# Patient Record
Sex: Female | Born: 1935
Health system: Southern US, Community
[De-identification: ages and names within clinical notes are randomized; demographics above are authoritative.]

## PROBLEM LIST (undated history)

## (undated) DIAGNOSIS — N189 Chronic kidney disease, unspecified: Secondary | ICD-10-CM

## (undated) DIAGNOSIS — H5440 Blindness, one eye, unspecified eye: Secondary | ICD-10-CM

## (undated) DIAGNOSIS — H3500 Unspecified background retinopathy: Secondary | ICD-10-CM

## (undated) DIAGNOSIS — H269 Unspecified cataract: Secondary | ICD-10-CM

## (undated) DIAGNOSIS — E119 Type 2 diabetes mellitus without complications: Secondary | ICD-10-CM

## (undated) DIAGNOSIS — I10 Essential (primary) hypertension: Secondary | ICD-10-CM

## (undated) HISTORY — PX: ABDOMINAL SURGERY: SHX537

## (undated) HISTORY — DX: Unspecified cataract: H26.9

## (undated) HISTORY — DX: Essential (primary) hypertension: I10

## (undated) HISTORY — DX: Chronic kidney disease, unspecified: N18.9

## (undated) HISTORY — DX: Type 2 diabetes mellitus without complications: E11.9

## (undated) HISTORY — DX: Blindness, one eye, unspecified eye: H54.40

## (undated) HISTORY — DX: Unspecified background retinopathy: H35.00

---

## 2007-02-10 ENCOUNTER — Emergency Department: Payer: Self-pay | Admitting: Emergency Medicine

## 2009-07-31 ENCOUNTER — Emergency Department: Payer: Self-pay | Admitting: Emergency Medicine

## 2009-09-30 ENCOUNTER — Ambulatory Visit: Payer: Self-pay | Admitting: Dermatology

## 2011-01-24 DIAGNOSIS — Z0000 Encounter for general adult medical examination without abnormal findings: Secondary | ICD-10-CM | POA: Diagnosis not present

## 2011-01-24 DIAGNOSIS — I10 Essential (primary) hypertension: Secondary | ICD-10-CM | POA: Diagnosis not present

## 2011-01-24 DIAGNOSIS — N289 Disorder of kidney and ureter, unspecified: Secondary | ICD-10-CM | POA: Diagnosis not present

## 2011-01-24 DIAGNOSIS — E785 Hyperlipidemia, unspecified: Secondary | ICD-10-CM | POA: Diagnosis not present

## 2011-01-24 DIAGNOSIS — E119 Type 2 diabetes mellitus without complications: Secondary | ICD-10-CM | POA: Diagnosis not present

## 2011-01-24 DIAGNOSIS — Z79899 Other long term (current) drug therapy: Secondary | ICD-10-CM | POA: Diagnosis not present

## 2011-01-31 DIAGNOSIS — R7309 Other abnormal glucose: Secondary | ICD-10-CM | POA: Diagnosis not present

## 2011-05-23 DIAGNOSIS — H2510 Age-related nuclear cataract, unspecified eye: Secondary | ICD-10-CM | POA: Diagnosis not present

## 2011-10-03 DIAGNOSIS — E1129 Type 2 diabetes mellitus with other diabetic kidney complication: Secondary | ICD-10-CM | POA: Diagnosis not present

## 2011-10-03 DIAGNOSIS — Z0000 Encounter for general adult medical examination without abnormal findings: Secondary | ICD-10-CM | POA: Diagnosis not present

## 2011-10-03 DIAGNOSIS — E785 Hyperlipidemia, unspecified: Secondary | ICD-10-CM | POA: Diagnosis not present

## 2011-10-03 DIAGNOSIS — I10 Essential (primary) hypertension: Secondary | ICD-10-CM | POA: Diagnosis not present

## 2011-10-10 DIAGNOSIS — A311 Cutaneous mycobacterial infection: Secondary | ICD-10-CM | POA: Diagnosis not present

## 2011-10-12 DIAGNOSIS — E119 Type 2 diabetes mellitus without complications: Secondary | ICD-10-CM | POA: Insufficient documentation

## 2011-10-12 DIAGNOSIS — E785 Hyperlipidemia, unspecified: Secondary | ICD-10-CM | POA: Insufficient documentation

## 2011-10-17 DIAGNOSIS — R3589 Other polyuria: Secondary | ICD-10-CM | POA: Diagnosis not present

## 2011-10-17 DIAGNOSIS — R7309 Other abnormal glucose: Secondary | ICD-10-CM | POA: Diagnosis not present

## 2011-10-17 DIAGNOSIS — R358 Other polyuria: Secondary | ICD-10-CM | POA: Diagnosis not present

## 2011-10-17 DIAGNOSIS — E119 Type 2 diabetes mellitus without complications: Secondary | ICD-10-CM | POA: Diagnosis not present

## 2011-10-17 DIAGNOSIS — I10 Essential (primary) hypertension: Secondary | ICD-10-CM | POA: Diagnosis not present

## 2011-10-19 ENCOUNTER — Ambulatory Visit: Payer: Self-pay | Admitting: Internal Medicine

## 2011-10-19 DIAGNOSIS — E119 Type 2 diabetes mellitus without complications: Secondary | ICD-10-CM | POA: Diagnosis not present

## 2011-10-19 DIAGNOSIS — Z7189 Other specified counseling: Secondary | ICD-10-CM | POA: Diagnosis not present

## 2011-10-31 DIAGNOSIS — Z1231 Encounter for screening mammogram for malignant neoplasm of breast: Secondary | ICD-10-CM | POA: Diagnosis not present

## 2011-10-31 DIAGNOSIS — E119 Type 2 diabetes mellitus without complications: Secondary | ICD-10-CM | POA: Diagnosis not present

## 2011-10-31 DIAGNOSIS — E785 Hyperlipidemia, unspecified: Secondary | ICD-10-CM | POA: Diagnosis not present

## 2011-10-31 DIAGNOSIS — I10 Essential (primary) hypertension: Secondary | ICD-10-CM | POA: Diagnosis not present

## 2011-11-05 DIAGNOSIS — Z13820 Encounter for screening for osteoporosis: Secondary | ICD-10-CM | POA: Diagnosis not present

## 2011-11-05 DIAGNOSIS — Z780 Asymptomatic menopausal state: Secondary | ICD-10-CM | POA: Diagnosis not present

## 2011-11-16 ENCOUNTER — Ambulatory Visit: Payer: Self-pay | Admitting: Internal Medicine

## 2011-11-16 DIAGNOSIS — Z7189 Other specified counseling: Secondary | ICD-10-CM | POA: Diagnosis not present

## 2011-11-16 DIAGNOSIS — E119 Type 2 diabetes mellitus without complications: Secondary | ICD-10-CM | POA: Diagnosis not present

## 2011-12-16 ENCOUNTER — Ambulatory Visit: Payer: Self-pay | Admitting: Internal Medicine

## 2012-04-09 DIAGNOSIS — A311 Cutaneous mycobacterial infection: Secondary | ICD-10-CM | POA: Diagnosis not present

## 2012-06-18 DIAGNOSIS — N058 Unspecified nephritic syndrome with other morphologic changes: Secondary | ICD-10-CM | POA: Diagnosis not present

## 2012-06-18 DIAGNOSIS — E1165 Type 2 diabetes mellitus with hyperglycemia: Secondary | ICD-10-CM | POA: Diagnosis not present

## 2012-06-18 DIAGNOSIS — E785 Hyperlipidemia, unspecified: Secondary | ICD-10-CM | POA: Diagnosis not present

## 2012-06-18 DIAGNOSIS — N182 Chronic kidney disease, stage 2 (mild): Secondary | ICD-10-CM | POA: Diagnosis not present

## 2012-06-18 DIAGNOSIS — E1129 Type 2 diabetes mellitus with other diabetic kidney complication: Secondary | ICD-10-CM | POA: Diagnosis not present

## 2013-04-10 DIAGNOSIS — I10 Essential (primary) hypertension: Secondary | ICD-10-CM | POA: Diagnosis not present

## 2013-04-10 DIAGNOSIS — K219 Gastro-esophageal reflux disease without esophagitis: Secondary | ICD-10-CM | POA: Diagnosis not present

## 2013-04-10 DIAGNOSIS — E1129 Type 2 diabetes mellitus with other diabetic kidney complication: Secondary | ICD-10-CM | POA: Diagnosis not present

## 2013-04-10 DIAGNOSIS — N058 Unspecified nephritic syndrome with other morphologic changes: Secondary | ICD-10-CM | POA: Diagnosis not present

## 2013-08-24 DIAGNOSIS — N183 Chronic kidney disease, stage 3 unspecified: Secondary | ICD-10-CM | POA: Diagnosis not present

## 2013-08-24 DIAGNOSIS — I10 Essential (primary) hypertension: Secondary | ICD-10-CM | POA: Diagnosis not present

## 2013-08-24 DIAGNOSIS — N058 Unspecified nephritic syndrome with other morphologic changes: Secondary | ICD-10-CM | POA: Diagnosis not present

## 2013-08-24 DIAGNOSIS — E1165 Type 2 diabetes mellitus with hyperglycemia: Secondary | ICD-10-CM | POA: Diagnosis not present

## 2013-08-24 DIAGNOSIS — E1129 Type 2 diabetes mellitus with other diabetic kidney complication: Secondary | ICD-10-CM | POA: Diagnosis not present

## 2013-12-14 DIAGNOSIS — M170 Bilateral primary osteoarthritis of knee: Secondary | ICD-10-CM | POA: Diagnosis not present

## 2013-12-14 DIAGNOSIS — E1129 Type 2 diabetes mellitus with other diabetic kidney complication: Secondary | ICD-10-CM | POA: Diagnosis not present

## 2013-12-14 DIAGNOSIS — I10 Essential (primary) hypertension: Secondary | ICD-10-CM | POA: Diagnosis not present

## 2013-12-14 DIAGNOSIS — E784 Other hyperlipidemia: Secondary | ICD-10-CM | POA: Diagnosis not present

## 2013-12-14 DIAGNOSIS — N183 Chronic kidney disease, stage 3 (moderate): Secondary | ICD-10-CM | POA: Diagnosis not present

## 2014-04-12 ENCOUNTER — Ambulatory Visit: Payer: Self-pay | Admitting: Family Medicine

## 2014-04-12 DIAGNOSIS — H6990 Unspecified Eustachian tube disorder, unspecified ear: Secondary | ICD-10-CM | POA: Diagnosis not present

## 2014-04-12 DIAGNOSIS — E119 Type 2 diabetes mellitus without complications: Secondary | ICD-10-CM | POA: Diagnosis not present

## 2014-04-12 DIAGNOSIS — N183 Chronic kidney disease, stage 3 (moderate): Secondary | ICD-10-CM | POA: Diagnosis not present

## 2014-04-12 DIAGNOSIS — E785 Hyperlipidemia, unspecified: Secondary | ICD-10-CM | POA: Diagnosis not present

## 2014-04-12 DIAGNOSIS — H6123 Impacted cerumen, bilateral: Secondary | ICD-10-CM | POA: Diagnosis not present

## 2014-04-12 DIAGNOSIS — L309 Dermatitis, unspecified: Secondary | ICD-10-CM | POA: Diagnosis not present

## 2014-04-12 DIAGNOSIS — I10 Essential (primary) hypertension: Secondary | ICD-10-CM | POA: Diagnosis not present

## 2014-04-12 DIAGNOSIS — E1129 Type 2 diabetes mellitus with other diabetic kidney complication: Secondary | ICD-10-CM | POA: Diagnosis not present

## 2014-04-12 DIAGNOSIS — Z79899 Other long term (current) drug therapy: Secondary | ICD-10-CM | POA: Diagnosis not present

## 2014-06-09 DIAGNOSIS — M10071 Idiopathic gout, right ankle and foot: Secondary | ICD-10-CM | POA: Diagnosis not present

## 2014-06-09 DIAGNOSIS — M79671 Pain in right foot: Secondary | ICD-10-CM | POA: Diagnosis not present

## 2014-06-27 ENCOUNTER — Other Ambulatory Visit: Payer: Self-pay | Admitting: Internal Medicine

## 2014-06-27 DIAGNOSIS — L430 Hypertrophic lichen planus: Secondary | ICD-10-CM | POA: Insufficient documentation

## 2014-06-27 DIAGNOSIS — I10 Essential (primary) hypertension: Secondary | ICD-10-CM | POA: Insufficient documentation

## 2014-06-27 DIAGNOSIS — E785 Hyperlipidemia, unspecified: Secondary | ICD-10-CM

## 2014-06-27 DIAGNOSIS — E119 Type 2 diabetes mellitus without complications: Secondary | ICD-10-CM | POA: Insufficient documentation

## 2014-06-27 DIAGNOSIS — K219 Gastro-esophageal reflux disease without esophagitis: Secondary | ICD-10-CM | POA: Insufficient documentation

## 2014-06-27 DIAGNOSIS — M1710 Unilateral primary osteoarthritis, unspecified knee: Secondary | ICD-10-CM | POA: Insufficient documentation

## 2014-06-27 DIAGNOSIS — E1169 Type 2 diabetes mellitus with other specified complication: Secondary | ICD-10-CM | POA: Insufficient documentation

## 2014-06-27 DIAGNOSIS — M179 Osteoarthritis of knee, unspecified: Secondary | ICD-10-CM | POA: Insufficient documentation

## 2014-06-28 ENCOUNTER — Encounter: Payer: Self-pay | Admitting: Internal Medicine

## 2014-06-30 DIAGNOSIS — H43313 Vitreous membranes and strands, bilateral: Secondary | ICD-10-CM | POA: Diagnosis not present

## 2014-07-05 DIAGNOSIS — M10071 Idiopathic gout, right ankle and foot: Secondary | ICD-10-CM | POA: Diagnosis not present

## 2014-08-09 ENCOUNTER — Ambulatory Visit: Payer: Self-pay | Admitting: Internal Medicine

## 2014-08-16 ENCOUNTER — Other Ambulatory Visit: Payer: Self-pay | Admitting: Internal Medicine

## 2014-08-25 ENCOUNTER — Other Ambulatory Visit: Payer: Self-pay | Admitting: Internal Medicine

## 2014-08-30 ENCOUNTER — Other Ambulatory Visit: Payer: Self-pay | Admitting: Internal Medicine

## 2014-10-08 ENCOUNTER — Other Ambulatory Visit: Payer: Self-pay | Admitting: Internal Medicine

## 2014-12-17 ENCOUNTER — Other Ambulatory Visit: Payer: Self-pay | Admitting: Internal Medicine

## 2014-12-20 ENCOUNTER — Encounter: Payer: Self-pay | Admitting: Internal Medicine

## 2014-12-22 ENCOUNTER — Ambulatory Visit: Payer: Self-pay | Admitting: Internal Medicine

## 2015-01-03 ENCOUNTER — Ambulatory Visit: Payer: Self-pay | Admitting: Internal Medicine

## 2015-01-03 ENCOUNTER — Other Ambulatory Visit: Payer: Self-pay | Admitting: Internal Medicine

## 2015-01-03 ENCOUNTER — Encounter: Payer: Self-pay | Admitting: Internal Medicine

## 2015-01-05 ENCOUNTER — Other Ambulatory Visit: Payer: Self-pay | Admitting: Internal Medicine

## 2015-01-07 ENCOUNTER — Ambulatory Visit (INDEPENDENT_AMBULATORY_CARE_PROVIDER_SITE_OTHER): Payer: Medicare Other | Admitting: Internal Medicine

## 2015-01-07 ENCOUNTER — Encounter: Payer: Self-pay | Admitting: Internal Medicine

## 2015-01-07 VITALS — BP 138/80 | HR 80 | Ht 65.5 in | Wt 175.8 lb

## 2015-01-07 DIAGNOSIS — I10 Essential (primary) hypertension: Secondary | ICD-10-CM

## 2015-01-07 DIAGNOSIS — E1122 Type 2 diabetes mellitus with diabetic chronic kidney disease: Secondary | ICD-10-CM | POA: Insufficient documentation

## 2015-01-07 DIAGNOSIS — Z7409 Other reduced mobility: Secondary | ICD-10-CM | POA: Insufficient documentation

## 2015-01-07 DIAGNOSIS — E1165 Type 2 diabetes mellitus with hyperglycemia: Secondary | ICD-10-CM

## 2015-01-07 DIAGNOSIS — N183 Chronic kidney disease, stage 3 unspecified: Secondary | ICD-10-CM

## 2015-01-07 DIAGNOSIS — E119 Type 2 diabetes mellitus without complications: Secondary | ICD-10-CM | POA: Diagnosis not present

## 2015-01-07 DIAGNOSIS — E118 Type 2 diabetes mellitus with unspecified complications: Secondary | ICD-10-CM | POA: Insufficient documentation

## 2015-01-07 MED ORDER — SITAGLIPTIN PHOS-METFORMIN HCL 50-500 MG PO TABS: 1.0000 | ORAL_TABLET | Freq: Every day | ORAL | Status: DC
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
## 2015-01-07 NOTE — Patient Instructions (Signed)
Begin taking janumet 50/500 mg once a day IN PLACE OF Metformin.

## 2015-01-08 ENCOUNTER — Other Ambulatory Visit: Payer: Self-pay | Admitting: Internal Medicine

## 2015-01-08 LAB — COMPREHENSIVE METABOLIC PANEL
ALBUMIN: 4.3 g/dL (ref 3.5–4.7)
ALT: 18 IU/L (ref 0–32)
AST: 13 IU/L (ref 0–40)
Albumin/Globulin Ratio: 1.4 (ref 1.1–2.5)
Alkaline Phosphatase: 96 IU/L (ref 39–117)
BUN / CREAT RATIO: 18 (ref 11–26)
BUN: 20 mg/dL (ref 8–27)
Bilirubin Total: 0.4 mg/dL (ref 0.0–1.2)
CHLORIDE: 101 mmol/L (ref 96–106)
CO2: 24 mmol/L (ref 18–29)
CREATININE: 1.11 mg/dL — AB (ref 0.57–1.00)
Calcium: 9.4 mg/dL (ref 8.7–10.3)
GFR calc Af Amer: 53 mL/min/{1.73_m2} — ABNORMAL LOW (ref >59)
GFR calc non Af Amer: 46 mL/min/{1.73_m2} — ABNORMAL LOW (ref >59)
Globulin, Total: 3.1 g/dL (ref 1.5–4.5)
Glucose: 281 mg/dL — ABNORMAL HIGH (ref 65–99)
Potassium: 4.7 mmol/L (ref 3.5–5.2)
Sodium: 140 mmol/L (ref 134–144)
TOTAL PROTEIN: 7.4 g/dL (ref 6.0–8.5)

## 2015-01-08 LAB — CBC WITH DIFFERENTIAL/PLATELET
BASOS ABS: 0.1 10*3/uL (ref 0.0–0.2)
Basos: 1 %
EOS (ABSOLUTE): 0.3 10*3/uL (ref 0.0–0.4)
EOS: 3 %
HEMATOCRIT: 37.7 % (ref 34.0–46.6)
HEMOGLOBIN: 12.2 g/dL (ref 11.1–15.9)
IMMATURE GRANS (ABS): 0 10*3/uL (ref 0.0–0.1)
Immature Granulocytes: 0 %
LYMPHS ABS: 2.3 10*3/uL (ref 0.7–3.1)
LYMPHS: 27 %
MCH: 26.6 pg (ref 26.6–33.0)
MCHC: 32.4 g/dL (ref 31.5–35.7)
MCV: 82 fL (ref 79–97)
MONOCYTES: 7 %
Monocytes Absolute: 0.6 10*3/uL (ref 0.1–0.9)
NEUTROS ABS: 5.3 10*3/uL (ref 1.4–7.0)
Neutrophils: 62 %
Platelets: 260 10*3/uL (ref 150–379)
RBC: 4.59 x10E6/uL (ref 3.77–5.28)
RDW: 15.7 % — ABNORMAL HIGH (ref 12.3–15.4)
WBC: 8.6 10*3/uL (ref 3.4–10.8)

## 2015-01-08 LAB — TSH: TSH: 2 u[IU]/mL (ref 0.450–4.500)

## 2015-01-08 LAB — HEMOGLOBIN A1C
Est. average glucose Bld gHb Est-mCnc: 255 mg/dL
Hgb A1c MFr Bld: 10.5 % — ABNORMAL HIGH (ref 4.8–5.6)

## 2015-01-10 ENCOUNTER — Other Ambulatory Visit: Payer: Self-pay | Admitting: Internal Medicine

## 2015-01-12 ENCOUNTER — Other Ambulatory Visit: Payer: Self-pay | Admitting: *Deleted

## 2015-01-12 NOTE — Patient Outreach (Signed)
[facility]) Care Management  01/12/2015  Sheri Ryan [birth year] [id number]  Subjective:  Telephone call to patient's home, left HIPAA compliant message with Sheri Ryan, requesting call back.   Sheri Ryan states patient is currently not home, will be back later this evening.    Objective: Per Epic chart review.   Patient's has visit with primary care MD was on 01/07/15.   Per MD note: Impaired functional mobility, balance, gait, and endurance, use cane for balance and safety.   May need Physical therapy referral.  Assessment: Received MD referral from Dr. Halina Ryan on 01/07/15.  Referral states, patient has type 2 diabetes mellitus without complication, without long-term current use of insulin ([place]), chronic kidney disease  (CKD), stage III (moderate), impaired functional mobility, balance, gait, and endurance.  Plan: RNCM will call patient back within 3 business days for telephonic screening , if no return call from patient .   Sheri Ryan H. Annia Friendly, BSN, [place] Management [facility] Telephonic CM Phone: [phone] Fax: [phone]

## 2015-01-13 ENCOUNTER — Ambulatory Visit: Payer: Self-pay | Admitting: *Deleted

## 2015-01-13 ENCOUNTER — Other Ambulatory Visit: Payer: Self-pay | Admitting: Internal Medicine

## 2015-01-13 ENCOUNTER — Other Ambulatory Visit: Payer: Self-pay | Admitting: *Deleted

## 2015-01-13 NOTE — Patient Outreach (Addendum)
[facility]) Care Management  01/13/2015  Sheri Ryan [birth year] [id number]   Subjective: Telephone call from patient, states she is returning RNCM's call.   HIPAA verified.  Discussed [facility] Care Management services.  Patient had questions regarding the place [facility] services would be rendered and if she would still be able to see her current providers.  RNCM advised [facility] services if accepted could be rendered in patient's home or via telephone.   RNCM advised patient that [facility] program participation would not be interfere with the care she is currently receiving from her primary care MD, [facility] [place] [facility], since she has traditional Medicare she can continue to see providers as long as they accept Medicare.   Patient voices understanding and is in agreement to receive additional telephonic outreach.   Patient states she would like to hear more about the program and request a call back on 01/21/15 to discuss further prior to agreeing to participate in the [facility] program.   States is currently on her way out and will be extremely busy over the next few days.   Objective: Per Epic chart review. Patient's has visit with primary care MD was on 01/07/15. Per MD note: Impaired functional mobility, balance, gait, and endurance, use cane for balance and safety. May need Physical therapy referral.  Assessment: Received MD referral from Dr. Halina Maidens on 01/07/15. Referral states, patient has type 2 diabetes mellitus without complication, without long-term current use of insulin (Sheri Ryan), chronic kidney disease (CKD), stage III (moderate), impaired functional mobility, balance, gait, and endurance.  Patient interested in the program and would like to discuss with RNCM at a later time.    Plan: RNCM will call patient back within 1 week for completion of  telephonic screening.    Carolos Fecher H. Annia Friendly, BSN, [place] [facility] Telephonic CM Phone:  [phone] Fax: [phone]

## 2015-01-20 ENCOUNTER — Telehealth: Payer: Self-pay

## 2015-01-20 NOTE — Telephone Encounter (Signed)
Patient called with questions about medications, she was told to continue taking medications as prescribed and was told to call if blood sugars drop too low.

## 2015-01-21 ENCOUNTER — Other Ambulatory Visit: Payer: Self-pay | Admitting: *Deleted

## 2015-01-21 NOTE — Patient Outreach (Addendum)
Sheri Ryan February 17, 1929 [id number]  Subjective: Telephone call to patient's home number, no answer, left HIPAA compliant voice mail message and requested call back. Telephone call to patient's mobile number, no answer, left HIPAA compliant voice mail message and requested call back. Telephone call to patient's primary MD's office (Dr. Halina Maidens), spoke with Sheri Ryan and then transferred to [facility].   HIPAA verified.  Sheri Ryan states she sent the [facility] referral on Dr. Gaspar Cola behalf.  RNCM advised Sheri Ryan that RNCM has made 3 attempts to engage the patient with [facility] services and will send the patient unsuccessful to reach letter, [facility] pamphlet and magnet.  Sheri Ryan states she will put an update note in their system, advising of her conversation with [facility] and request for patient to call [facility] for engagement follow up for diabetes education and community resources.   Received voice mail message from patient states she is returning [facility] call and would like a call back to reschedule telephone appointment. Telephone call to patient's cell phone number.  Spoke with patient and HIPAA verified.   Patient states she received a call from MD's office and her A1C is 10.  States she does not like her A1C being so high and wants to receive diabetes education.   Patient gave verbal authorization for RNCM to speak with her husband Sheri Ryan) regarding her healthcare needs.   Patient request call back on 01/24/15 to complete [facility] Care Management services enrollment.   Patient in agreement to receive [place] [facility] services.    Objective: Per Epic chart review. Patient's has visit with primary care MD was on 01/07/15. Per MD note: Impaired functional mobility, balance, gait, and endurance, use cane for balance and safety. May need Physical therapy referral.  Assessment: Received MD referral from Dr. Halina Maidens on 01/07/15. Referral  states, patient has type 2 diabetes mellitus without complication, without long-term current use of insulin ([place]), chronic kidney disease (CKD), stage III (moderate), impaired functional mobility, balance, gait, and endurance. Patient interested in the [facility] program and would like to enroll in the program on 01/24/15.   Plan: RNCM will call patient's primary MD, advised of engagement attempts and request assistance with engagement.  RNCM will send patient successful outreach attempt letter and [place] packet once enrollment completed.   RNCM will call patient on 01/24/15 for telephone screening completion.    Sheri Ryan H. Annia Friendly, BSN, [place] Management [facility] Telephonic CM Phone: [phone] Fax: [phone]

## 2015-01-24 ENCOUNTER — Other Ambulatory Visit: Payer: Self-pay | Admitting: *Deleted

## 2015-01-24 DIAGNOSIS — E119 Type 2 diabetes mellitus without complications: Secondary | ICD-10-CM

## 2015-01-24 NOTE — Patient Outreach (Signed)
[facility]) Care Management  01/24/2015  Keerthi Westfall 03-19-1929 [id number]  Subjective: Telephone call to patient's cell phone number, per her request, no answer, and left HIPAA compliant voice mail message requesting call back.  Objective: Per Epic chart review. Patient's has visit with primary care MD was on 01/07/15. Per MD note: Impaired functional mobility, balance, gait, and endurance, use cane for balance and safety. May need Physical therapy referral.  Assessment: Received MD referral from Dr. Halina Maidens on 01/07/15. Referral states, patient has type 2 diabetes mellitus without complication, without long-term current use of insulin ([place]), chronic kidney disease (CKD), stage III (moderate), impaired functional mobility, balance, gait, and endurance. Per last conversation with patient interested in the [facility] program and would like to enroll in the program on 01/24/15.   Plan: RNCM will call patient within 3 business days for 2nd attempt to complete telephone screening after patient re-contact initiation, if no return call.    Alacia Rehmann H. Annia Friendly, BSN, [place] Management [facility] Telephonic CM Phone: [phone] Fax: [phone]

## 2015-01-24 NOTE — Patient Outreach (Signed)
[facility]) Care Management  01/24/2015  Sheri Ryan [birth year] [id number]   Subjective: Telephone call from patient, states she is following up on telephonic appointment.   States she missed RNCM's call earlier this morning.   RNCM advised patient that she will call patient back in approximately 15 minutes, currently finishing up with another patient.  Patient states she will be home all day and RNCM can call back whenever convenient. Telephone to patient, HIPAA verified.   Discussed [facility] Care Management services, patient verbalizes understanding and is in agreement to receive services. Patient states she is doing well and is in need of [name] services.  Patient states she is needing education on diabetes, managing her blood sugars, managing the effects of her new diabetes medication, and what to do when her blood sugar drops.  Patient states her primary MD, Dr. Halina Maidens told patient to call her anytime her blood sugar drops.  RNCM advised patient of some additional strategies for low blood sugar monitoring and management.  Patient given [name] 24 hour Nurse line phone number as an additional resource.  Patient states she gets lightheaded, a nervous stomach and very weak when her blood sugar drops.    States she was started on a new pill for her diabetes (Janumet 1 tablet once a day) and it is bringing her blood sugars down but she is not use to the low blood sugar feeling.   Patient states her A1C is currently 10, wants it to be lower and does not want to go on insulin.    States her blood sugar range prior to new medication was in the 200.  States her blood sugars are currently ranging 66 - 160.   Patient is agreement to [name] consult for medication review, medication questions regarding new diabetes medication and side effect discussion.   Patient states she is able to afford her medications.   Patient states is still driving and has no  transportation needs at this time. RNCM educated patient on the criteria for home health versus outpatient therapy. Patient states she would like assistance with obtaining balance exercise class community resource information and care coordination.   States she is not eligible for [name] program through her insurance company and has thought about joining an exercise class at the local senior center.  Patient in agreement with referral to [name] for home safety evaluation, diabetes management, diabetes education, care coordination of community balance exercise program, and fall prevention education.  Patient states she does not want in home physical therapy since is so active in the community.   Patient states she has had approximately 6 falls over the past year and the recent fall (November of 2016)  occurred in [place] while visiting a friend in the hospital.   Patient states she did not seek medical care at the ED for this fall even though she had soreness to her right side.   States she is doing much better and treated the right side soreness with heating pads and rest.   Patient states her falls are due to lose of balance and decrease confidence in walking because she has to be so careful of falling.  Patient states she also has hypertension and is taking medications as prescribed.  Her blood pressure range is 160/80 and she has not been given a target range by her primary MD.   Patient states she wanting to resume care with an endocrinologist for her diabetes.  States she will continue to use her primary care Dr. Army Melia for primary care needs after she find a new endocrinologist.  States she has seen a endocrinologist at [facility] in the past but the physician has left the practice.   Patient states she will call the [place] endocrinologist office for a referral to a new MD and advised RNCM if assistance is needed.   Patient states she is also in the process of obtaining a  Dermatologist  to follow up on the skin rashes that have been getting progressively worse over the last couple of years.   RNCM advised patient that she asked Community RNCM to bring patient a [name] Patient calendar during home visit.  Objective: Per Epic chart review. Patient's has visit with primary care MD was on 01/07/15. Per MD note: Impaired functional mobility, balance, gait, and endurance, use cane for balance and safety. May need Physical therapy referral.  Assessment: Received MD referral from Dr. Halina Maidens on 01/07/15. Referral states, patient has type 2 diabetes mellitus without complication, without long-term current use of insulin ([place]), chronic kidney disease (CKD), stage III (moderate), impaired functional mobility, balance, gait, and endurance. Per last conversation with patient interested in the [facility] program and would like to enroll in the program on 01/24/15.  Patient has medication questions, need diabetes education, diabetes management assistance and community resources for balance exercise program.   Patient has no telephonic care coordination needs at this time.   Plan: RNCM will refer patient to [facility]  for home safety evaluation, diabetes management, diabetes education, care coordination of community balance exercise program, and fall prevention education.  RNCM will refer patient to [place] for medication review, medication questions regarding new diabetes medication and side effect discussion.    Leata Dominy H. Annia Friendly, BSN, [place] Management [facility] Telephonic CM Phone: [phone] Fax: [phone]

## 2015-01-25 ENCOUNTER — Other Ambulatory Visit: Payer: Self-pay | Admitting: *Deleted

## 2015-01-25 ENCOUNTER — Ambulatory Visit: Payer: Self-pay | Admitting: *Deleted

## 2015-01-25 NOTE — Patient Outreach (Signed)
Unsuccessful outreach for [facility] screening. HIPAA compliant message left.   Plan: RNCM will await return call  RNCM will attempt outreach again in 2 days.  Rutherford Limerick RN, BSN  [facility] Care Management [phone])

## 2015-01-31 ENCOUNTER — Other Ambulatory Visit: Payer: Self-pay | Admitting: *Deleted

## 2015-01-31 NOTE — Patient Outreach (Signed)
RNCM received a call back from pt. Pt discussed that she was very independent and goes out every day but would like to be involved in a support group or education classes to support her with her diabetes. Pt also stated she would like to participate in an exercise program to assist with her balance and strength. Pt stated she has fallen related to her balance. Pt stated her provider was aware of of her falls. RNCM made a plan to visit pt Friday.   Plan: RNCM will visit pt Friday 02/04/15  Joretta Eads RN, BSN  [facility] Care Management [phone])

## 2015-01-31 NOTE — Patient Outreach (Signed)
Unsuccessful outreach for [facility] screening. HIPAA compliant message left.   Plan: RNCM will await return call  RNCM will attempt outreach again in 2 days.   Rutherford Limerick RN, BSN  [facility] Care Management [phone])

## 2015-02-04 ENCOUNTER — Ambulatory Visit: Payer: Self-pay | Admitting: *Deleted

## 2015-02-04 ENCOUNTER — Other Ambulatory Visit: Payer: Self-pay | Admitting: Pharmacist

## 2015-02-04 NOTE — Patient Outreach (Signed)
Sheri Ryan was referred to pharmacy for medication management related to questions that the patient has about her diabetes medications. Left a HIPAA compliant message on the patient's voicemail. If have not heard from patient by next week, will give her another call at that time.  Harlow Asa, PharmD Clinical Pharmacist [place] Management [phone]

## 2015-02-07 ENCOUNTER — Other Ambulatory Visit: Payer: Self-pay | Admitting: *Deleted

## 2015-02-07 NOTE — Patient Outreach (Signed)
RNCM called pt to re-set up initial visit related to previous visit had to be cancelled related to an unforeseen emergency. RNCM left a HIPAA compliant message on pt's preferred number voice mail.  Plan: Will await for pt to call back.  RNCM will attempt to reach pt again this week.   Rutherford Limerick RN, BSN  [facility] Care Management [phone])

## 2015-02-10 ENCOUNTER — Other Ambulatory Visit: Payer: Self-pay | Admitting: *Deleted

## 2015-02-10 NOTE — Patient Outreach (Signed)
Unsuccessful outreach to reschedule home visit. HIPAA compliant message left.   Plan: RNCM will await return call  RNCM will attempt outreach again in 2 days.  Rutherford Limerick RN, BSN  [facility] Care Management [phone])

## 2015-02-11 ENCOUNTER — Other Ambulatory Visit: Payer: Self-pay | Admitting: *Deleted

## 2015-02-11 ENCOUNTER — Other Ambulatory Visit: Payer: Self-pay | Admitting: Pharmacist

## 2015-02-11 NOTE — Patient Outreach (Signed)
RNCM called pt to reschedule home visit. HIPAA information verified. RNCM and pt discussed EMMI education, Diabetic classes, and home visit. RNCM and pt discussed low blood sugars and RNCM reinforced education taught to pt by Harlow Asa [facility]-Pharmacist. Pt very complimentary of pharmacist and her assistance.    Plan: RNCM will visit pt in her home Monday 02/14/15 @ 12:30 RNCM will send pt EMMI education to provided email. [email or web address] RNCM will call pt's MD and request diabetic classes through the lifestyle center.  Rutherford Limerick RN, BSN  [facility] Care Management [phone])

## 2015-02-11 NOTE — Patient Outreach (Signed)
Sheri Ryan was referred to pharmacy for medication management related to questions that the patient has about her diabetes medications.   Called and spoke with Sheri Ryan who reports that she takes both glipizide and Janumet, as documented in [place]. States that she sometimes gets nervous about whether her blood sugar goes low. Reports that she checks her blood sugar twice daily. Reports that she feels symptoms when her blood sugar gets down to around 100 mg/dL or below. Reports that occasionally she will skip taking her medication if her blood sugar is close to 100 mg/dL in the morning, but that she has not told her PCP about this. Patient states that she does not know what her blood sugar goals or A1C goal should be. States that her blood sugar has improved significantly since Dr. Army Melia started her on Janumet, going from ranging in the 200s to now being in the 100s. Patient reports that she does not currently write down her blood sugars. Patient reports that she has an upcoming appointment with Dr. Army Melia in March.  Counsel patient about the importance of communicating with her PCP. Counsel patient about how to keep a log of her blood sugars and in this log to create a column for comments in which she can record notes about symptoms that she might have felt. Encourage patient to call Dr. Army Melia to let her know about her concerns about lows and to discuss what her current blood sugar goals should be. Counsel patient that if she ever feels like she needs to skip her medication, she should call and discuss this with Dr. Army Melia so that they can discuss whether an adjustment is needed. Patient states that she agrees with this plan and that she will call Dr. Army Melia to discuss these goals and concerns.  Discuss treating low blood sugars with the patient. Patient describes over-treating lows. Stating that if her blood sugar gets under 100 mg/dL, she will drink a lot of orange juice or soda.  Counsel patient about how to treat a low, using half a glass of orange juice or regular soda, rechecking in 15 minutes, repeating as needed, and about how to have a carbohydrate-protein snack or meal to maintain her blood sugar if back in normal range. Patient verbalizes understanding. Patient reports that she has glucose tablets, but that she does not carry them with her. Encourage patient to do so in case she has a low outside of the house. Patient states that she will.  Sheri Ryan states that she has no further questions for me at this time. Provide the patient with my phone number. Will close pharmacy episode for now.   Harlow Asa, PharmD Clinical Pharmacist [facility] [phone]

## 2015-02-14 ENCOUNTER — Other Ambulatory Visit: Payer: Self-pay | Admitting: Internal Medicine

## 2015-02-14 ENCOUNTER — Encounter: Payer: Self-pay | Admitting: *Deleted

## 2015-02-14 ENCOUNTER — Other Ambulatory Visit: Payer: Self-pay | Admitting: *Deleted

## 2015-02-14 VITALS — BP 166/90 | HR 93 | Resp 18 | Ht 65.0 in | Wt 175.0 lb

## 2015-02-14 DIAGNOSIS — Z79899 Other long term (current) drug therapy: Secondary | ICD-10-CM

## 2015-02-14 DIAGNOSIS — E119 Type 2 diabetes mellitus without complications: Secondary | ICD-10-CM

## 2015-02-14 DIAGNOSIS — E1169 Type 2 diabetes mellitus with other specified complication: Secondary | ICD-10-CM

## 2015-02-14 DIAGNOSIS — E785 Hyperlipidemia, unspecified: Secondary | ICD-10-CM

## 2015-02-14 NOTE — Patient Outreach (Signed)
[facility]) Care Management   02/14/2015  Sheri Ryan [birth year] [id number]  Sheri Ryan is an 82 y.o. female  Subjective: "I really want to learn more about how to control my diabetes." "I don't take this simvastatin/Zocor any more." "I would really like to be a part of an exercise class with people dealing with the same things as me."    Objective:  Blood pressure 166/90, pulse 93, resp. rate 18, height 1.651 m (5\' 5" ), weight 175 lb (79.379 kg), SpO2 97 %. Pt had not taken morning medications. Review of Systems  Musculoskeletal: Positive for falls and neck pain.       Left side   Skin: Positive for rash.  All other systems reviewed and are negative.   Physical Exam  Constitutional: She is oriented to person, place, and time. She appears well-developed and well-nourished.  Neck: Muscular tenderness present.  Pt c/o muscular tenderness to left side of neck.  Cardiovascular: Normal rate and regular rhythm.   Pulses:      Radial pulses are 2+ on the right side, and 2+ on the left side.       Dorsalis pedis pulses are 2+ on the right side, and 2+ on the left side.  Respiratory: Effort normal and breath sounds normal.  GI: Soft. Normal appearance and bowel sounds are normal.  Musculoskeletal: Normal range of motion.  Neurological: She is alert and oriented to person, place, and time.  Skin: Skin is warm and dry. Rash noted.     Psychiatric: She has a normal mood and affect. Her speech is normal and behavior is normal. Judgment and thought content normal. Cognition and memory are normal.    Current Medications:   Current Outpatient Prescriptions  Medication Sig Dispense Refill  . amLODipine (NORVASC) 10 MG tablet TAKE 1 TABLET BY MOUTH ONCE DAILY 30 tablet 5  . BYSTOLIC 10 MG tablet TAKE 1 TABLET BY MOUTH EVERY DAY 30 tablet 2  . glipiZIDE (GLUCOTROL XL) 10 MG 24 hr tablet TAKE 1 TABLET BY MOUTH EVERY 24 HOURS 30 tablet 3  . glucose  blood (PRODIGY NO CODING BLOOD GLUC) test strip PRODIGY NO CODING BLOOD GLUC (In Vitro Strip)  1 (one) Strip Strip daily for 30 days  Quantity: 30;  Refills: 5   Ordered :18-June-2012  [name] ;  Started 08-Oct-2011 Active Comments: Dx: 250.00    . lisinopril (PRINIVIL,ZESTRIL) 5 MG tablet TAKE 1 TABLET BY MOUTH DAILY. 30 tablet 1  . sitaGLIPtin-metformin (JANUMET) 50-500 MG tablet Take 1 tablet by mouth daily. 30 tablet 0  . simvastatin (ZOCOR) 20 MG tablet Take 1 tablet by mouth daily. Reported on 02/14/2015     No current facility-administered medications for this visit.    Functional Status:   In your present state of health, do you have any difficulty performing the following activities: 01/24/2015 01/24/2015  Hearing? [place]? Y -  Difficulty concentrating or making decisions? - -  Walking or climbing stairs? (No Data) Y  Dressing or bathing? N -  Doing errands, shopping? - N    Fall/Depression Screening:    PHQ 2/9 Scores 01/24/2015  PHQ - 2 Score 0   Fall Risk  01/24/2015 01/24/2015  Falls in the past year? (No Data) Yes  Number falls in past yr: (No Data) 2 or more  Injury with Fall? - Yes  Risk Factor Category  - High Fall Risk  Risk for fall due to : - History of  fall(s)  Follow up Education provided;Falls prevention discussed -    Assessment:  Dr. Army Melia sent an in-basket making her aware of pt's rash, medication discrepancies and a request for a referral to [facility] for diabetic teaching classes. Pt home in good repair. Pt confided she is raising her 4 year old grandson which at times causes her some stress. Spouse present for part of the visit and spouse reports he is supportive. Pt active in her local church and states this is a great support for her but would like to be able to find a group of ladies to discuss things with her own age. RNCM gave pt resources on [facility] and classes for seniors at [employer]. Pt had  completed the EMMI education sent to through email and was interested in the diabetes classes provided by the [place] [facility] at [facility]. Pt's b/p noted to be high but pt reported she had not taken her b/p medications this am. Pt did report some affordability issues of some of her medications. Pharmacy consult placed to review this issue. RNCM placed a call to a local gym to see if pt was eligible for silver and fit classes. RNCM also reviewed pt's insurance web site to see if there was an exercise benefit. So far, unable to tell if this is a benefit. Pt encouraged to start logging her sugars for her MD. RNCM also discussed with pt lows and highs.  Pt reported her MD ordered PT for her related to falls about 6 months ago and the PT office called and said they would call back but they never did. RNCM let pt know she would investigate this.    RNCM made a plan with pt to call her in a few days to discuss pt progress.   Plan: RNCM will call pt in a few days to discuss pt's progress. RNCM will f/u with MD office about pt's medications, skin issue, referral to DM classes and possibly another PT referral.  RNCM will place a pharmacy consult related to pt med costs  Alfreddie Consalvo RN, BSN  [facility] Care Management [phone])  [facility] CM Care Plan Problem One        Most Recent Value   Care Plan Problem One  Pt unsure of herself when caring for her diabetes    Role Documenting the Problem One  Care Management [place] for Problem One  Active   [facility] Long Term Goal (31-90 days)  Pt wil reduce a1c to 7 in the next 90 days   [facility] Long Term Goal Start Date  02/14/15   Interventions for Problem One Long Term Goal  RNCM made initial home visit. RNCM sent pt EMMI education on carb counting, and DM. RNCM requested  diabetic classes from [facility].   [facility] CM Short Term Goal #1 (0-30 days)  Pt will attend diabetic classes at the [facility] center in the next 30 days   [facility] CM Short Term Goal #1 Start Date   02/14/15   Interventions for Short Term Goal #1  RNCM requested referral for classes from MD   [facility] CM Short Term Goal #2 (0-30 days)  Pt will have medication questions related to zocor and daily aspirin in the next 30 days    [facility] CM Short Term Goal #2 Start Date  02/14/15   Interventions for Short Term Goal #2  RNCM did med review with pt with bottles present and noted pt was not  taking zocor. RNCM educated on the purpose of this medication. RNCM in basketed MD to make her aware pt was not taking zocor even though it was on her medication in list.

## 2015-02-17 ENCOUNTER — Encounter: Payer: Self-pay | Admitting: *Deleted

## 2015-02-28 ENCOUNTER — Other Ambulatory Visit: Payer: Self-pay

## 2015-02-28 MED ORDER — SIMVASTATIN 20 MG PO TABS: 20.0000 mg | ORAL_TABLET | Freq: Every day | ORAL | Status: DC

## 2015-02-28 NOTE — Telephone Encounter (Signed)
Received fax from pharmacy.

## 2015-03-02 ENCOUNTER — Other Ambulatory Visit: Payer: Self-pay | Admitting: Pharmacist

## 2015-03-02 ENCOUNTER — Other Ambulatory Visit: Payer: Self-pay | Admitting: *Deleted

## 2015-03-02 DIAGNOSIS — E119 Type 2 diabetes mellitus without complications: Secondary | ICD-10-CM

## 2015-03-02 NOTE — Patient Outreach (Signed)
RNCM contacted pt to make her aware that her insurance covered a gym membership refund process and gave her the information about this. Pt noted she was planning to go to the diabetic teaching classes. RNCM talked with pt about [facility]-Health coach and pt was interested.    Plan: Order for [place].  Rutherford Limerick RN, BSN  [facility] Care Management [phone])

## 2015-03-02 NOTE — Patient Outreach (Signed)
Received a message from Nurse Care Manager Janci Minor asking me to reach out to Mrs. Teachey regarding medication assistance. Per the referral, patient is concerned about the cost of her Bystolic and Janumet. In addition, this referral states that patient has Zocor on her medication list, but reports that she has not been on this for a while.  Call to follow up with Mrs. Alroy Dust. Mrs. Boehler reports that she currently pays about $46/month each for her Bystolic and Janumet. Discuss with patient generic alternatives to Bystolic, such as metoprolol succinate and bisoprolol. Offer to discuss these alternatives with Dr. Army Melia for the patient. However, patient reports that she has been on Bystolic for years, originally prescribed by her Cardiologist in [place], and does not wish to change the medication at this time. Also discuss with patient alternatives to the Janumet, such as talking to Dr. Army Melia about having the patient take the first line agent, metformin, on its own and titrate up to the target dose of this without the Januvia component. However, Mrs. Wonser states that she would really prefer to not change this medication either as she feels comfortable with it now.  Briefly discuss medication assistance programs with the patient. However, she reports that she and her husband have an annual household income of about $70,000. Patient states that she will stay on both the [place] for now, as she can afford both.   Patient also reports that Dr. Army Melia has sent in a prescription for her for simvastatin to her pharmacyand so she is now resuming this medication.  Patient reports that she has no further questions for me at this time and confirms that she has my phone number. Will close pharmacy episode at this time.  Harlow Asa, PharmD Clinical Pharmacist [facility] [phone]

## 2015-03-16 ENCOUNTER — Other Ambulatory Visit: Payer: Self-pay | Admitting: *Deleted

## 2015-03-16 NOTE — Patient Outreach (Signed)
[facility]) Care Management  03/16/2015  Sheri Ryan 09-22-1929 [id number]   RN Health Coach telephone call to patient.  [employer] compliance verified. RN Health Coach  introduced self to patient. Patient was very agreeable to follow up out reach calls. Patient was referred by [name] Minor  Plan: [place] will do follow up call within a month for assessment. Patient agreed to follow up call  Sheri Ryan, BSN, [place] [facility] RN Health Coach Phone: [place] complies with applicable Federal civil rights laws and does not discriminate on the basis of race, color, national origin, age, disability, or sex. Espaol (Spanish)  [place] cumple con las leyes federales de derechos civiles aplicables y no discrimina por motivos de raza, color, nacionalidad, edad, discapacidad o sexo.    [name] ([place])  [place] tun th? lu?t dn quy?n hi?n hnh c?a Lin bang v khng phn bi?t ?i x? d?a trn ch?ng t?c, mu da, ngu?n g?c qu?c gia, ? tu?i, khuy?t t?t, ho?c gi?i tnh.    (Arabic)    [place]                      .

## 2015-03-16 NOTE — Patient Outreach (Signed)
[facility]) Care Management  03/16/2015  Sheri Ryan 03/25/1929 [id number]   RN Health Coach attempted Telephone outreach call to patient.  Patient was unavailable. Per family member she will be back in an hour. [name], BSN, RN [facility] Care Management RN Health Coach Phone: [place] complies with applicable Federal civil rights laws and does not discriminate on the basis of race, color, national origin, age, disability, or sex. Espaol (Spanish)  [place] cumple con las leyes federales de derechos civiles aplicables y no discrimina por motivos de raza, color, nacionalidad, edad, discapacidad o sexo.    [name])  [place] tun th? lu?t dn quy?n hi?n hnh c?a Lin bang v khng phn bi?t ?i x? d?a trn ch?ng t?c, mu da, ngu?n g?c qu?c gia, ? tu?i, khuy?t t?t, ho?c gi?i tnh.    (Arabic)    [name]                      .

## 2015-03-31 ENCOUNTER — Other Ambulatory Visit: Payer: Self-pay | Admitting: Internal Medicine

## 2015-04-04 ENCOUNTER — Ambulatory Visit: Payer: Self-pay | Admitting: *Deleted

## 2015-04-04 ENCOUNTER — Other Ambulatory Visit: Payer: Self-pay | Admitting: *Deleted

## 2015-04-08 ENCOUNTER — Ambulatory Visit (INDEPENDENT_AMBULATORY_CARE_PROVIDER_SITE_OTHER): Payer: Medicare Other | Admitting: Internal Medicine

## 2015-04-08 ENCOUNTER — Encounter: Payer: Self-pay | Admitting: Internal Medicine

## 2015-04-08 VITALS — BP 142/84 | HR 72 | Ht 65.0 in | Wt 172.6 lb

## 2015-04-08 DIAGNOSIS — N183 Chronic kidney disease, stage 3 unspecified: Secondary | ICD-10-CM

## 2015-04-08 DIAGNOSIS — L309 Dermatitis, unspecified: Secondary | ICD-10-CM

## 2015-04-08 DIAGNOSIS — I10 Essential (primary) hypertension: Secondary | ICD-10-CM | POA: Diagnosis not present

## 2015-04-08 DIAGNOSIS — E785 Hyperlipidemia, unspecified: Secondary | ICD-10-CM | POA: Diagnosis not present

## 2015-04-08 DIAGNOSIS — E119 Type 2 diabetes mellitus without complications: Secondary | ICD-10-CM

## 2015-04-08 DIAGNOSIS — E1169 Type 2 diabetes mellitus with other specified complication: Secondary | ICD-10-CM

## 2015-04-08 MED ORDER — AMLODIPINE BESYLATE 10 MG PO TABS: 10.0000 mg | ORAL_TABLET | Freq: Every day | ORAL | Status: DC

## 2015-04-08 MED ORDER — GLUCOSE BLOOD VI STRP: ORAL_STRIP | Status: DC

## 2015-04-08 MED ORDER — BYSTOLIC 10 MG PO TABS: 10.0000 mg | ORAL_TABLET | Freq: Every day | ORAL | Status: DC

## 2015-04-08 MED ORDER — GLIPIZIDE ER 10 MG PO TB24: 10.0000 mg | ORAL_TABLET | Freq: Every day | ORAL | Status: DC

## 2015-04-08 MED ORDER — SITAGLIPTIN PHOS-METFORMIN HCL 50-500 MG PO TABS: 1.0000 | ORAL_TABLET | Freq: Every day | ORAL | Status: DC

## 2015-04-08 MED ORDER — LISINOPRIL 5 MG PO TABS: 5.0000 mg | ORAL_TABLET | Freq: Every day | ORAL | Status: DC
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
## 2015-04-09 LAB — HEMOGLOBIN A1C
ESTIMATED AVERAGE GLUCOSE: 194 mg/dL
HEMOGLOBIN A1C: 8.4 % — AB (ref 4.8–5.6)

## 2015-04-09 LAB — BASIC METABOLIC PANEL
BUN / CREAT RATIO: 19 (ref 11–26)
BUN: 22 mg/dL (ref 8–27)
CHLORIDE: 101 mmol/L (ref 96–106)
CO2: 24 mmol/L (ref 18–29)
Calcium: 9.6 mg/dL (ref 8.7–10.3)
Creatinine, Ser: 1.17 mg/dL — ABNORMAL HIGH (ref 0.57–1.00)
GFR calc non Af Amer: 43 mL/min/{1.73_m2} — ABNORMAL LOW (ref >59)
GFR, EST AFRICAN AMERICAN: 50 mL/min/{1.73_m2} — AB (ref >59)
GLUCOSE: 234 mg/dL — AB (ref 65–99)
POTASSIUM: 4.7 mmol/L (ref 3.5–5.2)
SODIUM: 142 mmol/L (ref 134–144)

## 2015-04-09 LAB — LIPID PANEL
CHOLESTEROL TOTAL: 222 mg/dL — AB (ref 100–199)
Chol/HDL Ratio: 5.2 ratio units — ABNORMAL HIGH (ref 0.0–4.4)
HDL: 43 mg/dL (ref >39)
LDL Calculated: 143 mg/dL — ABNORMAL HIGH (ref 0–99)
Triglycerides: 182 mg/dL — ABNORMAL HIGH (ref 0–149)
VLDL Cholesterol Cal: 36 mg/dL (ref 5–40)

## 2015-04-11 ENCOUNTER — Telehealth: Payer: Self-pay

## 2015-04-11 NOTE — Telephone Encounter (Signed)
Left message for patient to call back  

## 2015-04-11 NOTE — Telephone Encounter (Signed)
-----   Message from Glean Hess, MD sent at 04/09/2015 12:55 PM EDT ----- DM is much better.  Continue all same medications.  Kidney function is decreased but stable.  Cholesterol is slightly high - make sure to take simvastatin every day.

## 2015-04-11 NOTE — Telephone Encounter (Signed)
Spoke with patient. Patient advised of all results and verbalized understanding. Will call back with any future questions or concerns. MAH  

## 2015-04-27 ENCOUNTER — Encounter: Payer: Self-pay | Admitting: *Deleted

## 2015-04-27 NOTE — Patient Outreach (Addendum)
[facility] [facility]) Care Management  [facility] Care Manager  Late entry 04/04/2015  Sheri Ryan 11/21/1929 [id number]  Subjective: Subjective: Subjective: RN Health Coach telephone call to patient.  Hipaa compliance verified.Per patient she has been falling a lot. Patient ambulates with a rollator walker. Patient has had episodes of hypoglycemia. Per patient when she she feels real shaky. She takes glucose tablets . Per patient she had learned this from the the community nurse.  Patient stated she has been exercising.  Per patient she has been walking on Wed,Tue and Sat with someone to make sure she doesn't fall. Patient A1c is 10.5. Patient has agreed to follow up outreach calls from health coach.      Functional Status:  In your present state of health, do you have any difficulty performing the following activities: 04/04/2015 02/14/2015  Hearing? [place]? N -  Difficulty concentrating or making decisions? N -  Walking or climbing stairs? Y -  Dressing or bathing? N -  Doing errands, shopping? N -  Conservation officer, nature and eating ? - N  Using the Toilet? - N  Do you have problems with loss of bowel control? - N  Managing your Medications? - N  Managing your Finances? - N  Housekeeping or managing your Housekeeping? - N    Fall/Depression Screening: PHQ 2/9 Scores 04/04/2015 01/24/2015  PHQ - 2 Score 0 0   THN CM Care Plan Problem One        Most Recent Value   Care Plan Problem One  Knowledge deficit in self management of diabetes   Role Documenting the Problem One  [place] for Problem One  Active   THN Long Term Goal (31-90 days)  Patient HGB A1C will decrease by one point within the next 90 days   THN Long Term Goal Start Date  04/04/15   Interventions for Problem One [place] reminded patient to keep appointments with primary care physician. Patient reminded to take medications as per physician order. RN    THN CM  Short Term Goal #1 (0-30 days)  Patient will be able to verbalize 3 signs and symptoms of hypo and hyperglycemia within the next 30 days   THN CM Short Term Goal #1 Start Date  04/04/15   Interventions for Short Term Goal #1  RN sent patient picture charts for hypo and hyperglycemia. RN sent patient EMMI information on hypo and hyperglycemia. RN will follow up with discussion and teach back within a month   THN CM Short Term Goal #2 (0-30 days)  Patient will be able to verbalize what the HGB A1C is.   THN CM Short Term Goal #2 Start Date  04/04/15   Interventions for Short Term Goal #2  RN sent patient educational information on Know your goal numbers. RN sent patient information on how the A1C helps. RN sent  patient information on controlling your blood sugar. RN sent patient information on why get your A1C checked. RN will follow up with discussion and teachback     Assessment : This patient will benefit from Health Coach telephonic outreach for education and support for diabetes self management  Plan:  RN sent EMMI educational information on controlling your blood sugar RN sent EMMI educational information on low blood sugar and high blood sugar RN sent facial educational chart on hypo and hyperglycemia RN sent EMMI educational information on  Why get your A1C checked  RN sent educational material on Know your goals RN sent EMMI educational information on  Falls prevention RN will follow up within a month for discussion and teach back RN sent a calendar book for documentation of blood sugars  [place] Management [phone]

## 2015-05-05 ENCOUNTER — Ambulatory Visit: Payer: Self-pay | Admitting: *Deleted

## 2015-05-06 ENCOUNTER — Ambulatory Visit: Payer: Self-pay | Admitting: *Deleted

## 2015-05-06 ENCOUNTER — Other Ambulatory Visit: Payer: Self-pay | Admitting: *Deleted

## 2015-05-06 NOTE — Patient Outreach (Signed)
[place] [facility]) Care Management  05/06/2015  Sheri Ryan 1929/05/31 [id number]   RN Health Coach telephone call to patient.  Hipaa compliance verified. Per patient she is on her way out. Paatient requested that RN Health Coach call he back Monday.  [place] Care Management [phone]

## 2015-05-09 ENCOUNTER — Ambulatory Visit: Payer: Self-pay | Admitting: *Deleted

## 2015-05-09 ENCOUNTER — Other Ambulatory Visit: Payer: Self-pay | Admitting: *Deleted

## 2015-05-09 ENCOUNTER — Encounter: Payer: Self-pay | Admitting: *Deleted

## 2015-05-09 NOTE — Patient Outreach (Signed)
[place] [facility]) Care Management  Sheri Ryan  05/09/2015   Sheri Ryan 04/13/1929 [id number]  [place] telephone call to patient.  Hipaa compliance verified.Per patient she has read the information and has also shared it with a friend that has diabetes. Per patient her BS today is 287. She had just finished eating breakfast so it was not fasting. Patient states she does go to a podiatrist but it is mainly for her gout. She checks her feet each day. Patient is able to state the signs and symptoms of high and low blood sugar with the appropriate action plan. 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 Patient has a treadmill but doesn't think about using it. Per patient she uses mostly fresh vegetable to cook with. She reported rarely using can food foods. She reported that she does use frozen.  Patient has agreed to follow up out reach calls and education.    Objective:   Encounter Medications:  Outpatient Encounter Prescriptions as of 05/09/2015  Medication Sig  . amLODipine (NORVASC) 10 MG tablet Take 1 tablet (10 mg total) by mouth daily.  Marland Kitchen BYSTOLIC 10 MG tablet Take 1 tablet (10 mg total) by mouth daily.  Marland Kitchen glipiZIDE (GLUCOTROL XL) 10 MG 24 hr tablet Take 1 tablet (10 mg total) by mouth daily with breakfast.  . glucose blood (TRUE METRIX BLOOD GLUCOSE TEST) test strip Test blood sugar twice a day  . lisinopril (PRINIVIL,ZESTRIL) 5 MG tablet Take 1 tablet (5 mg total) by mouth daily.  . simvastatin (ZOCOR) 20 MG tablet Take 1 tablet (20 mg total) by mouth daily. Reported on 02/14/2015  . sitaGLIPtin-metformin (JANUMET) 50-500 MG tablet Take 1 tablet by mouth daily.   No facility-administered encounter medications on file as of 05/09/2015.    Functional Status:  In your present state of health, do you have any difficulty performing the following activities: 05/09/2015 04/04/2015  Hearing?  [place]pie Donning  Vision? N N  Difficulty concentrating or making decisions? N N  Walking or climbing stairs? Y Y  Dressing or bathing? N N  Doing errands, shopping? N N  Preparing Food and eating ? N -  Using the Toilet? N -  In the past six months, have you accidently leaked urine? N -  Do you have problems with loss of bowel control? N -  Managing your Medications? - -  Managing your Finances? N -  Housekeeping or managing your Housekeeping? N -    Fall/Depression Screening: PHQ 2/9 Scores 05/09/2015 04/04/2015 01/24/2015  PHQ - 2 Score 0 0 0   THN CM Care Plan Problem One        Most Recent Value   Care Plan Problem One  Knowledge deficit in self management of diabetes   Role Documenting the Problem One  [place] for Problem One  Active   THN Long Term Goal (31-90 days)  Patient HGB A1C will decrease by one point within the next 90 days   THN Long Term Goal Start Date  05/09/15 [continue to monitor A1C]   Interventions for Problem One Long Term Goal  RN Health Coach reminded patient to keep appointments with primary care physician. Patient reminded to take medications as per physician order. RN    THN CM Short Term Goal #1 (0-30 days)  Patient will be able to verbalize 3 signs and symptoms of hypo and hyperglycemia  within the next 30 days   THN CM Short Term Goal #1 Start Date  04/04/15   [facility] CM Short Term Goal #1 Met Date  05/09/15   Interventions for Short Term Goal #1  RN sent patient picture charts for hypo and hyperglycemia. RN sent patient EMMI information on hypo and hyperglycemia. RN will follow up with discussion and teach back within a month   THN CM Short Term Goal #2 (0-30 days)  Patient will be able to verbalize what the HGB A1C is.   THN CM Short Term Goal #2 Start Date  05/09/15 [continue. Patient is abnle to verbalize what it is but also want her to understand thatcontrolling blood sugars  make up the results]   Interventions for Short Term Goal #2  RN sent patient  educational information on Know your goal numbers. RN sent patient information on how the A1C helps. RN sent  patient information on controlling your blood sugar. RN sent patient information on why get your A1C checked. RN will follow up with discussion and teachback   THN CM Short Term Goal #3 (0-30 days)  Patient will report having a routing exercise plan  within 30 days   THN CM Short Term Goal #3 Start Date  05/09/15   Interventions for Short Tern Goal #3  RN sent patient educational material on getting active, Building up your strength, Sticking with the activity, Over coming the roadblocks.  RN will follow up with discussion and teach back.   THN CM Short Term Goal #4 (0-30 days)  Patient will be able to verbalize how to maintian her physicial health   THN CM Short Term Goal #4 Start Date  05/09/15   Interventions for Short Term Goal #4  RN will send patient educational material on having regular checkups, Foot care, eye care, Gum care. RN will send patient educational material on when you are sick. RN will follow up with  discussion and teachback.    Assessment Patient will continue to benefit from Health Coach telephonic outreach for education and support for diabetes self management. Plan: RN will send patient educational material on Getting active, Sticking to it and over coming roadblocks RN  Will send patient educational material on having regular checks RN will send patient educational material on what to do when you are sick RN will follow up with a month for discussion and teach back.  [place] Care Management [phone]

## 2015-05-11 ENCOUNTER — Encounter: Payer: Self-pay | Admitting: *Deleted

## 2015-06-06 ENCOUNTER — Other Ambulatory Visit: Payer: Self-pay | Admitting: *Deleted

## 2015-06-06 ENCOUNTER — Ambulatory Visit: Payer: Self-pay | Admitting: *Deleted

## 2015-06-06 NOTE — Patient Outreach (Signed)
[facility] [facility]) Care Management  06/06/2015  Sheri Ryan 12-31-1929 [id number]   RN Health Coach attempted  #1 Follow up outreach call to patient.  Patient was unavailable. HIPPA compliance voicemail message was left with return callback number.    [place] Care Management [phone]

## 2015-06-20 ENCOUNTER — Other Ambulatory Visit: Payer: Self-pay | Admitting: *Deleted

## 2015-06-20 NOTE — Patient Outreach (Addendum)
[facility]) Care Management  06/20/2015  Sheri Ryan [birth year] [id number]   RN Health Coach telephone call to patient.  Hipaa compliance verified. Per patient requested that RN call her back Plan: RN will try to contact patient again within 14 days [place] Management [phone];

## 2015-06-27 ENCOUNTER — Other Ambulatory Visit: Payer: Self-pay | Admitting: *Deleted

## 2015-06-27 NOTE — Patient Outreach (Signed)
[place] [facility]) Care Management  06/27/2015  Kmora Korell 1929-10-29 [id number]   RN Health Coach attempted  Follow up outreach call to patient.  Patient was unavailable. HIPPA compliance Message left with husband.  [place] Care Management [phone]

## 2015-06-28 ENCOUNTER — Other Ambulatory Visit: Payer: Self-pay | Admitting: *Deleted

## 2015-06-28 NOTE — Patient Outreach (Signed)
[place] [facility]) Care Management  06/28/2015  Sanae Filo 11-Jul-1929 [id number]  RN Health  attempted  Follow up outreach call to patient.  Patient was unavailable. She had already left this morning HIPPA compliance message was left with husband. [place] Care Management [phone]

## 2015-07-04 ENCOUNTER — Other Ambulatory Visit: Payer: Self-pay | Admitting: *Deleted

## 2015-07-04 ENCOUNTER — Encounter: Payer: Self-pay | Admitting: *Deleted

## 2015-07-04 NOTE — Patient Outreach (Signed)
[facility]) Care Management  [place]  07/04/2015   Sheri Ryan [birth year] [id number]  Subjective: RN Health Coach telephone call to patient.  Hipaa compliance verified. 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 . Her blood sugar today was 275. Patient has not been eating the HS snack.  Patient has been very active with her walking but not in a regular routine. Patient requested additional food list on her diabetes and low sodium. Patient has a  Dr appointment for her Hgb A1C to be drawn . Patient couldn't remember the date  Of the appointment.RN confirmed the date for her.  Patient stated she will be making the appointment and is looking to receiving the information. Patient agreed to follow up outreach call.    Objective:   Encounter Medications:  Outpatient Encounter Prescriptions as of 07/04/2015  Medication Sig  . amLODipine (NORVASC) 10 MG tablet Take 1 tablet (10 mg total) by mouth daily.  Marland Kitchen BYSTOLIC 10 MG tablet Take 1 tablet (10 mg total) by mouth daily.  Marland Kitchen glipiZIDE (GLUCOTROL XL) 10 MG 24 hr tablet Take 1 tablet (10 mg total) by mouth daily with breakfast.  . glucose blood (TRUE METRIX BLOOD GLUCOSE TEST) test strip Test blood sugar twice a day  . lisinopril (PRINIVIL,ZESTRIL) 5 MG tablet Take 1 tablet (5 mg total) by mouth daily.  . simvastatin (ZOCOR) 20 MG tablet Take 1 tablet (20 mg total) by mouth daily. Reported on 02/14/2015  . sitaGLIPtin-metformin (JANUMET) 50-500 MG tablet Take 1 tablet by mouth daily.   No facility-administered encounter medications on file as of 07/04/2015.    Functional Status:  In your present state of health, do you have any difficulty performing the following activities: 05/09/2015 04/04/2015  Hearing? [place]pie Donning  Vision? N N  Difficulty concentrating or making decisions? N N  Walking or climbing stairs? Y Y  Dressing or bathing? N N  Doing  errands, shopping? N N  Preparing Food and eating ? N -  Using the Toilet? N -  In the past six months, have you accidently leaked urine? N -  Do you have problems with loss of bowel control? N -  Managing your Medications? - -  Managing your Finances? N -  Housekeeping or managing your Housekeeping? N -    Fall/Depression Screening: PHQ 2/9 Scores 07/04/2015 05/09/2015 04/04/2015 01/24/2015  PHQ - 2 Score 0 0 0 0   THN CM Care Plan Problem One        Most Recent Value   Care Plan Problem One  Knowledge deficit in self management of diabetes   Role Documenting the Problem One  [place] for Problem One  Active   THN Long Term Goal (31-90 days)  Patient HGB A1C will decrease by one point within the next 90 days   THN Long Term Goal Start Date  07/04/15 [continue/ next appointment with PCP is in July for A1C to be drawn]   Interventions for Problem One [place] reminded patient to keep appointments with primary care physician. Patient reminded to take medications as per physician order. RN    THN CM Short Term Goal #1 (0-30 days)  Patient will be able to verbalize 3 signs and symptoms of hypo and hyperglycemia within the next 30 days   THN CM Short Term Goal #1 Start Date  05/09/15  Interventions for Short Term Goal #1  RN sent patient picture charts for hypo and hyperglycemia. RN sent patient EMMI information on hypo and hyperglycemia. RN will follow up with discussion and teach back within a month   THN CM Short Term Goal #2 (0-30 days)  Patient will be able to verbalize what the HGB A1C is.   THN CM Short Term Goal #2 Start Date  07/04/15 [continue. Patient is trying to correlate with her pending resultas in July]   Interventions for Short Term Goal #2  RN sent patient educational information on Know your goal numbers. RN sent patient information on how the A1C helps. RN sent  patient information on controlling your blood sugar. RN sent patient  information on why get your A1C checked. RN will follow up with discussion and teachback   THN CM Short Term Goal #3 Start Date  07/04/15 [continue / patient has a walking rountine but it needs to be expanded]   Interventions for Short Tern Goal #3  RN sent patient educational material on getting active, Building up your strength, Sticking with the activity, Over coming the roadblocks.  RN will follow up with discussion and teach back.   THN CM Short Term Goal #4 (0-30 days)  Patient will be able to verbalize how to maintian her physicial health within the next 30 days   THN CM Short Term Goal #4 Start Date  07/04/15 [Continue/Patient understands that she needs to make and maintain her appointments butt has a busy lifestyle that she sometimes forget and needs to be reminded to make the appointments]   Interventions for Short Term Goal #4  RN will send patient educational material on having regular checkups, Foot care, eye care, Gum care. RN will send patient educational material on when you are sick. RN will follow up with  discussion and teachback.   THN CM Short Term Goal #5 (0-30 days)  Patient will report eating healthier and making healthier food choices within the  next 30 days   THN CM Short Term Goal #5 Start Date  07/04/15   Interventions for Short Term Goal #5  RN sent patient patient educational material on low sodium diet. RN sent low sodium food chart/ RN sent educational material on creating a meal plan.  RN sent educational material on [employer]. RN sent educational material on eatin away from home. RN sent educational material on healthy snacks. RN will follow up with teach back and discussion.      Assessment: Patient has not been eating regularly.  Patient has knowledge deficit in appropriate foods to eat Patient will continue to benefit from [place] telephonic outreach for education and support for diabetes self management.    Plan:  RN sent educational information on food  preparation RN sent low sodium picture food chart RN sent counting carbohydrate picture chart RN sent information on dash diet, low sodium diet, diabetic diet RN sent  Dash samples RN sent healthy snack examples RN encouraged routine scheduled exercise RN made patient aware of date and time of physician appointment  [place] Management [phone]

## 2015-08-01 ENCOUNTER — Other Ambulatory Visit: Payer: Self-pay | Admitting: *Deleted

## 2015-08-01 NOTE — Patient Outreach (Signed)
[place] [facility]) Care Management  08/01/2015  Sheri Ryan [birth year] [id number]   RN Health Coach attempted #1  Follow up outreach call to patient.  Patient was unavailable. HIPPA compliance voicemail message was left with return callback number.  Plan: RN will call patient again within 14 days.   [place] Care Management [phone]

## 2015-08-08 ENCOUNTER — Ambulatory Visit: Payer: Self-pay | Admitting: Internal Medicine

## 2015-08-15 ENCOUNTER — Other Ambulatory Visit: Payer: Self-pay | Admitting: *Deleted

## 2015-08-15 NOTE — Patient Outreach (Signed)
[facility]) Care Management  08/15/2015  [name] [birth year] [id number]   RN Health Coach attempted  #2  Follow up outreach call to patient.  Patient was unavailable. HIPPA compliance voicemail message was left with return callback number.  Plan: RN will call patient again within 14 days.    [place] Care Management [phone]

## 2015-08-29 ENCOUNTER — Other Ambulatory Visit: Payer: Self-pay | Admitting: *Deleted

## 2015-08-29 ENCOUNTER — Encounter: Payer: Self-pay | Admitting: *Deleted

## 2015-08-29 NOTE — Patient Outreach (Signed)
[facility]) Care Management  08/29/2015  Sheri Ryan 1929-08-19 [id number]    RN Health Coach telephone call to patient.  Hipaa compliance verified. Per patient she is very busy right now and can't talk. This is 3rd attempt outreach. PLAN: RN will send unsuccessful outreach letter to patient.  [place] Care Management [phone]

## 2015-09-12 ENCOUNTER — Encounter: Payer: Self-pay | Admitting: *Deleted

## 2015-10-17 ENCOUNTER — Other Ambulatory Visit: Payer: Self-pay | Admitting: Internal Medicine

## 2015-10-17 DIAGNOSIS — E119 Type 2 diabetes mellitus without complications: Secondary | ICD-10-CM

## 2015-11-02 NOTE — Telephone Encounter (Signed)
pts coming in on 10/30 for follow up dm

## 2015-11-10 DIAGNOSIS — S81809A Unspecified open wound, unspecified lower leg, initial encounter: Secondary | ICD-10-CM | POA: Diagnosis not present

## 2015-11-10 DIAGNOSIS — E119 Type 2 diabetes mellitus without complications: Secondary | ICD-10-CM | POA: Diagnosis not present

## 2015-11-10 DIAGNOSIS — W540XXA Bitten by dog, initial encounter: Secondary | ICD-10-CM | POA: Diagnosis not present

## 2015-11-12 DIAGNOSIS — W540XXA Bitten by dog, initial encounter: Secondary | ICD-10-CM | POA: Diagnosis not present

## 2015-11-12 DIAGNOSIS — S81809A Unspecified open wound, unspecified lower leg, initial encounter: Secondary | ICD-10-CM | POA: Diagnosis not present

## 2015-11-14 ENCOUNTER — Ambulatory Visit: Payer: Self-pay | Admitting: Internal Medicine

## 2015-11-20 ENCOUNTER — Other Ambulatory Visit: Payer: Self-pay | Admitting: Internal Medicine

## 2015-11-20 DIAGNOSIS — E119 Type 2 diabetes mellitus without complications: Secondary | ICD-10-CM

## 2015-11-25 ENCOUNTER — Ambulatory Visit (INDEPENDENT_AMBULATORY_CARE_PROVIDER_SITE_OTHER): Payer: Medicare Other | Admitting: Internal Medicine

## 2015-11-25 ENCOUNTER — Encounter: Payer: Self-pay | Admitting: Internal Medicine

## 2015-11-25 ENCOUNTER — Ambulatory Visit: Payer: Self-pay | Admitting: Internal Medicine

## 2015-11-25 VITALS — BP 138/84 | HR 69 | Ht 65.0 in | Wt 171.2 lb

## 2015-11-25 DIAGNOSIS — Z23 Encounter for immunization: Secondary | ICD-10-CM | POA: Diagnosis not present

## 2015-11-25 DIAGNOSIS — E119 Type 2 diabetes mellitus without complications: Secondary | ICD-10-CM | POA: Diagnosis not present

## 2015-11-25 DIAGNOSIS — E1169 Type 2 diabetes mellitus with other specified complication: Secondary | ICD-10-CM

## 2015-11-25 DIAGNOSIS — E785 Hyperlipidemia, unspecified: Secondary | ICD-10-CM

## 2015-11-25 DIAGNOSIS — D485 Neoplasm of uncertain behavior of skin: Secondary | ICD-10-CM | POA: Diagnosis not present

## 2015-11-25 DIAGNOSIS — I10 Essential (primary) hypertension: Secondary | ICD-10-CM | POA: Diagnosis not present

## 2015-11-25 MED ORDER — SIMVASTATIN 20 MG PO TABS: 20.0000 mg | ORAL_TABLET | Freq: Every day | ORAL | 5 refills | Status: DC

## 2015-11-25 MED ORDER — AMLODIPINE BESYLATE 10 MG PO TABS: 10.0000 mg | ORAL_TABLET | Freq: Every day | ORAL | 5 refills | Status: DC

## 2015-11-25 MED ORDER — BYSTOLIC 10 MG PO TABS: 10.0000 mg | ORAL_TABLET | Freq: Every day | ORAL | 5 refills | Status: DC

## 2015-11-25 MED ORDER — LISINOPRIL 5 MG PO TABS: 5.0000 mg | ORAL_TABLET | Freq: Every day | ORAL | 5 refills | Status: DC

## 2015-11-25 MED ORDER — GLIPIZIDE ER 10 MG PO TB24: 10.0000 mg | ORAL_TABLET | Freq: Every day | ORAL | 5 refills | Status: DC

## 2015-11-25 MED ORDER — SITAGLIPTIN PHOS-METFORMIN HCL 50-500 MG PO TABS: 1.0000 | ORAL_TABLET | Freq: Every day | ORAL | 5 refills | Status: DC

## 2015-11-25 NOTE — Patient Instructions (Addendum)
Tdap Vaccine (Tetanus, Diphtheria and Pertussis): What You Need to Know 1. Why get vaccinated? Tetanus, diphtheria and pertussis are very serious diseases. Tdap vaccine can protect us from these diseases. And, Tdap vaccine given to pregnant women can protect newborn babies against pertussis. TETANUS (Lockjaw) is rare in the [place] today. It causes painful muscle tightening and stiffness, usually all over the body.  It can lead to tightening of muscles in the head and neck so you can't open your mouth, swallow, or sometimes even breathe. Tetanus kills about 1 out of 10 people who are infected even after receiving the best medical care. DIPHTHERIA is also rare in the [place] today. It can cause a thick coating to form in the back of the throat.  It can lead to breathing problems, heart failure, paralysis, and death. PERTUSSIS (Whooping Cough) causes severe coughing spells, which can cause difficulty breathing, vomiting and disturbed sleep.  It can also lead to weight loss, incontinence, and rib fractures. Up to 2 in 100 adolescents and 5 in 100 adults with pertussis are hospitalized or have complications, which could include pneumonia or death. These diseases are caused by bacteria. Diphtheria and pertussis are spread from person to person through secretions from coughing or sneezing. Tetanus enters the body through cuts, scratches, or wounds. Before vaccines, as many as 200,000 cases of diphtheria, 200,000 cases of pertussis, and hundreds of cases of tetanus, were reported in the [place] each year. Since vaccination began, reports of cases for tetanus and diphtheria have dropped by about 99% and for pertussis by about 80%. 2. Tdap vaccine Tdap vaccine can protect adolescents and adults from tetanus, diphtheria, and pertussis. One dose of Tdap is routinely given at age 11 or 12. People who did not get Tdap at that age should get it as soon as possible. Tdap is especially important  for healthcare professionals and anyone having close contact with a baby younger than 12 months. Pregnant women should get a dose of Tdap during every pregnancy, to protect the newborn from pertussis. Infants are most at risk for severe, life-threatening complications from pertussis. Another vaccine, called Td, protects against tetanus and diphtheria, but not pertussis. A Td booster should be given every 10 years. Tdap may be given as one of these boosters if you have never gotten Tdap before. Tdap may also be given after a severe cut or burn to prevent tetanus infection. Your doctor or the person giving you the vaccine can give you more information. Tdap may safely be given at the same time as other vaccines. 3. Some people should not get this vaccine  A person who has ever had a life-threatening allergic reaction after a previous dose of any diphtheria, tetanus or pertussis containing vaccine, OR has a severe allergy to any part of this vaccine, should not get Tdap vaccine. Tell the person giving the vaccine about any severe allergies.  Anyone who had coma or long repeated seizures within 7 days after a childhood dose of DTP or DTaP, or a previous dose of Tdap, should not get Tdap, unless a cause other than the vaccine was found. They can still get Td.  Talk to your doctor if you:  have seizures or another nervous system problem,  had severe pain or swelling after any vaccine containing diphtheria, tetanus or pertussis,  ever had a condition called Guillain-Barr Syndrome (GBS),  aren't feeling well on the day the shot is scheduled. 4. Risks With any medicine, including vaccines, there is   a chance of side effects. These are usually mild and go away on their own. Serious reactions are also possible but are rare. Most people who get Tdap vaccine do not have any problems with it. Mild problems following Tdap (Did not interfere with activities)  Pain where the shot was given (about 3 in 4  adolescents or 2 in 3 adults)  Redness or swelling where the shot was given (about 1 person in 5)  Mild fever of at least 100.4F (up to about 1 in 25 adolescents or 1 in 100 adults)  Headache (about 3 or 4 people in 10)  Tiredness (about 1 person in 3 or 4)  Nausea, vomiting, diarrhea, stomach ache (up to 1 in 4 adolescents or 1 in 10 adults)  Chills, sore joints (about 1 person in 10)  Body aches (about 1 person in 3 or 4)  Rash, swollen glands (uncommon) Moderate problems following Tdap (Interfered with activities, but did not require medical attention)  Pain where the shot was given (up to 1 in 5 or 6)  Redness or swelling where the shot was given (up to about 1 in 16 adolescents or 1 in 12 adults)  Fever over 102F (about 1 in 100 adolescents or 1 in 250 adults)  Headache (about 1 in 7 adolescents or 1 in 10 adults)  Nausea, vomiting, diarrhea, stomach ache (up to 1 or 3 people in 100)  Swelling of the entire arm where the shot was given (up to about 1 in 500). Severe problems following Tdap (Unable to perform usual activities; required medical attention)  Swelling, severe pain, bleeding and redness in the arm where the shot was given (rare). Problems that could happen after any vaccine:  People sometimes faint after a medical procedure, including vaccination. Sitting or lying down for about 15 minutes can help prevent fainting, and injuries caused by a fall. Tell your doctor if you feel dizzy, or have vision changes or ringing in the ears.  Some people get severe pain in the shoulder and have difficulty moving the arm where a shot was given. This happens very rarely.  Any medication can cause a severe allergic reaction. Such reactions from a vaccine are very rare, estimated at fewer than 1 in a million doses, and would happen within a few minutes to a few hours after the vaccination. As with any medicine, there is a very remote chance of a vaccine causing a serious  injury or death. The safety of vaccines is always being monitored. For more information, visit: www.cdc.gov/vaccinesafety/ 5. What if there is a serious problem? What should I look for?  Look for anything that concerns you, such as signs of a severe allergic reaction, very high fever, or unusual behavior.  Signs of a severe allergic reaction can include hives, swelling of the face and throat, difficulty breathing, a fast heartbeat, dizziness, and weakness. These would usually start a few minutes to a few hours after the vaccination. What should I do?  If you think it is a severe allergic reaction or other emergency that can't wait, call 9-1-1 or get the person to the nearest hospital. Otherwise, call your doctor.  Afterward, the reaction should be reported to the Vaccine Adverse Event Reporting System (VAERS). Your doctor might file this report, or you can do it yourself through the VAERS web site at www.vaers.hhs.gov, or by calling 1-800-822-7967. VAERS does not give medical advice.  6. The National Vaccine Injury Compensation Program The National Vaccine Injury Compensation Program (  VICP) is a federal program that was created to compensate people who may have been injured by certain vaccines. Persons who believe they may have been injured by a vaccine can learn about the program and about filing a claim by calling 1-800-338-2382 or visiting the VICP website at www.hrsa.gov/vaccinecompensation. There is a time limit to file a claim for compensation. 7. How can I learn more?  Ask your doctor. He or she can give you the vaccine package insert or suggest other sources of information.  Call your local or state health department.  Contact the Centers for Disease Control and Prevention (CDC):  Call 1-800-232-4636 (1-800-CDC-INFO) or  Visit CDC's website at www.cdc.gov/vaccines CDC Tdap Vaccine VIS (03/10/13)   This information is not intended to replace advice given to you by your health care  provider. Make sure you discuss any questions you have with your health care provider.   Document Released: 07/03/2011 Document Revised: 01/22/2014 Document Reviewed: 04/15/2013 Elsevier Interactive Patient Education 2016 Elsevier Inc.  
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
## 2015-11-26 LAB — COMPREHENSIVE METABOLIC PANEL
A/G RATIO: 1.4 (ref 1.2–2.2)
ALT: 16 IU/L (ref 0–32)
AST: 13 IU/L (ref 0–40)
Albumin: 4.3 g/dL (ref 3.5–4.7)
Alkaline Phosphatase: 100 IU/L (ref 39–117)
BUN/Creatinine Ratio: 17 (ref 12–28)
BUN: 23 mg/dL (ref 8–27)
Bilirubin Total: 0.2 mg/dL (ref 0.0–1.2)
CALCIUM: 9.3 mg/dL (ref 8.7–10.3)
CO2: 23 mmol/L (ref 18–29)
CREATININE: 1.34 mg/dL — AB (ref 0.57–1.00)
Chloride: 99 mmol/L (ref 96–106)
GFR, EST AFRICAN AMERICAN: 42 mL/min/{1.73_m2} — AB (ref >59)
GFR, EST NON AFRICAN AMERICAN: 36 mL/min/{1.73_m2} — AB (ref >59)
Globulin, Total: 3 g/dL (ref 1.5–4.5)
Glucose: 309 mg/dL — ABNORMAL HIGH (ref 65–99)
Potassium: 4.7 mmol/L (ref 3.5–5.2)
Sodium: 141 mmol/L (ref 134–144)
TOTAL PROTEIN: 7.3 g/dL (ref 6.0–8.5)

## 2015-11-26 LAB — HEMOGLOBIN A1C
ESTIMATED AVERAGE GLUCOSE: 212 mg/dL
HEMOGLOBIN A1C: 9 % — AB (ref 4.8–5.6)

## 2016-01-18 ENCOUNTER — Other Ambulatory Visit: Payer: Self-pay | Admitting: Internal Medicine

## 2016-01-18 DIAGNOSIS — I10 Essential (primary) hypertension: Secondary | ICD-10-CM

## 2016-02-17 ENCOUNTER — Ambulatory Visit: Payer: Medicare Other

## 2016-02-17 ENCOUNTER — Ambulatory Visit
Admission: EM | Admit: 2016-02-17 | Discharge: 2016-02-17 | Disposition: A | Discharge status: Home / Self Care | Payer: Medicare Other | Attending: Family Medicine | Admitting: Family Medicine

## 2016-02-17 ENCOUNTER — Encounter: Payer: Self-pay | Admitting: Emergency Medicine

## 2016-02-17 DIAGNOSIS — I700 Atherosclerosis of aorta: Secondary | ICD-10-CM | POA: Insufficient documentation

## 2016-02-17 DIAGNOSIS — J069 Acute upper respiratory infection, unspecified: Secondary | ICD-10-CM | POA: Diagnosis not present

## 2016-02-17 DIAGNOSIS — R05 Cough: Secondary | ICD-10-CM | POA: Diagnosis not present

## 2016-02-17 DIAGNOSIS — Z87891 Personal history of nicotine dependence: Secondary | ICD-10-CM | POA: Insufficient documentation

## 2016-02-17 DIAGNOSIS — E1122 Type 2 diabetes mellitus with diabetic chronic kidney disease: Secondary | ICD-10-CM | POA: Diagnosis not present

## 2016-02-17 DIAGNOSIS — I517 Cardiomegaly: Secondary | ICD-10-CM | POA: Insufficient documentation

## 2016-02-17 DIAGNOSIS — Z79899 Other long term (current) drug therapy: Secondary | ICD-10-CM | POA: Insufficient documentation

## 2016-02-17 DIAGNOSIS — Z7984 Long term (current) use of oral hypoglycemic drugs: Secondary | ICD-10-CM | POA: Diagnosis not present

## 2016-02-17 DIAGNOSIS — N189 Chronic kidney disease, unspecified: Secondary | ICD-10-CM | POA: Insufficient documentation

## 2016-02-17 MED ORDER — ALBUTEROL SULFATE HFA 108 (90 BASE) MCG/ACT IN AERS: 1.0000 | INHALATION_SPRAY | Freq: Four times a day (QID) | RESPIRATORY_TRACT | 0 refills | Status: DC | PRN

## 2016-02-17 MED ORDER — BENZONATATE 200 MG PO CAPS: ORAL_CAPSULE | ORAL | 0 refills | Status: DC

## 2016-02-17 MED ORDER — DOXYCYCLINE HYCLATE 100 MG PO CAPS: 100.0000 mg | ORAL_CAPSULE | Freq: Two times a day (BID) | ORAL | 0 refills | Status: DC

## 2016-02-17 NOTE — ED Triage Notes (Signed)
Patient c/o HAs, cough, and chest congestion for the past 3-4 days.  Patient denies fevers.
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
## 2016-03-16 DIAGNOSIS — L309 Dermatitis, unspecified: Secondary | ICD-10-CM | POA: Diagnosis not present

## 2016-03-16 DIAGNOSIS — L439 Lichen planus, unspecified: Secondary | ICD-10-CM | POA: Diagnosis not present

## 2016-03-26 ENCOUNTER — Ambulatory Visit: Payer: Self-pay | Admitting: Internal Medicine

## 2016-03-30 DIAGNOSIS — L430 Hypertrophic lichen planus: Secondary | ICD-10-CM | POA: Diagnosis not present

## 2016-06-26 DIAGNOSIS — L430 Hypertrophic lichen planus: Secondary | ICD-10-CM | POA: Diagnosis not present

## 2016-08-11 ENCOUNTER — Other Ambulatory Visit: Payer: Self-pay | Admitting: Internal Medicine

## 2016-08-11 DIAGNOSIS — E119 Type 2 diabetes mellitus without complications: Secondary | ICD-10-CM

## 2016-08-14 ENCOUNTER — Other Ambulatory Visit: Payer: Self-pay | Admitting: Internal Medicine

## 2016-08-14 DIAGNOSIS — E119 Type 2 diabetes mellitus without complications: Secondary | ICD-10-CM

## 2016-08-15 NOTE — Telephone Encounter (Signed)
Patient needs appt to continue to get refills.

## 2016-09-10 ENCOUNTER — Other Ambulatory Visit: Payer: Self-pay | Admitting: Internal Medicine

## 2016-09-10 DIAGNOSIS — E119 Type 2 diabetes mellitus without complications: Secondary | ICD-10-CM

## 2016-09-10 DIAGNOSIS — I10 Essential (primary) hypertension: Secondary | ICD-10-CM

## 2016-09-28 ENCOUNTER — Other Ambulatory Visit: Payer: Self-pay | Admitting: Internal Medicine

## 2016-09-28 DIAGNOSIS — E119 Type 2 diabetes mellitus without complications: Secondary | ICD-10-CM

## 2016-10-01 NOTE — Telephone Encounter (Signed)
Please call patient and inform her she will not get any more refills on medications until seen for DM follow up with labs OV. Please call patient to schedule. Thanks.

## 2016-10-03 ENCOUNTER — Telehealth: Payer: Self-pay

## 2016-10-03 NOTE — Telephone Encounter (Signed)
Called pt and left a message.

## 2016-10-03 NOTE — Telephone Encounter (Signed)
Patient called back leaving VM- returning your call about appt. Please Advise.

## 2016-10-08 ENCOUNTER — Encounter: Payer: Self-pay | Admitting: Internal Medicine

## 2016-10-08 ENCOUNTER — Ambulatory Visit (INDEPENDENT_AMBULATORY_CARE_PROVIDER_SITE_OTHER): Payer: Medicare Other | Admitting: Internal Medicine

## 2016-10-08 VITALS — BP 128/62 | HR 68 | Ht 65.0 in | Wt 166.0 lb

## 2016-10-08 DIAGNOSIS — I10 Essential (primary) hypertension: Secondary | ICD-10-CM

## 2016-10-08 DIAGNOSIS — N183 Chronic kidney disease, stage 3 unspecified: Secondary | ICD-10-CM

## 2016-10-08 DIAGNOSIS — L309 Dermatitis, unspecified: Secondary | ICD-10-CM

## 2016-10-08 DIAGNOSIS — E1169 Type 2 diabetes mellitus with other specified complication: Secondary | ICD-10-CM

## 2016-10-08 DIAGNOSIS — E119 Type 2 diabetes mellitus without complications: Secondary | ICD-10-CM

## 2016-10-08 DIAGNOSIS — E785 Hyperlipidemia, unspecified: Secondary | ICD-10-CM | POA: Diagnosis not present

## 2016-10-08 MED ORDER — LISINOPRIL 5 MG PO TABS: 5.0000 mg | ORAL_TABLET | Freq: Every day | ORAL | 5 refills | Status: DC

## 2016-10-08 MED ORDER — SITAGLIPTIN PHOS-METFORMIN HCL 50-500 MG PO TABS: 1.0000 | ORAL_TABLET | Freq: Every day | ORAL | 5 refills | Status: DC

## 2016-10-08 MED ORDER — BYSTOLIC 10 MG PO TABS: 10.0000 mg | ORAL_TABLET | Freq: Every day | ORAL | 5 refills | Status: DC

## 2016-10-08 MED ORDER — AMLODIPINE BESYLATE 10 MG PO TABS: 10.0000 mg | ORAL_TABLET | Freq: Every day | ORAL | 5 refills | Status: DC

## 2016-10-08 MED ORDER — GLIPIZIDE ER 10 MG PO TB24: 10.0000 mg | ORAL_TABLET | Freq: Every day | ORAL | 5 refills | Status: DC
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
## 2016-10-09 LAB — LIPID PANEL
CHOL/HDL RATIO: 5.5 ratio — AB (ref 0.0–4.4)
Cholesterol, Total: 255 mg/dL — ABNORMAL HIGH (ref 100–199)
HDL: 46 mg/dL (ref >39)
LDL Calculated: 165 mg/dL — ABNORMAL HIGH (ref 0–99)
TRIGLYCERIDES: 220 mg/dL — AB (ref 0–149)
VLDL CHOLESTEROL CAL: 44 mg/dL — AB (ref 5–40)

## 2016-10-09 LAB — COMPREHENSIVE METABOLIC PANEL
ALBUMIN: 4.4 g/dL (ref 3.5–4.7)
ALK PHOS: 100 IU/L (ref 39–117)
ALT: 16 IU/L (ref 0–32)
AST: 19 IU/L (ref 0–40)
Albumin/Globulin Ratio: 1.3 (ref 1.2–2.2)
BILIRUBIN TOTAL: 0.4 mg/dL (ref 0.0–1.2)
BUN / CREAT RATIO: 16 (ref 12–28)
BUN: 20 mg/dL (ref 8–27)
CO2: 23 mmol/L (ref 20–29)
Calcium: 9.9 mg/dL (ref 8.7–10.3)
Chloride: 99 mmol/L (ref 96–106)
Creatinine, Ser: 1.23 mg/dL — ABNORMAL HIGH (ref 0.57–1.00)
GFR calc Af Amer: 46 mL/min/{1.73_m2} — ABNORMAL LOW (ref >59)
GFR calc non Af Amer: 40 mL/min/{1.73_m2} — ABNORMAL LOW (ref >59)
GLUCOSE: 259 mg/dL — AB (ref 65–99)
Globulin, Total: 3.3 g/dL (ref 1.5–4.5)
POTASSIUM: 5 mmol/L (ref 3.5–5.2)
SODIUM: 141 mmol/L (ref 134–144)
Total Protein: 7.7 g/dL (ref 6.0–8.5)

## 2016-10-09 LAB — HEMOGLOBIN A1C
Est. average glucose Bld gHb Est-mCnc: 237 mg/dL
HEMOGLOBIN A1C: 9.9 % — AB (ref 4.8–5.6)

## 2016-10-09 LAB — MICROALBUMIN / CREATININE URINE RATIO
Creatinine, Urine: 31.1 mg/dL
MICROALB/CREAT RATIO: 281.4 mg/g{creat} — AB (ref 0.0–30.0)
MICROALBUM., U, RANDOM: 87.5 ug/mL

## 2016-10-09 LAB — TSH: TSH: 2.6 u[IU]/mL (ref 0.450–4.500)

## 2016-10-12 NOTE — Progress Notes (Signed)
Spoke with patient and informed her Dm is terrible. Tried scheduling her for appt to discuss injectable medication. She stated she wants to wait unil after her appt with the eye doctor Oct 4th. I informed her its best to get her in here as soon as possible- and she still denied and stated she will call us at the end of next week about it. Please Advise.

## 2016-10-17 ENCOUNTER — Telehealth: Payer: Self-pay | Admitting: Internal Medicine

## 2016-10-17 NOTE — Telephone Encounter (Signed)
Called pt to sched for Annual Wellness Visit with Nurse Health Advisor. C/b #  [phone]  Kathryn Brown ° °

## 2016-10-18 DIAGNOSIS — H25813 Combined forms of age-related cataract, bilateral: Secondary | ICD-10-CM | POA: Diagnosis not present

## 2016-10-18 DIAGNOSIS — E113393 Type 2 diabetes mellitus with moderate nonproliferative diabetic retinopathy without macular edema, bilateral: Secondary | ICD-10-CM | POA: Diagnosis not present

## 2016-10-19 ENCOUNTER — Other Ambulatory Visit: Payer: Self-pay | Admitting: Internal Medicine

## 2016-10-19 DIAGNOSIS — E1165 Type 2 diabetes mellitus with hyperglycemia: Principal | ICD-10-CM

## 2016-10-19 DIAGNOSIS — E1122 Type 2 diabetes mellitus with diabetic chronic kidney disease: Secondary | ICD-10-CM

## 2016-10-19 DIAGNOSIS — IMO0002 Reserved for concepts with insufficient information to code with codable children: Secondary | ICD-10-CM

## 2016-10-19 NOTE — Progress Notes (Signed)
Patient stated having eye surgery at [facility] next week. Wants to know if can do diabetic nutrition classes before doing injections. Please Advise.

## 2016-10-23 DIAGNOSIS — H2511 Age-related nuclear cataract, right eye: Secondary | ICD-10-CM | POA: Diagnosis not present

## 2016-11-05 ENCOUNTER — Encounter: Payer: Medicare Other | Attending: Internal Medicine | Admitting: Dietician

## 2016-11-05 ENCOUNTER — Encounter: Payer: Self-pay | Admitting: Dietician

## 2016-11-05 VITALS — BP 162/84 | Ht 65.0 in | Wt 166.3 lb

## 2016-11-05 DIAGNOSIS — E1165 Type 2 diabetes mellitus with hyperglycemia: Secondary | ICD-10-CM | POA: Insufficient documentation

## 2016-11-05 DIAGNOSIS — Z713 Dietary counseling and surveillance: Secondary | ICD-10-CM | POA: Insufficient documentation

## 2016-11-05 DIAGNOSIS — E1122 Type 2 diabetes mellitus with diabetic chronic kidney disease: Secondary | ICD-10-CM | POA: Insufficient documentation

## 2016-11-05 DIAGNOSIS — N189 Chronic kidney disease, unspecified: Secondary | ICD-10-CM | POA: Diagnosis not present

## 2016-11-05 DIAGNOSIS — E1136 Type 2 diabetes mellitus with diabetic cataract: Secondary | ICD-10-CM

## 2016-11-05 NOTE — Patient Instructions (Addendum)
  Check blood sugars 2 x day before breakfast and 2 hrs after breakfast every day  Bring blood sugar records to the next appointment/class  Eat 3 meals day,   1 snacks a day at bedtime  Space meals 4-5 hours apart  Eat 2-3 carbohydrate servings/meal + protein  Eat 1 carbohydrate serving/snack + protein  Avoid sugar sweetened drinks (soda, tea, coffee, sports drinks, juices)  Limit intake of sweets and fried foods  Drink alcohol only with food  Get a Haematologist fast acting glucose and a snack at all times  Carry medical alert ID at all times  Return for appointment/classes on: 11-19-16 at 10:30a

## 2016-11-05 NOTE — Progress Notes (Signed)
Diabetes Self-Management Education  Visit Type: First/Initial  Appt. Start Time: 1030 Appt. End Time: 1200  11/05/2016  [name], identified by name and date of birth, is a 83 y.o. female with a diagnosis of Diabetes: Type 2.   ASSESSMENT  Blood pressure (!) 162/84, height 5\' 5"  (1.651 m), weight 166 lb 4.8 oz (75.4 kg). Body mass index is 27.67 kg/m.      Diabetes Self-Management Education - 11/05/16 1200      Visit Information   Visit Type First/Initial     Initial Visit   Diabetes Type Type 2     Health Coping   How would you rate your overall health? Good     Psychosocial Assessment   Self-care barriers Impaired vision  had recent cataract surgery right eye and vision is still blurred   Self-management support Family;Doctor's office   Patient Concerns Nutrition/Meal planning;Weight Control;Glycemic Control  become more fit, prevent complications   Special Needs Large print   Preferred Learning Style Auditory   Learning Readiness Ready   What is the last grade level you completed in school? 12 + 1.5 year college     Pre-Education Assessment   Patient understands the diabetes disease and treatment process. Needs Review   Patient understands incorporating nutritional management into lifestyle. Needs Review   Patient undertands incorporating physical activity into lifestyle. Needs Review   Patient understands using medications safely. Needs Review  Pt was taking Glipizide at night but has not been taking recently due to low BG during night with Glipizide   Patient understands monitoring blood glucose, interpreting and using results Needs Review  checks BG's 1-2x/day   Patient understands prevention, detection, and treatment of acute complications. Needs Review   Patient understands prevention, detection, and treatment of chronic complications. Needs Review   Patient understands how to develop strategies to address psychosocial issues. Needs Review   Patient understands how to develop strategies to promote health/change behavior. Needs Review     Complications   Last HgB A1C per patient/outside source 9.9 %  10-08-16   Fasting Blood glucose range (mg/dL) 180-200;130-179;>200   Postprandial Blood glucose range (mg/dL) >200   Have you had a dilated eye exam in the past 12 months? Yes  11-10-16-post op visit after cataract removal right eye on 10-30-16   Have you had a dental exam in the past 12 months? No  years-has full  upper and lower dentures   Are you checking your feet? Yes  has skin lesions on both lower legs and is treating with topical cream from MD   How many days per week are you checking your feet? 7     Dietary Intake   Breakfast --  awakens at 7a-eats breakfast at 10a-11a (usually cereal and milk)   Snack (morning) none   Lunch none   Snack (afternoon) eats occasional salad at [employer] none   Snack (evening) eats fruit, nuts, ice cream or cereal and milk at 7p-8p; eats sweets daily and fried foods 2-3x/wk.   Beverage(s) drinks water 4-5x/day and milk 1-2x/day; beer 3x/wk     Exercise   Exercise Type ADL's     Patient Education   Previous Diabetes Education Yes (please comment)  at [place] about 10 years ago   Disease state  Definition of diabetes, type 1 and 2, and the diagnosis of diabetes;Explored patient's options for treatment of their diabetes   Nutrition management  Role of diet in the treatment of diabetes and  the relationship between the three main macronutrients and blood glucose level;Food label reading, portion sizes and measuring food.   Physical activity and exercise  Role of exercise on diabetes management, blood pressure control and cardiac health.   Medications Reviewed patients medication for diabetes, action, purpose, timing of dose and side effects.   Monitoring Purpose and frequency of SMBG.;Taught/discussed recording of test results and interpretation of SMBG.;Yearly dilated eye exam;Identified  appropriate SMBG and/or A1C goals.   Acute complications Taught treatment of hypoglycemia - the 15 rule.  gave pt medical alert ID card and 1 tube of glucose tablets for PRN use   Chronic complications Relationship between chronic complications and blood glucose control;Assessed and discussed foot care and prevention of foot problems;Lipid levels, blood glucose control and heart disease;Identified and discussed with patient  current chronic complications;Retinopathy and reason for yearly dilated eye exams;Nephropathy, what it is, prevention of, the use of ACE, ARB's and early detection of through urine microalbumia.;Reviewed with patient heart disease, higher risk of, and prevention   Personal strategies to promote health Lifestyle issues that need to be addressed for better diabetes care;Helped patient develop diabetes management plan for (enter comment)     Outcomes   Expected Outcomes Demonstrated interest in learning. Expect positive outcomes      Individualized Plan for Diabetes Self-Management Training:   Learning Objective:  Patient will have a greater understanding of diabetes self-management. Patient education plan is to attend individual and/or group sessions per assessed needs and concerns.   Plan:   Patient Instructions   Check blood sugars 2 x day before breakfast and 2 hrs after breakfast every day  Bring blood sugar records to the next appointment/class  Eat 3 meals day,   1 snacks a day at bedtime  Space meals 4-5 hours apart  Eat 2-3 carbohydrate servings/meal + protein  Eat 1 carbohydrate serving/snack + protein  Avoid sugar sweetened drinks (soda, tea, coffee, sports drinks, juices)  Limit intake of sweets and fried foods  Drink alcohol only with food  Get a Haematologist fast acting glucose and a snack at all times  Carry medical alert ID at all times  Take Glipizide SR every AM at breakfast along with Sitagliptin-Metformin  Return for  appointment/classes on: 11-19-16 at 10:30a   Expected Outcomes:  Demonstrated interest in learning. Expect positive outcomes  Education material provided: General meal planning guidelines, medical alert ID card, 1 tube glucose tablets for PRN use, low BG handout  If problems or questions, patient to contact team via:  [phone]  Future DSME appointment:  11-19-16

## 2016-11-19 ENCOUNTER — Ambulatory Visit: Payer: PRIVATE HEALTH INSURANCE | Admitting: Dietician

## 2016-11-19 ENCOUNTER — Encounter: Payer: Self-pay | Admitting: Dietician

## 2016-11-19 NOTE — Progress Notes (Signed)
Pt cancelled today's FU visit and rescheduled it on 11-26-16

## 2016-11-26 ENCOUNTER — Ambulatory Visit: Payer: PRIVATE HEALTH INSURANCE | Admitting: Dietician

## 2016-12-03 ENCOUNTER — Encounter: Payer: Self-pay | Admitting: Dietician

## 2016-12-03 ENCOUNTER — Encounter: Payer: Medicare Other | Attending: Internal Medicine | Admitting: Dietician

## 2016-12-03 VITALS — BP 140/84 | Wt 166.1 lb

## 2016-12-03 DIAGNOSIS — Z713 Dietary counseling and surveillance: Secondary | ICD-10-CM | POA: Insufficient documentation

## 2016-12-03 DIAGNOSIS — N189 Chronic kidney disease, unspecified: Secondary | ICD-10-CM | POA: Insufficient documentation

## 2016-12-03 DIAGNOSIS — E1136 Type 2 diabetes mellitus with diabetic cataract: Secondary | ICD-10-CM

## 2016-12-03 DIAGNOSIS — E1165 Type 2 diabetes mellitus with hyperglycemia: Secondary | ICD-10-CM | POA: Insufficient documentation

## 2016-12-03 DIAGNOSIS — E1122 Type 2 diabetes mellitus with diabetic chronic kidney disease: Secondary | ICD-10-CM | POA: Diagnosis not present

## 2016-12-03 NOTE — Patient Instructions (Addendum)
  Check blood sugars 2 x day before breakfast and 2 hrs after supper every day Bring blood sugar records to the next appointment/class Exercise: Try walking on treadmill for   10 minutes   3 days a week Try some exercises in Work Out To Go handout Eat 3 meals/day and a small bedtime snack Eat 2-3 carbohydrate servings/meal + protein Eat 1 carbohydrate serving/snack + protein Complete 3 Day Food Record and bring to next appt Carry fast acting glucose and a snack at all times Pick up refill on Janumet today and restart today Take medications as directed-talk to MD about cost of medications Continue to check feet daily-do NOT go barefoot except in shower and in bed Return for appointment/classes on: 12-17-16 with RD

## 2016-12-03 NOTE — Progress Notes (Signed)
Diabetes Self-Management Education  Visit Type: Follow-up  Appt. Start Time: 1030 Appt. End Time: 1130  12/03/2016  [name], identified by name and date of birth, is a 83 y.o. female with a diagnosis of Diabetes:  .   ASSESSMENT  Blood pressure 140/84, weight 166 lb 1.6 oz (75.3 kg). Body mass index is 27.64 kg/m.  Diabetes Self-Management Education - 12/03/16 1319      Visit Information   Visit Type  Follow-up      Complications   How often do you check your blood sugar?  1-2 times/day    Fasting Blood glucose range (mg/dL)  >200;180-200;130-179;70-129    Postprandial Blood glucose range (mg/dL)  180-200;>200;130-179    Have you had a dilated eye exam in the past 12 months?  Yes has appt 12-03-16      Have you had a dental exam in the past 12 months?  -- no teeth-has full upper and lower dentures      Are you checking your feet?  Yes    How many days per week are you checking your feet?  7      Dietary Intake   Breakfast  Eats breakfast     Lunch  eats snack for lunch such as fruit or crackers and peanut butter    Dinner  eats supper     Snack (evening)  eats snack at bedtime of fruit or crackers and peanut butter       Exercise   Exercise Type  ADL's increased walking past 2 wks due to babysitting great 2 yr old grandchild        Patient Education   Nutrition management   Role of diet in the treatment of diabetes and the relationship between the three main macronutrients and blood glucose level;Meal timing in regards to the patients' current diabetes medication.;Carbohydrate counting    Physical activity and exercise   Role of exercise on diabetes management, blood pressure control and cardiac health.;Helped patient identify appropriate exercises in relation to his/her diabetes, diabetes complications and other health issue.    Medications  Reviewed patients medication for diabetes, action, purpose, timing of dose and side effects. pt has been out of  Janumet x4 days-plans to get refill today and restart this pm; pt also reports she does not take BP meds daily due to cost-discussed importance of taking meds as directed by MD and encouraged pt to discuss this with her MD at next app    Monitoring  Purpose and frequency of SMBG.;Identified appropriate SMBG and/or A1C goals.;Taught/discussed recording of test results and interpretation of SMBG.;Daily foot exams;Yearly dilated eye exam    Chronic complications  Relationship between chronic complications and blood glucose control;Assessed and discussed foot care and prevention of foot problems;Identified and discussed with patient  current chronic complications;Retinopathy and reason for yearly dilated eye exams;Reviewed with patient heart disease, higher risk of, and prevention;Nephropathy, what it is, prevention of, the use of ACE, ARB's and early detection of through urine microalbumia.;Lipid levels, blood glucose control and heart disease    Psychosocial adjustment  Role of stress on diabetes;Worked with patient to identify barriers to care and solutions    Personal strategies to promote health  Helped patient develop diabetes management plan for (enter comment);Lifestyle issues that need to be addressed for better diabetes care      Post-Education Assessment   Patient understands the diabetes disease and treatment process.  Demonstrates understanding / competency    Patient understands incorporating nutritional management  into lifestyle.  Demonstrates understanding / competency    Patient undertands incorporating physical activity into lifestyle.  Demonstrates understanding / competency    Patient understands using medications safely.  Demonstrates understanding / competency    Patient understands monitoring blood glucose, interpreting and using results  Demonstrates understanding / competency    Patient understands prevention, detection, and treatment of acute complications.  Demonstrates  understanding / competency    Patient understands prevention, detection, and treatment of chronic complications.  Demonstrates understanding / competency    Patient understands how to develop strategies to address psychosocial issues.  Demonstrates understanding / competency    Patient understands how to develop strategies to promote health/change behavior.  Demonstrates understanding / competency      Outcomes   Program Status  Completed       Individualized Plan for Diabetes Self-Management Training:   Learning Objective:  Patient will have a greater understanding of diabetes self-management. Patient education plan is to attend individual and/or group sessions per assessed needs and concerns.   Plan:   Patient Instructions   Check blood sugars 2 x day before breakfast and 2 hrs after supper every day Bring blood sugar records to the next appointment/class Exercise: Try walking on treadmill for   10 minutes   3 days a week if permitted by MD Try some exercises in Work Out To Go handout Eat 3 meals/day and a small bedtime snack Eat 2-3 carbohydrate servings/meal + protein Eat 1 carbohydrate serving/snack + protein Complete 3 Day Food Record and bring to next appt Carry fast acting glucose and a snack at all times Pick up refill on Janumet today and restart today Take medications as directed-talk to MD about cost of medications Continue to check feet daily-do NOT go barefoot except in shower and in bed Return for appointment/classes on: 12-17-16 with RD   Expected Outcomes:     Education material provided: A1C handout, foot care handout, Kidney care handout, Work Out To Go handout, Living Well With Diabetes booklet  If problems or questions, patient to contact team via:  [phone]  Future DSME appointment:  12-17-16 with RD

## 2016-12-17 ENCOUNTER — Ambulatory Visit: Payer: Self-pay | Admitting: Internal Medicine

## 2016-12-17 ENCOUNTER — Ambulatory Visit: Payer: PRIVATE HEALTH INSURANCE | Admitting: Dietician

## 2016-12-28 ENCOUNTER — Telehealth: Payer: Self-pay | Admitting: Dietician

## 2016-12-28 NOTE — Telephone Encounter (Signed)
Called patient to reschedule appointment which was missed on 12/17/16. Left a voicemail message requesting a call back.

## 2017-01-15 HISTORY — PX: CATARACT EXTRACTION, BILATERAL: SHX1313

## 2017-01-24 ENCOUNTER — Telehealth: Payer: Self-pay | Admitting: Internal Medicine

## 2017-01-24 NOTE — Telephone Encounter (Signed)
Called pt to sched for AWV with Nurse Health Advisor.  C/b #  [phone] on Skype [email or web address] if you have questions ° °

## 2017-01-25 ENCOUNTER — Ambulatory Visit: Payer: PRIVATE HEALTH INSURANCE | Admitting: Dietician

## 2017-02-05 DIAGNOSIS — H2512 Age-related nuclear cataract, left eye: Secondary | ICD-10-CM | POA: Diagnosis not present

## 2017-02-06 LAB — HM DIABETES EYE EXAM

## 2017-02-07 ENCOUNTER — Encounter: Payer: Self-pay | Admitting: Dietician
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
## 2017-02-11 ENCOUNTER — Ambulatory Visit: Payer: Self-pay | Admitting: Internal Medicine

## 2017-02-25 ENCOUNTER — Ambulatory Visit: Payer: Self-pay | Admitting: Internal Medicine

## 2017-03-18 ENCOUNTER — Encounter: Payer: Self-pay | Admitting: Internal Medicine

## 2017-03-18 ENCOUNTER — Ambulatory Visit (INDEPENDENT_AMBULATORY_CARE_PROVIDER_SITE_OTHER): Payer: Medicare Other | Admitting: Internal Medicine

## 2017-03-18 VITALS — BP 146/78 | HR 82 | Resp 16 | Ht 65.0 in | Wt 159.0 lb

## 2017-03-18 DIAGNOSIS — N183 Chronic kidney disease, stage 3 unspecified: Secondary | ICD-10-CM

## 2017-03-18 DIAGNOSIS — E1169 Type 2 diabetes mellitus with other specified complication: Secondary | ICD-10-CM

## 2017-03-18 DIAGNOSIS — E1122 Type 2 diabetes mellitus with diabetic chronic kidney disease: Secondary | ICD-10-CM

## 2017-03-18 DIAGNOSIS — IMO0002 Reserved for concepts with insufficient information to code with codable children: Secondary | ICD-10-CM

## 2017-03-18 DIAGNOSIS — E785 Hyperlipidemia, unspecified: Secondary | ICD-10-CM

## 2017-03-18 DIAGNOSIS — M62838 Other muscle spasm: Secondary | ICD-10-CM | POA: Diagnosis not present

## 2017-03-18 DIAGNOSIS — E1165 Type 2 diabetes mellitus with hyperglycemia: Secondary | ICD-10-CM | POA: Diagnosis not present

## 2017-03-18 DIAGNOSIS — I10 Essential (primary) hypertension: Secondary | ICD-10-CM | POA: Diagnosis not present
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
## 2017-03-19 DIAGNOSIS — M5414 Radiculopathy, thoracic region: Secondary | ICD-10-CM | POA: Diagnosis not present

## 2017-03-19 DIAGNOSIS — M9901 Segmental and somatic dysfunction of cervical region: Secondary | ICD-10-CM | POA: Diagnosis not present

## 2017-03-19 DIAGNOSIS — M40292 Other kyphosis, cervical region: Secondary | ICD-10-CM | POA: Diagnosis not present

## 2017-03-19 DIAGNOSIS — M9902 Segmental and somatic dysfunction of thoracic region: Secondary | ICD-10-CM | POA: Diagnosis not present

## 2017-03-19 LAB — COMPREHENSIVE METABOLIC PANEL
ALT: 15 IU/L (ref 0–32)
AST: 13 IU/L (ref 0–40)
Albumin/Globulin Ratio: 1.1 — ABNORMAL LOW (ref 1.2–2.2)
Albumin: 4.1 g/dL (ref 3.5–4.7)
Alkaline Phosphatase: 106 IU/L (ref 39–117)
BUN/Creatinine Ratio: 18 (ref 12–28)
BUN: 19 mg/dL (ref 8–27)
Bilirubin Total: 0.3 mg/dL (ref 0.0–1.2)
CALCIUM: 9.5 mg/dL (ref 8.7–10.3)
CHLORIDE: 99 mmol/L (ref 96–106)
CO2: 25 mmol/L (ref 20–29)
Creatinine, Ser: 1.08 mg/dL — ABNORMAL HIGH (ref 0.57–1.00)
GFR, EST AFRICAN AMERICAN: 54 mL/min/{1.73_m2} — AB (ref >59)
GFR, EST NON AFRICAN AMERICAN: 47 mL/min/{1.73_m2} — AB (ref >59)
GLUCOSE: 303 mg/dL — AB (ref 65–99)
Globulin, Total: 3.6 g/dL (ref 1.5–4.5)
Potassium: 4.5 mmol/L (ref 3.5–5.2)
Sodium: 139 mmol/L (ref 134–144)
TOTAL PROTEIN: 7.7 g/dL (ref 6.0–8.5)

## 2017-03-19 LAB — HEMOGLOBIN A1C
Est. average glucose Bld gHb Est-mCnc: 255 mg/dL
Hgb A1c MFr Bld: 10.5 % — ABNORMAL HIGH (ref 4.8–5.6)

## 2017-03-20 ENCOUNTER — Telehealth: Payer: Self-pay | Admitting: Internal Medicine

## 2017-03-20 DIAGNOSIS — M40292 Other kyphosis, cervical region: Secondary | ICD-10-CM | POA: Diagnosis not present

## 2017-03-20 DIAGNOSIS — M9901 Segmental and somatic dysfunction of cervical region: Secondary | ICD-10-CM | POA: Diagnosis not present

## 2017-03-20 DIAGNOSIS — M9902 Segmental and somatic dysfunction of thoracic region: Secondary | ICD-10-CM | POA: Diagnosis not present

## 2017-03-20 DIAGNOSIS — M5414 Radiculopathy, thoracic region: Secondary | ICD-10-CM | POA: Diagnosis not present

## 2017-03-20 NOTE — Telephone Encounter (Signed)
Called to schedule AWV with Nurse Health Advisor. °Sheri Ryan °[phone]  °Skype [email or web address]  ° °

## 2017-03-22 DIAGNOSIS — M9901 Segmental and somatic dysfunction of cervical region: Secondary | ICD-10-CM | POA: Diagnosis not present

## 2017-03-22 DIAGNOSIS — M9902 Segmental and somatic dysfunction of thoracic region: Secondary | ICD-10-CM | POA: Diagnosis not present

## 2017-03-22 DIAGNOSIS — M40292 Other kyphosis, cervical region: Secondary | ICD-10-CM | POA: Diagnosis not present

## 2017-03-22 DIAGNOSIS — M5414 Radiculopathy, thoracic region: Secondary | ICD-10-CM | POA: Diagnosis not present

## 2017-03-29 DIAGNOSIS — H34812 Central retinal vein occlusion, left eye, with macular edema: Secondary | ICD-10-CM | POA: Diagnosis not present

## 2017-04-12 DIAGNOSIS — H34812 Central retinal vein occlusion, left eye, with macular edema: Secondary | ICD-10-CM | POA: Diagnosis not present

## 2017-04-15 ENCOUNTER — Other Ambulatory Visit: Payer: Self-pay | Admitting: Surgery

## 2017-04-15 DIAGNOSIS — M50122 Cervical disc disorder at C5-C6 level with radiculopathy: Secondary | ICD-10-CM | POA: Insufficient documentation

## 2017-04-15 DIAGNOSIS — M542 Cervicalgia: Secondary | ICD-10-CM | POA: Diagnosis not present

## 2017-04-15 DIAGNOSIS — M25512 Pain in left shoulder: Secondary | ICD-10-CM | POA: Diagnosis not present

## 2017-04-22 DIAGNOSIS — M5412 Radiculopathy, cervical region: Secondary | ICD-10-CM | POA: Diagnosis not present

## 2017-04-23 ENCOUNTER — Other Ambulatory Visit: Payer: Self-pay | Admitting: Neurology

## 2017-04-23 DIAGNOSIS — R200 Anesthesia of skin: Secondary | ICD-10-CM

## 2017-04-23 DIAGNOSIS — M5412 Radiculopathy, cervical region: Secondary | ICD-10-CM | POA: Insufficient documentation

## 2017-04-26 ENCOUNTER — Ambulatory Visit
Admission: RE | Admit: 2017-04-26 | Discharge: 2017-04-26 | Disposition: A | Discharge status: Home / Self Care | Payer: Medicare Other | Source: Ambulatory Visit | Attending: Surgery | Admitting: Surgery

## 2017-04-26 DIAGNOSIS — M542 Cervicalgia: Secondary | ICD-10-CM

## 2017-04-26 DIAGNOSIS — M25512 Pain in left shoulder: Secondary | ICD-10-CM

## 2017-04-26 DIAGNOSIS — M50122 Cervical disc disorder at C5-C6 level with radiculopathy: Secondary | ICD-10-CM

## 2017-04-30 ENCOUNTER — Ambulatory Visit: Payer: PRIVATE HEALTH INSURANCE

## 2017-04-30 DIAGNOSIS — M25512 Pain in left shoulder: Secondary | ICD-10-CM | POA: Insufficient documentation

## 2017-04-30 DIAGNOSIS — M542 Cervicalgia: Secondary | ICD-10-CM | POA: Insufficient documentation

## 2017-05-02 ENCOUNTER — Ambulatory Visit
Admission: RE | Admit: 2017-05-02 | Discharge: 2017-05-02 | Disposition: A | Discharge status: Home / Self Care | Payer: Medicare Other | Source: Ambulatory Visit | Attending: Neurology | Admitting: Neurology

## 2017-05-02 DIAGNOSIS — M4802 Spinal stenosis, cervical region: Secondary | ICD-10-CM | POA: Insufficient documentation

## 2017-05-02 DIAGNOSIS — M25512 Pain in left shoulder: Secondary | ICD-10-CM | POA: Insufficient documentation

## 2017-05-02 DIAGNOSIS — R200 Anesthesia of skin: Secondary | ICD-10-CM

## 2017-05-02 DIAGNOSIS — M542 Cervicalgia: Secondary | ICD-10-CM | POA: Insufficient documentation

## 2017-05-02 DIAGNOSIS — M50122 Cervical disc disorder at C5-C6 level with radiculopathy: Secondary | ICD-10-CM | POA: Insufficient documentation

## 2017-05-13 DIAGNOSIS — H34812 Central retinal vein occlusion, left eye, with macular edema: Secondary | ICD-10-CM | POA: Diagnosis not present

## 2017-05-16 DIAGNOSIS — R29898 Other symptoms and signs involving the musculoskeletal system: Secondary | ICD-10-CM | POA: Diagnosis not present

## 2017-05-16 DIAGNOSIS — M5412 Radiculopathy, cervical region: Secondary | ICD-10-CM | POA: Diagnosis not present

## 2017-05-17 DIAGNOSIS — G8929 Other chronic pain: Secondary | ICD-10-CM | POA: Diagnosis not present

## 2017-05-17 DIAGNOSIS — M5412 Radiculopathy, cervical region: Secondary | ICD-10-CM | POA: Diagnosis not present

## 2017-05-17 DIAGNOSIS — M25512 Pain in left shoulder: Secondary | ICD-10-CM | POA: Diagnosis not present

## 2017-05-17 DIAGNOSIS — M50122 Cervical disc disorder at C5-C6 level with radiculopathy: Secondary | ICD-10-CM | POA: Diagnosis not present

## 2017-06-13 DIAGNOSIS — M542 Cervicalgia: Secondary | ICD-10-CM | POA: Diagnosis not present

## 2017-06-13 DIAGNOSIS — R293 Abnormal posture: Secondary | ICD-10-CM | POA: Diagnosis not present

## 2017-06-13 DIAGNOSIS — R29898 Other symptoms and signs involving the musculoskeletal system: Secondary | ICD-10-CM | POA: Diagnosis not present

## 2017-06-17 DIAGNOSIS — R29898 Other symptoms and signs involving the musculoskeletal system: Secondary | ICD-10-CM | POA: Diagnosis not present

## 2017-06-24 DIAGNOSIS — R29898 Other symptoms and signs involving the musculoskeletal system: Secondary | ICD-10-CM | POA: Diagnosis not present

## 2017-07-01 DIAGNOSIS — R29898 Other symptoms and signs involving the musculoskeletal system: Secondary | ICD-10-CM | POA: Diagnosis not present

## 2017-07-08 DIAGNOSIS — R29898 Other symptoms and signs involving the musculoskeletal system: Secondary | ICD-10-CM | POA: Diagnosis not present

## 2017-07-16 DIAGNOSIS — R29898 Other symptoms and signs involving the musculoskeletal system: Secondary | ICD-10-CM | POA: Diagnosis not present

## 2017-08-22 ENCOUNTER — Other Ambulatory Visit: Payer: Self-pay | Admitting: Internal Medicine

## 2017-08-22 DIAGNOSIS — I10 Essential (primary) hypertension: Secondary | ICD-10-CM

## 2017-08-23 NOTE — Telephone Encounter (Signed)
Called and left patient VM informing to make appt for next 30 days. Will try again this afternoon.

## 2017-08-26 NOTE — Telephone Encounter (Signed)
Called and left patient another VM informing she needs to be seen in the next 30 days. Unable to reach her and she has yet to call back. Informed her we cannot continue refills If she does not come in for appt for DM.

## 2017-09-07 ENCOUNTER — Other Ambulatory Visit: Payer: Self-pay | Admitting: Internal Medicine

## 2017-09-07 DIAGNOSIS — I10 Essential (primary) hypertension: Secondary | ICD-10-CM

## 2017-09-11 ENCOUNTER — Other Ambulatory Visit: Payer: Self-pay

## 2017-09-11 ENCOUNTER — Telehealth: Payer: Self-pay | Admitting: Internal Medicine

## 2017-09-11 DIAGNOSIS — E119 Type 2 diabetes mellitus without complications: Secondary | ICD-10-CM

## 2017-09-11 DIAGNOSIS — I10 Essential (primary) hypertension: Secondary | ICD-10-CM

## 2017-09-11 MED ORDER — BYSTOLIC 10 MG PO TABS: 10.0000 mg | ORAL_TABLET | Freq: Every day | ORAL | 0 refills | Status: DC

## 2017-09-11 MED ORDER — GLIPIZIDE ER 10 MG PO TB24: 10.0000 mg | ORAL_TABLET | Freq: Every day | ORAL | 0 refills | Status: DC

## 2017-09-11 NOTE — Telephone Encounter (Signed)
Pt needs refill sent in to pharmacy    BYSTOLIC 10 MG tablet [[id number]]   glipiZIDE (GLUCOTROL XL) 10 MG 24 hr tablet [[id number]]

## 2017-09-11 NOTE — Telephone Encounter (Signed)
Sent in patient medications. She does need to make sure to come to upcoming appt that was made.  Thank you.

## 2017-09-14 ENCOUNTER — Other Ambulatory Visit: Payer: Self-pay | Admitting: Internal Medicine

## 2017-09-14 DIAGNOSIS — I10 Essential (primary) hypertension: Secondary | ICD-10-CM

## 2017-09-19 ENCOUNTER — Ambulatory Visit: Payer: Self-pay | Admitting: Internal Medicine

## 2017-09-27 ENCOUNTER — Ambulatory Visit (INDEPENDENT_AMBULATORY_CARE_PROVIDER_SITE_OTHER): Payer: Medicare Other | Admitting: Internal Medicine

## 2017-09-27 ENCOUNTER — Other Ambulatory Visit: Payer: Self-pay

## 2017-09-27 ENCOUNTER — Encounter: Payer: Self-pay | Admitting: Internal Medicine

## 2017-09-27 VITALS — BP 136/82 | HR 72 | Ht 65.0 in | Wt 162.0 lb

## 2017-09-27 DIAGNOSIS — H5440 Blindness, one eye, unspecified eye: Secondary | ICD-10-CM

## 2017-09-27 DIAGNOSIS — E785 Hyperlipidemia, unspecified: Secondary | ICD-10-CM | POA: Diagnosis not present

## 2017-09-27 DIAGNOSIS — M50122 Cervical disc disorder at C5-C6 level with radiculopathy: Secondary | ICD-10-CM

## 2017-09-27 DIAGNOSIS — Z9114 Patient's other noncompliance with medication regimen: Secondary | ICD-10-CM

## 2017-09-27 DIAGNOSIS — I10 Essential (primary) hypertension: Secondary | ICD-10-CM

## 2017-09-27 DIAGNOSIS — E1169 Type 2 diabetes mellitus with other specified complication: Secondary | ICD-10-CM

## 2017-09-27 DIAGNOSIS — IMO0002 Reserved for concepts with insufficient information to code with codable children: Secondary | ICD-10-CM

## 2017-09-27 DIAGNOSIS — E042 Nontoxic multinodular goiter: Secondary | ICD-10-CM | POA: Insufficient documentation

## 2017-09-27 DIAGNOSIS — E1165 Type 2 diabetes mellitus with hyperglycemia: Secondary | ICD-10-CM

## 2017-09-27 DIAGNOSIS — Z598 Other problems related to housing and economic circumstances: Secondary | ICD-10-CM

## 2017-09-27 DIAGNOSIS — E1122 Type 2 diabetes mellitus with diabetic chronic kidney disease: Secondary | ICD-10-CM

## 2017-09-27 DIAGNOSIS — E041 Nontoxic single thyroid nodule: Secondary | ICD-10-CM | POA: Diagnosis not present

## 2017-09-27 DIAGNOSIS — Z599 Problem related to housing and economic circumstances, unspecified: Secondary | ICD-10-CM
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
## 2017-09-27 NOTE — Progress Notes (Signed)
Following message received from Dr. Army Melia:  This patient needs assistance with medication cost and possible with medication management -she is not compliant due to cost, skipping meds to make them last. Her DM is out of control.  You are seeing her next week. FYI   Referrals placed for C3 to assist with financial strains with diabetic medications. Also placed referral for pt to be seen by CCM for diabetes management. Will discuss with patient about possible need for referral to nutrition when she comes for her AWV.

## 2017-09-28 LAB — COMPREHENSIVE METABOLIC PANEL
ALT: 11 IU/L (ref 0–32)
AST: 12 IU/L (ref 0–40)
Albumin/Globulin Ratio: 1.3 (ref 1.2–2.2)
Albumin: 4 g/dL (ref 3.5–4.7)
Alkaline Phosphatase: 94 IU/L (ref 39–117)
BUN/Creatinine Ratio: 19 (ref 12–28)
BUN: 20 mg/dL (ref 8–27)
Bilirubin Total: 0.3 mg/dL (ref 0.0–1.2)
CALCIUM: 9.5 mg/dL (ref 8.7–10.3)
CO2: 22 mmol/L (ref 20–29)
CREATININE: 1.04 mg/dL — AB (ref 0.57–1.00)
Chloride: 103 mmol/L (ref 96–106)
GFR, EST AFRICAN AMERICAN: 56 mL/min/{1.73_m2} — AB (ref >59)
GFR, EST NON AFRICAN AMERICAN: 49 mL/min/{1.73_m2} — AB (ref >59)
GLOBULIN, TOTAL: 3 g/dL (ref 1.5–4.5)
Glucose: 216 mg/dL — ABNORMAL HIGH (ref 65–99)
Potassium: 4.2 mmol/L (ref 3.5–5.2)
Sodium: 143 mmol/L (ref 134–144)
TOTAL PROTEIN: 7 g/dL (ref 6.0–8.5)

## 2017-09-28 LAB — THYROID PANEL WITH TSH
FREE THYROXINE INDEX: 1.8 (ref 1.2–4.9)
T3 UPTAKE RATIO: 27 % (ref 24–39)
T4 TOTAL: 6.8 ug/dL (ref 4.5–12.0)
TSH: 2.84 u[IU]/mL (ref 0.450–4.500)

## 2017-09-28 LAB — HEMOGLOBIN A1C
ESTIMATED AVERAGE GLUCOSE: 226 mg/dL
Hgb A1c MFr Bld: 9.5 % — ABNORMAL HIGH (ref 4.8–5.6)

## 2017-09-30 ENCOUNTER — Telehealth: Payer: Self-pay | Admitting: Internal Medicine

## 2017-09-30 NOTE — Telephone Encounter (Signed)
Spoke with pt to discuss her inability to afford her Diabetes meds. See notes in Referral knb

## 2017-10-01 ENCOUNTER — Ambulatory Visit: Payer: PRIVATE HEALTH INSURANCE

## 2017-10-02 ENCOUNTER — Ambulatory Visit (INDEPENDENT_AMBULATORY_CARE_PROVIDER_SITE_OTHER): Payer: Medicare Other | Admitting: Internal Medicine

## 2017-10-02 ENCOUNTER — Other Ambulatory Visit: Payer: Self-pay | Admitting: Internal Medicine

## 2017-10-02 ENCOUNTER — Encounter: Payer: Self-pay | Admitting: Internal Medicine

## 2017-10-02 ENCOUNTER — Ambulatory Visit (INDEPENDENT_AMBULATORY_CARE_PROVIDER_SITE_OTHER): Payer: Medicare Other

## 2017-10-02 VITALS — BP 138/68 | HR 76 | Ht 65.0 in | Wt 163.0 lb

## 2017-10-02 VITALS — BP 192/82 | HR 76 | Temp 97.6°F | Ht 65.0 in | Wt 163.2 lb

## 2017-10-02 DIAGNOSIS — Z9181 History of falling: Secondary | ICD-10-CM | POA: Diagnosis not present

## 2017-10-02 DIAGNOSIS — M1710 Unilateral primary osteoarthritis, unspecified knee: Secondary | ICD-10-CM | POA: Diagnosis not present

## 2017-10-02 DIAGNOSIS — E1165 Type 2 diabetes mellitus with hyperglycemia: Secondary | ICD-10-CM

## 2017-10-02 DIAGNOSIS — H5440 Blindness, one eye, unspecified eye: Secondary | ICD-10-CM

## 2017-10-02 DIAGNOSIS — E2839 Other primary ovarian failure: Secondary | ICD-10-CM

## 2017-10-02 DIAGNOSIS — Z599 Problem related to housing and economic circumstances, unspecified: Secondary | ICD-10-CM

## 2017-10-02 DIAGNOSIS — M25512 Pain in left shoulder: Secondary | ICD-10-CM

## 2017-10-02 DIAGNOSIS — Z598 Other problems related to housing and economic circumstances: Secondary | ICD-10-CM | POA: Diagnosis not present

## 2017-10-02 DIAGNOSIS — Z7409 Other reduced mobility: Secondary | ICD-10-CM | POA: Diagnosis not present

## 2017-10-02 DIAGNOSIS — Z5989 Other problems related to housing and economic circumstances: Secondary | ICD-10-CM

## 2017-10-02 DIAGNOSIS — M179 Osteoarthritis of knee, unspecified: Secondary | ICD-10-CM

## 2017-10-02 DIAGNOSIS — M50122 Cervical disc disorder at C5-C6 level with radiculopathy: Secondary | ICD-10-CM

## 2017-10-02 DIAGNOSIS — Z748 Other problems related to care provider dependency: Secondary | ICD-10-CM

## 2017-10-02 DIAGNOSIS — E1122 Type 2 diabetes mellitus with diabetic chronic kidney disease: Secondary | ICD-10-CM | POA: Diagnosis not present

## 2017-10-02 DIAGNOSIS — Z0000 Encounter for general adult medical examination without abnormal findings: Secondary | ICD-10-CM

## 2017-10-02 DIAGNOSIS — IMO0002 Reserved for concepts with insufficient information to code with codable children: Secondary | ICD-10-CM

## 2017-10-02 MED ORDER — DULAGLUTIDE 0.75 MG/0.5ML ~~LOC~~ SOAJ: 0.7500 mg | SUBCUTANEOUS | 2 refills | Status: DC

## 2017-10-02 NOTE — Patient Instructions (Signed)
Take both of your diabetic medications in the morning.    Start Trulicity once a week.  Check Blood sugar every morning.  If blood sugar is less than 110 for two days in a row, then stop the glipizide.

## 2017-10-02 NOTE — Addendum Note (Signed)
Addended by: Hardie Pulley, Trenten Watchman J on: 10/02/2017 12:14 PM   Modules accepted: Orders, SmartSet

## 2017-10-02 NOTE — Patient Instructions (Signed)
Sheri Ryan , Thank you for taking time to come for your Medicare Wellness Visit. I appreciate your ongoing commitment to your health goals. Please review the following plan we discussed and let me know if I can assist you in the future.   Screening recommendations/referrals: Colorectal Screening: No longer required Mammogram: No longer required Bone Density: You will receive a call from our office regarding your appointment  Vision and Dental Exams: Recommended annual ophthalmology exams for early detection of glaucoma and other disorders of the eye Recommended annual dental exams for proper oral hygiene  Diabetic Exams: Diabetic Eye Exam: Up to date Diabetic Foot Exam: Up to date  Vaccinations: Influenza vaccine: Declined Pneumococcal vaccine: Declined Tdap vaccine: Declined.  Shingles vaccine: Declined  Advanced directives: Please bring a copy of your POA (Power of Attorney) and/or Living Will to your next appointment.  Goals: Recommend to decrease portion sizes by eating 3 small healthy meals and at least 2 healthy snacks per day.  Next appointment: Please schedule your Annual Wellness Visit with your Nurse Health Advisor in one year.  Preventive Care 53 Years and Older, Female Preventive care refers to lifestyle choices and visits with your health care provider that can promote health and wellness. What does preventive care include?  A yearly physical exam. This is also called an annual well check.  Dental exams once or twice a year.  Routine eye exams. Ask your health care provider how often you should have your eyes checked.  Personal lifestyle choices, including:  Daily care of your teeth and gums.  Regular physical activity.  Eating a healthy diet.  Avoiding tobacco and drug use.  Limiting alcohol use.  Practicing safe sex.  Taking low-dose aspirin every day.  Taking vitamin and mineral supplements as recommended by your health care provider. What  happens during an annual well check? The services and screenings done by your health care provider during your annual well check will depend on your age, overall health, lifestyle risk factors, and family history of disease. Counseling  Your health care provider may ask you questions about your:  Alcohol use.  Tobacco use.  Drug use.  Emotional well-being.  Home and relationship well-being.  Sexual activity.  Eating habits.  History of falls.  Memory and ability to understand (cognition).  Work and work Statistician.  Reproductive health. Screening  You may have the following tests or measurements:  Height, weight, and BMI.  Blood pressure.  Lipid and cholesterol levels. These may be checked every 5 years, or more frequently if you are over 82 years old.  Skin check.  Lung cancer screening. You may have this screening every year starting at age 82 if you have a 30-pack-year history of smoking and currently smoke or have quit within the past 15 years.  Fecal occult blood test (FOBT) of the stool. You may have this test every year starting at age 82.  Flexible sigmoidoscopy or colonoscopy. You may have a sigmoidoscopy every 5 years or a colonoscopy every 10 years starting at age 82.  Hepatitis C blood test.  Hepatitis B blood test.  Sexually transmitted disease (STD) testing.  Diabetes screening. This is done by checking your blood sugar (glucose) after you have not eaten for a while (fasting). You may have this done every 1-3 years.  Bone density scan. This is done to screen for osteoporosis. You may have this done starting at age 82.  Mammogram. This may be done every 1-2 years. Talk to your health  care provider about how often you should have regular mammograms. Talk with your health care provider about your test results, treatment options, and if necessary, the need for more tests. Vaccines  Your health care provider may recommend certain vaccines, such  as:  Influenza vaccine. This is recommended every year.  Tetanus, diphtheria, and acellular pertussis (Tdap, Td) vaccine. You may need a Td booster every 10 years.  Zoster vaccine. You may need this after age 82.  Pneumococcal 13-valent conjugate (PCV13) vaccine. One dose is recommended after age 82.  Pneumococcal polysaccharide (PPSV23) vaccine. One dose is recommended after age 82. Talk to your health care provider about which screenings and vaccines you need and how often you need them. This information is not intended to replace advice given to you by your health care provider. Make sure you discuss any questions you have with your health care provider. Document Released: 01/28/2015 Document Revised: 09/21/2015 Document Reviewed: 11/02/2014 Elsevier Interactive Patient Education  2017 [place] Prevention in the Home Falls can cause injuries. They can happen to people of all ages. There are many things you can do to make your home safe and to help prevent falls. What can I do on the outside of my home?  Regularly fix the edges of walkways and driveways and fix any cracks.  Remove anything that might make you trip as you walk through a door, such as a raised step or threshold.  Trim any bushes or trees on the path to your home.  Use bright outdoor lighting.  Clear any walking paths of anything that might make someone trip, such as rocks or tools.  Regularly check to see if handrails are loose or broken. Make sure that both sides of any steps have handrails.  Any raised decks and porches should have guardrails on the edges.  Have any leaves, snow, or ice cleared regularly.  Use sand or salt on walking paths during winter.  Clean up any spills in your garage right away. This includes oil or grease spills. What can I do in the bathroom?  Use night lights.  Install grab bars by the toilet and in the tub and shower. Do not use towel bars as grab bars.  Use  non-skid mats or decals in the tub or shower.  If you need to sit down in the shower, use a plastic, non-slip stool.  Keep the floor dry. Clean up any water that spills on the floor as soon as it happens.  Remove soap buildup in the tub or shower regularly.  Attach bath mats securely with double-sided non-slip rug tape.  Do not have throw rugs and other things on the floor that can make you trip. What can I do in the bedroom?  Use night lights.  Make sure that you have a light by your bed that is easy to reach.  Do not use any sheets or blankets that are too big for your bed. They should not hang down onto the floor.  Have a firm chair that has side arms. You can use this for support while you get dressed.  Do not have throw rugs and other things on the floor that can make you trip. What can I do in the kitchen?  Clean up any spills right away.  Avoid walking on wet floors.  Keep items that you use a lot in easy-to-reach places.  If you need to reach something above you, use a strong step stool that has a grab  bar.  Keep electrical cords out of the way.  Do not use floor polish or wax that makes floors slippery. If you must use wax, use non-skid floor wax.  Do not have throw rugs and other things on the floor that can make you trip. What can I do with my stairs?  Do not leave any items on the stairs.  Make sure that there are handrails on both sides of the stairs and use them. Fix handrails that are broken or loose. Make sure that handrails are as long as the stairways.  Check any carpeting to make sure that it is firmly attached to the stairs. Fix any carpet that is loose or worn.  Avoid having throw rugs at the top or bottom of the stairs. If you do have throw rugs, attach them to the floor with carpet tape.  Make sure that you have a light switch at the top of the stairs and the bottom of the stairs. If you do not have them, ask someone to add them for you. What  else can I do to help prevent falls?  Wear shoes that:  Do not have high heels.  Have rubber bottoms.  Are comfortable and fit you well.  Are closed at the toe. Do not wear sandals.  If you use a stepladder:  Make sure that it is fully opened. Do not climb a closed stepladder.  Make sure that both sides of the stepladder are locked into place.  Ask someone to hold it for you, if possible.  Clearly mark and make sure that you can see:  Any grab bars or handrails.  First and last steps.  Where the edge of each step is.  Use tools that help you move around (mobility aids) if they are needed. These include:  Canes.  Walkers.  Scooters.  Crutches.  Turn on the lights when you go into a dark area. Replace any light bulbs as soon as they burn out.  Set up your furniture so you have a clear path. Avoid moving your furniture around.  If any of your floors are uneven, fix them.  If there are any pets around you, be aware of where they are.  Review your medicines with your doctor. Some medicines can make you feel dizzy. This can increase your chance of falling. Ask your doctor what other things that you can do to help prevent falls. This information is not intended to replace advice given to you by your health care provider. Make sure you discuss any questions you have with your health care provider. Document Released: 10/28/2008 Document Revised: 06/09/2015 Document Reviewed: 02/05/2014 Elsevier Interactive Patient Education  2017 [employer].
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
## 2017-10-03 ENCOUNTER — Other Ambulatory Visit: Payer: Self-pay | Admitting: Internal Medicine

## 2017-10-03 DIAGNOSIS — E113212 Type 2 diabetes mellitus with mild nonproliferative diabetic retinopathy with macular edema, left eye: Secondary | ICD-10-CM | POA: Diagnosis not present

## 2017-10-03 DIAGNOSIS — E11311 Type 2 diabetes mellitus with unspecified diabetic retinopathy with macular edema: Secondary | ICD-10-CM | POA: Diagnosis not present

## 2017-10-03 DIAGNOSIS — E113293 Type 2 diabetes mellitus with mild nonproliferative diabetic retinopathy without macular edema, bilateral: Secondary | ICD-10-CM | POA: Diagnosis not present

## 2017-10-03 DIAGNOSIS — Z961 Presence of intraocular lens: Secondary | ICD-10-CM | POA: Diagnosis not present

## 2017-10-03 DIAGNOSIS — I10 Essential (primary) hypertension: Secondary | ICD-10-CM

## 2017-10-07 ENCOUNTER — Encounter: Payer: Self-pay | Admitting: Internal Medicine

## 2017-10-07 ENCOUNTER — Telehealth: Payer: Self-pay | Admitting: Pharmacy Technician

## 2017-10-07 ENCOUNTER — Ambulatory Visit
Admission: RE | Admit: 2017-10-07 | Discharge: 2017-10-07 | Disposition: A | Discharge status: Home / Self Care | Payer: Medicare Other | Source: Ambulatory Visit | Attending: Internal Medicine | Admitting: Internal Medicine

## 2017-10-07 DIAGNOSIS — E041 Nontoxic single thyroid nodule: Secondary | ICD-10-CM | POA: Insufficient documentation

## 2017-10-07 NOTE — Telephone Encounter (Signed)
Spoke with patient regarding obtaining medication assistance from [facility] .  Patient has prescription coverage with [employer].  Patient stated that she is not in coverage gap.  Patient struggling with paying for copays on medications.  Informed patient that she does not meet the eligibility criteria for [facility].  Patient stated that she understood.  Suggested patient reach out to [facility] and see if there are other medications that would have lower copays.  Then discuss with her provider to see if her current medications could be switched to those with lower copays.    Also, provided patient with information about [employer].  Patient stated that she was not interested.  Does not want to switch provider.  [facility]

## 2017-10-11 ENCOUNTER — Telehealth: Payer: Self-pay | Admitting: Internal Medicine

## 2017-10-11 NOTE — Telephone Encounter (Signed)
spoke with pt to follow up to conversation from Langley Adie with [facility])  Provided contact information to receive help regarding supplemental insurance plans that could help her afford her Rx & make sure she has the best plan.  [employer] ([place]) [phone]  And also gave the [facility] # as they offer workshops to help seniors with picking insurance plans the next one is in November.  Updating notes in referral knb

## 2017-10-24 ENCOUNTER — Telehealth: Payer: Self-pay | Admitting: Pharmacist

## 2017-10-24 ENCOUNTER — Inpatient Hospital Stay: Admission: RE | Admit: 2017-10-24 | Payer: PRIVATE HEALTH INSURANCE | Source: Ambulatory Visit

## 2017-10-24 ENCOUNTER — Other Ambulatory Visit: Payer: Self-pay

## 2017-10-24 NOTE — Patient Outreach (Addendum)
[facility]) Care Management  10/24/2017  Sheri Ryan 10-28-1929 [id number]  TELEPHONE SCREENING Referral date: 10/11/17 Referral source: primary MD  Referral reason: social work referral, home safety, transportation and medication Insurance: medicare  Telephone call to patient regarding primary MD referral.  HIPAA verified. Explained reason for call.  Patient states she has loss the use of her left arm and she is limited in her care. She reports she is weak when walking.  She states this makes her nervous regarding falls. Patient denies any recent falls.  She reports having a steep driveway that is challenging and concerning regarding falls.  Patient states she needs assistance with transportation due to the limited of her left arm.  Patient request assistance with obtaining grab bars and elevated toilet seat. Patient states she is unable to afford her diabetic medications, trulicity and Janumet.  Patient reports she is taking her medications only every other day because of not being able to afford them.   Patient reports her A1c is 9.0 plus.  Patient reports needing in home assistance with light house keeping due to limited use of her arm.   PLAN: RNCM will refer patient to health coach,  Social worker and pharmacist.   Quinn Plowman RN,BSN,CCM [facility] Telephonic  [phone]

## 2017-10-24 NOTE — Patient Outreach (Signed)
  [facility]) Care Management  [place]   10/24/2017  Sheri Ryan Nov 04, 1929 [id number]  Reason for referral: Medication Assistance  Referral source: Telephonic Case Manager Referral medication(s):Janumet, Trulicity Current insurance: [place] ([employer]) Currently receiving Extra Help:  _0  Yes _1  No _2  Unknown  HPI:  Acid reflux, blindness of left eye, cervical radiculopathy C5-C6, hypertension, hyperlipidemia, gait imbalance, uncontrolled type 2 diabetes  Objective: No Known Allergies  Medications Reviewed Today    Reviewed by Elayne Guerin, [place] (Pharmacist) on 10/24/17 at [place] List Status: <None>  Medication Order Taking? Sig Documenting Provider Last Dose Status Informant  amLODipine (NORVASC) 10 MG tablet [id number] Yes TAKE 1 TABLET(10 MG) BY MOUTH DAILY Glean Hess, MD Taking Active   augmented betamethasone dipropionate (DIPROLENE-AF) 0.05 % ointment [id number] Yes Apply 1 application topically 2 (two) times daily. [provider] Taking Active   BYSTOLIC 10 MG tablet [id number] Yes Take 1 tablet (10 mg total) by mouth daily. Glean Hess, MD Taking Active   Dulaglutide (TRULICITY) 3.42 AJ/6.8TL [facility] [facility] [id number] No Inject 0.75 mg into the skin once a week.  Patient not taking:  Reported on 10/24/2017   Glean Hess, MD Not Taking Active            Med Note Regino Bellow Oct 24, 2017  6:07 PM) Not taking due to cost  glipiZIDE (GLUCOTROL XL) 10 MG 24 hr tablet [id number] Yes Take 1 tablet (10 mg total) by mouth daily with breakfast. Glean Hess, MD Taking Active   lisinopril (PRINIVIL,ZESTRIL) 5 MG tablet [id number] Yes TAKE 1 TABLET(5 MG) BY MOUTH DAILY Glean Hess, MD Taking Active   sitaGLIPtin-metformin (JANUMET) 50-500 MG tablet [id number] Yes Take 1 tablet by mouth daily. Glean Hess, MD Taking Active           Assessment:  Drugs sorted by  system:  Cardiovascular: Amlodipine, Bystolic, Lisinopril,   Topical: Augmented betamethasone dipopionate  Endocrine: Trulicity, Glipizide, Sitagliptin-metformin  Medication Review Findings:  . Adherence-patient reported not using Trulicity due to cost . GAP-patient with diabetes not on a statin (no statin allergy listed )  Medication Assistance Findings:  Extra Help:   _3  Already receiving Full Extra Help  _4  Already receiving Partial Extra Help  _5  Eligible based on reported income and assets  _6  Not Eligible based on reported income and assets  Patient Assistance Programs: 1) Trulicity  made by [employer] o Income requirement met: _7  Yes _8  No _9  Unknown o Troop requirement met:    _10  Yes _11  No  _12  Unknown  <[id number]>_8<\/[id number]>_79  Not applicable        2)  Janumet made by [employer] o Income requirement met: _14  Yes _15  No  _16  Unknown o Troop requirement met:   _17  Yes _18  No   _19  Unknown <[id number]>_6<\/[id number]>_58  Not applicable o    Plan: I will contact [place] to research if the patient's insurance disqualifies her since it is listed as Commercial but she also has Medicare and is not working. Patient said the insurance was through her husband.  Follow up with patient in 1-2 business days and route note to her provider.   Elayne Guerin, PharmD, [place] Clinical Pharmacist [phone]

## 2017-10-25 ENCOUNTER — Other Ambulatory Visit: Payer: Self-pay | Admitting: Pharmacist

## 2017-10-25 ENCOUNTER — Other Ambulatory Visit: Payer: Self-pay

## 2017-10-25 ENCOUNTER — Other Ambulatory Visit: Payer: Self-pay | Admitting: Pharmacy Technician

## 2017-10-25 ENCOUNTER — Other Ambulatory Visit: Payer: Self-pay | Admitting: Internal Medicine

## 2017-10-25 DIAGNOSIS — E119 Type 2 diabetes mellitus without complications: Secondary | ICD-10-CM

## 2017-10-25 DIAGNOSIS — I10 Essential (primary) hypertension: Secondary | ICD-10-CM

## 2017-10-25 NOTE — Patient Outreach (Signed)
[facility]) Care Management  10/25/2017  Sheri Ryan 1929/11/14 [id number]    Received [place] patient assistance referral from [place] for [employer]. Prepared patient portion to be mailed. [place] provider portion [employer] provider portion to Dr. Army Melia.  Will follow up with patient in 5-7 business days to confirm application has been recevied.  Sheri Ryan P. Sheri Ryan, [place] Management [phone]

## 2017-10-25 NOTE — Patient Outreach (Signed)
[place] [facility]) Care Management  [place]   10/25/2017  Byrdie Miyazaki 08-24-1929 [id number]  Reason for referral: Medication Assistance  Referral source: Telephonic Case Manager Referral medication(s):Janumet, Trulicity Current insurance: [employer]) Currently receiving Extra Help:  _0  Yes _1  No _2  Unknown  HPI:  Acid reflux, blindness of left eye, cervical radiculopathy C5-C6, hypertension, hyperlipidemia, gait imbalance, uncontrolled type 2 diabetes  Objective: No Known Allergies  Drugs sorted by system:  Cardiovascular: Amlodipine, Bystolic, Lisinopril,   Topical: Augmented betamethasone dipopionate  Endocrine: Trulicity, Glipizide, Sitagliptin-metformin  Medication Review Findings:   Adherence-patient reported not using Trulicity due to cost  GAP-patient with diabetes not on a statin (no statin allergy listed )  Medication Assistance Findings:  Extra Help:   _3  Already receiving Full Extra Help  _4  Already receiving Partial Extra Help  _5  Eligible based on reported income and assets  _6  Not Eligible based on reported income and assets  Patient Assistance Programs: 1) Tulicity made by [employer] o Income requirement met: _7  Yes _8  No _9  Unknown o Troop requirement met:    _10  Yes _11  No  _12  Unknown  <OBOFPULGSPJSUNHR>_1<\/ACQPEAKLTYVDPBAQ>_56  Not applicable -Patient is requesting a print out from [employer]       2)  [place] made by [employer] o Income requirement met: _14  Yes _15  No  _16  Unknown o Troop requirement met:   _17  Yes _18  No   _19  Unknown <HCSPZZCKICHTVGVS>_2<\/VGCYOYOOJZBFMZUA>_04  Not applicable    Plan: I will route patient assistance letter to [facility] pharmacy technician who will coordinate patient assistance program application process for medications listed above.  [facility] pharmacy technician will assist with obtaining all required documents from both patient and provider(s) and submit application(s) once completed.   Follow up with the patient in 2 weeks.   Elayne Guerin,  PharmD, [place] Clinical Pharmacist [phone]

## 2017-10-28 ENCOUNTER — Other Ambulatory Visit: Payer: Self-pay | Admitting: Pharmacy Technician

## 2017-10-28 ENCOUNTER — Encounter: Payer: Self-pay | Admitting: *Deleted

## 2017-10-28 NOTE — Patient Outreach (Signed)
[facility] [facility]) Care Management  10/28/2017  Farris Blash [birth year] [id number]   Unsuccessful call place to patient's physician Dr Army Melia regarding [place] patient assistance.  Spoke to [place] who confirmed with Ralph Leyden that they had changed their program and will now deliver the medications to the patient's home. Call placed to the doctor's office where a voicemail message was left on the nurse's line stating that information and to confirm how the provider would like to proceed. Left my direct line as well as the RPh's number for further questions or concerns.  Will followup in 5-7 business days about the status of the application if call has not been returned.  Cass Vandermeulen P. Talis Iwan, [place] Management [phone]

## 2017-10-28 NOTE — Patient Outreach (Signed)
[facility]) Care Management  10/28/2017  Sheri Ryan 1929/08/04 [id number]  Incoming call from Sheri Ryan concerning provider portion of patient assistance application for [facility]. Sheri Ryan states she will be unable to complete the provider portion of the [facility] application as the medication (Trulicity) has to be shipped to her office and she is unable to accommodate that request. The mailed Merck patient assistance application for Janumet was also mentioned to Sheri Ryan and she was informed that the [place] could be mailed to the patient's address. Dr Army Ryan stated she would review that application once she had received it in the mail.  Will outreach [place] for followup to see what other options may be available for the patient.  Sheri Ryan P. [name], [place] Management [phone]

## 2017-10-29 ENCOUNTER — Other Ambulatory Visit: Payer: Self-pay | Admitting: *Deleted

## 2017-10-29 NOTE — Patient Outreach (Signed)
[facility]) Care Management  10/29/2017  Sheri Ryan 1929-11-26 [id number]  Patient referred to this social worker by [facility] telephonic RNCM to assist patient with resources for housekeeping, transportation and DME  Per patient, she  cannot raise arm which makes it difficult for her to clean her home like she hs done in the past. Patient resides with her 82 year old husband and 79 year old  Grandson but there is only so much that they can do. Patient's spouse is a retired Engineer, structural  and her grandson is in school. Patient states that her spouse is as supportive as he can. He  transports her to medical appointments due to the limitations with her arm and poor eyesight.  Patient reports having family that are supportive as well but they live in [place] and [place] which has it's limits.  This Education officer, museum discussed the out of pocket expense for in home help and suggested that she discuss her in home needs with her family or church friends. Per patient, she cannot afford to pay out of pocket for assistance.  Per patient, she has available friends through the [place], however feels uncomfortable asking for assistance. " I was always so independent, I do not want to take advantage of anyone" This social worker reinforced the purpose of family and friends and that accepting help is not a negative option. This Education officer, museum provided patient with the contact number for the [facility] [phone] as an option for transportation. Patient also provided with the contact information for [facility] for assistance with grab bars in the bathroom and a raised toilet seat. This Education officer, museum also discussed that raised toilet seats could be found at retail stores as well. Patient verbalized having no additional community resource needs at this time. Patient provided with this social worker's contact information if there are any  future needs or questions in the future. This Education officer, museum will sign off at this time.   Sheri Ryan [facility] Care Management [phone]

## 2017-11-03 ENCOUNTER — Other Ambulatory Visit: Payer: Self-pay | Admitting: Internal Medicine

## 2017-11-03 DIAGNOSIS — I10 Essential (primary) hypertension: Secondary | ICD-10-CM

## 2017-11-04 ENCOUNTER — Other Ambulatory Visit: Payer: Self-pay | Admitting: Pharmacy Technician

## 2017-11-04 NOTE — Patient Outreach (Signed)
[place] [facility]) Care Management  11/04/2017  Sheri Ryan 1929/02/12 [id number]   Successful call to patient, HIPAA identifiers verified. Followup call to inquire if patient had received the patient assistance application for Trulicity & Janumet.  Patient states she received the applications on Saturday and has been looking over them. Inquired if patient had any questions or concerns and patient states she did not. She is planning on filling the applications and placing them in the mail as soon as possible. Confirmed patient had our phone number and my direct line and she stated she did.  Will followup with patient in 7-10 business days if application has not been received.  Maxamilian Amadon P. Mortimer Bair, [place] Management [phone]

## 2017-11-08 ENCOUNTER — Ambulatory Visit: Payer: Self-pay | Admitting: Pharmacist

## 2017-11-14 ENCOUNTER — Other Ambulatory Visit: Payer: Self-pay | Admitting: Pharmacist

## 2017-11-14 NOTE — Patient Outreach (Signed)
[facility]) Care Management  11/14/2017  Sheri Ryan 07/21/1929 [id number]   Patient was called to follow up on medication assistance. HIPAA identifiers were obtained. Patient confirmed she received the application but had not gotten the print out from her pharmacy yet. She said she would be doing that ASAP and then mailing the forms in.  Plan: Route note to [facility] for follow up.   Elayne Guerin, PharmD, [place] Clinical Pharmacist [phone]

## 2017-11-21 ENCOUNTER — Other Ambulatory Visit: Payer: Self-pay | Admitting: Pharmacy Technician

## 2017-11-21 NOTE — Patient Outreach (Signed)
[place] [facility]) Care Management  11/21/2017  Sheri Ryan 09/18/1929 [id number]   Successful outreach call placed to patient regarding her medication assistance applications for Trulicity [employer]) and Janumet [employer]).  Patient stated that she had received all the documents that the application requires and has filled out the applications. She apologized for being slow about the process but said she would get them in the mail either today or tomorrow.  Will followup in 7-10 business days if application has not been received.  Adlene Adduci P. Monserath Neff, [place] Management [phone]

## 2017-11-25 ENCOUNTER — Other Ambulatory Visit: Payer: Self-pay | Admitting: *Deleted

## 2017-11-25 NOTE — Patient Outreach (Signed)
[facility] [facility]) Care Management  11/25/2017  Drisana Schweickert [birth year] [id number]   RN Health Coach attempted follow up outreach call to patient.  Per patient this was not a good time. Patient stated that she would call me back in the morning.  Plan: RN will call patient again within 10 business days is not response.  [place] Care Management [phone]

## 2017-11-28 ENCOUNTER — Other Ambulatory Visit: Payer: Self-pay | Admitting: Pharmacy Technician

## 2017-11-28 NOTE — Patient Outreach (Signed)
[place] [facility]) Care Management  11/28/2017  Mumtaz Lovins Sep 18, 1929 [id number]    Unsuccessful call placed to patient regarding her medication assistance applications for Lilly (Tulicity) & Merck (Janumet).  This is the 2nd call to inquire if patient has mailed back her portion of the applications as stated above. A HIPAA compliant voicemail was left.  Will followup in 5-7 business days if call is not returned.  Yaira Bernardi P. Eulalie Speights, [place] Management [phone]

## 2017-11-28 NOTE — Patient Outreach (Signed)
[place] [facility]) Care Management  11/28/2017  Marleigh Kaylor 01-24-1929 [id number]   Incoming return call received from patient. HIPAA identifiers verfieid.  Mrs. Behrmann was calling to confirm that she had mailed back her portion of the applications today.  Will followup in 7-10 business days if applications have not been received.  Raesha Coonrod P. Janely Gullickson, [place] Management [phone]

## 2017-12-02 ENCOUNTER — Other Ambulatory Visit: Payer: Self-pay | Admitting: Pharmacy Technician

## 2017-12-02 NOTE — Patient Outreach (Signed)
[facility]) Care Management  12/02/2017  Emmerson Shuffield 26-May-1929 [id number]    Successful outreach call placed to patient regarding her patient assistance applications for Merck ([place] (Trulicity).  Informed patient that I would be mailing back her Merck application to her as she forgot to sign in one of the places that her signature was required.  Secondly, unfortunately I had to explain to patient that the [place] application would not be able to be submitted b/c Dr. Army Melia "can not accept medications mailed to my office. The medication must be sent directly to the patient." Unfortunately, Lilly [employer] requires the first fill of the medication to go the dr office and then the dr can fill out a form to direct it to the patient's home. Informed Mrs. Giddings of this information. She expressed concern as to how to get her A1C lowered if she was not able to purchase the Trulicity. I instructed the patient to reach out to her provider for that answer & she said she would be contacting Dr/ Army Melia. Will also route note to [place].  Will followup with patient in 5-7 business days to see if she has received her Merck application back so that she can sign it and remail it.  Starkisha Tullis P. Teiara Baria, [place] Management [phone]

## 2017-12-04 ENCOUNTER — Other Ambulatory Visit: Payer: Self-pay | Admitting: Internal Medicine

## 2017-12-04 ENCOUNTER — Ambulatory Visit: Payer: Self-pay | Admitting: Internal Medicine

## 2017-12-04 DIAGNOSIS — E119 Type 2 diabetes mellitus without complications: Secondary | ICD-10-CM

## 2017-12-04 DIAGNOSIS — I10 Essential (primary) hypertension: Secondary | ICD-10-CM

## 2017-12-10 ENCOUNTER — Other Ambulatory Visit: Payer: Self-pay | Admitting: Pharmacy Technician

## 2017-12-10 NOTE — Patient Outreach (Signed)
[facility]) Care Management  12/10/2017  Sheri Ryan 11/09/1929 [id number]   Unsuccessful outreach call placed to patient in regards to her Merck patient assistance application.  Called patient to inquire if she had received her Merck application that I mailed back to her to sign (she forgot to sign one of the places). Unfortunately patient did not answer the phone. HIPAA compliant voice mail left.  Will followup with patient in 5-7 business days if call is not returned.  Pete Merten P. Sara Selvidge, [place] Management [phone]

## 2017-12-19 ENCOUNTER — Other Ambulatory Visit: Payer: Self-pay | Admitting: Pharmacy Technician

## 2017-12-19 NOTE — Patient Outreach (Signed)
[place] [facility]) Care Management  12/19/2017  Seletha Zimmermann 31-Jul-1929 [id number]    Received back all documentation and necessary signatures from both patient and provider in regards to [facility] patient assistance for Janumet. Submitted completed application via mail to [employer].  Will followup in 10-14 business days with company to inquire if attestation letter has been mailed. Patient is aware that she is to call me when she receives the attestation letter.  Tyera Hansley P. Massimiliano Rohleder, [place] Management [phone]

## 2017-12-26 ENCOUNTER — Other Ambulatory Visit: Payer: Self-pay | Admitting: Internal Medicine

## 2017-12-26 DIAGNOSIS — I10 Essential (primary) hypertension: Secondary | ICD-10-CM

## 2017-12-30 ENCOUNTER — Other Ambulatory Visit: Payer: Self-pay | Admitting: Pharmacy Technician

## 2017-12-30 NOTE — Patient Outreach (Signed)
[place] [facility]) Care Management  12/30/2017  Sheri Ryan [birth year] [id number]    Care coordination call placed to [employer]ck in regards to patient's application for [place].  Spoke to [place] who said they had no received the application yet. He said to try back in 5-7 business days since application was just mailed on Dec 6.  Will followup with Merck in 10-14 business days to confirm application was received.  Dezerae Freiberger P. Aliha Diedrich, [place] Management [phone]

## 2018-01-13 ENCOUNTER — Other Ambulatory Visit: Payer: Self-pay | Admitting: Pharmacy Technician

## 2018-01-13 NOTE — Patient Outreach (Signed)
[place] [facility]) Care Management  01/13/2018  Sheri Ryan [birth year] [id number]   Care coordination call placed to [employer]ck in regards to patient's application for [place].  Spoke to [place] who confirmed the application was received on 12/21 and the attestation letter was mailed to the patient the same day.  Will followup with patient in 10-14 business days to confirm receipt of attestation letter.  Tinamarie Przybylski P. Adyline Huberty, [place] Management [phone]

## 2018-01-24 ENCOUNTER — Other Ambulatory Visit: Payer: Self-pay | Admitting: Pharmacy Technician

## 2018-01-24 NOTE — Patient Outreach (Signed)
[facility]) Care Management  01/24/2018  Meridith Romick 1929/06/27 [id number]  Successful outreach call placed to patient in regards to her Merck patient assistance application for [place].   Mrs. Alroy Dust answered the phone, HIPPA identifiers verified. Mrs. Steinruck informed that she was out to lunch but she had received the letter. She inquired if she could call me back. Informed Mrs. Lemire to call me back at her convenience.  Will followup with patient in 2-3 business days if call is not returned.  Petula Rotolo P. Dianca Owensby, [place] Management [phone]

## 2018-01-28 ENCOUNTER — Encounter: Payer: Self-pay | Admitting: Internal Medicine

## 2018-01-28 ENCOUNTER — Ambulatory Visit: Payer: Self-pay | Admitting: Internal Medicine

## 2018-01-30 ENCOUNTER — Other Ambulatory Visit: Payer: Self-pay | Admitting: Pharmacy Technician

## 2018-01-30 NOTE — Patient Outreach (Signed)
[place] [facility]) Care Management  01/30/2018  Sheri Ryan 1929/09/08 [id number]   Unsuccessful outreach call placed to patient in regards to her Merck patient assistance application for [place].  Unfortunately patient did not answer the phone, HIPAA compliant voicemail left. Was calling patient to go over the attestation letter she had received from [employer].  Will followup with 2nd calla ttempt in 3-5 business days.  Delina Kruczek P. Dariana Garbett, [place] Management [phone]

## 2018-01-31 ENCOUNTER — Other Ambulatory Visit: Payer: Self-pay | Admitting: Pharmacy Technician

## 2018-01-31 NOTE — Patient Outreach (Signed)
[facility] [facility]) Care Management  01/31/2018  Sheri Ryan [birth year] [id number]    Successful outreach call placed to patient in reagards to Merck application for [place].  Patient was returning my call from yesterday and left me a voicemail. Reutnred patient's call, HIPAA identifiers verified.  [name] called to inform me that she had received her attestation letter from [employer] and had a question about the form. Discussed with patient the form so she could answer the questions. [name] states she will fill out the form and place the form and the original application in the mail back to [employer] today.  Will followup with Merck in 10-14 business days to confirm receipt.  [name] P. Blessed Girdner, [place] Management [phone]

## 2018-02-04 ENCOUNTER — Other Ambulatory Visit: Payer: Self-pay | Admitting: *Deleted

## 2018-02-04 NOTE — Patient Outreach (Signed)
[facility]) Care Management  02/04/2018  Sheri Ryan 29-Dec-1929 [id number]   RN Health Coach attempted  outreach call to patient. Phone picked up and disconnected without speaking. RN called phone again and no answer. Per voicemail message the mailbox is full. Unable to leave a message.  Plan: RN will call patient again within 30 days.  [place] Care Management [phone]

## 2018-02-05 ENCOUNTER — Other Ambulatory Visit: Payer: Self-pay | Admitting: Internal Medicine

## 2018-02-05 DIAGNOSIS — I10 Essential (primary) hypertension: Secondary | ICD-10-CM

## 2018-02-13 ENCOUNTER — Other Ambulatory Visit: Payer: Self-pay | Admitting: Pharmacy Technician

## 2018-02-13 NOTE — Patient Outreach (Signed)
[facility]) Care Management  02/13/2018  Sheri Ryan [birth year] [id number]    Care coordination call placed to [employer]ck patient assistance in regards to patient's Janumet application.  Spoke to [place] who informed that patient had been APPROVED as of 02/06/2018 for a full year. She informed that an order had been sent over to [employer] for processing. Spoke to [place] at [employer] who said they had just received the request on Tuesday 02/11/2018. She informed that it typically takes 3 business days for the pharmacy to process the request and then another 7-10 business days for delivery to the patient.  Will followup with patient in 10-14 business days to confirm receipt of medication.  Sheri Ryan P. Aysa Larivee, [place] Management [phone]

## 2018-02-18 ENCOUNTER — Other Ambulatory Visit: Payer: Self-pay | Admitting: *Deleted

## 2018-02-18 NOTE — Patient Outreach (Signed)
[place] [facility]) Care Management  02/18/2018  Sheri Ryan 06-19-1929 [id number]   RN Health Coach attempted follow up outreach call to patient.  Patient was unavailable. Voicemail box full unable to leave message.  Plan: RN will call patient again within 14 days.  [place] Care Management [phone]

## 2018-02-21 ENCOUNTER — Other Ambulatory Visit: Payer: Self-pay | Admitting: Internal Medicine

## 2018-02-21 DIAGNOSIS — I10 Essential (primary) hypertension: Secondary | ICD-10-CM

## 2018-02-21 DIAGNOSIS — E119 Type 2 diabetes mellitus without complications: Secondary | ICD-10-CM

## 2018-02-24 ENCOUNTER — Ambulatory Visit (INDEPENDENT_AMBULATORY_CARE_PROVIDER_SITE_OTHER): Payer: Medicare Other | Admitting: Internal Medicine

## 2018-02-24 ENCOUNTER — Ambulatory Visit: Payer: Self-pay | Admitting: Internal Medicine

## 2018-02-24 ENCOUNTER — Encounter: Payer: Self-pay | Admitting: Internal Medicine

## 2018-02-24 ENCOUNTER — Other Ambulatory Visit: Payer: Self-pay

## 2018-02-24 VITALS — BP 138/70 | HR 80 | Ht 65.0 in | Wt 156.0 lb

## 2018-02-24 DIAGNOSIS — M109 Gout, unspecified: Secondary | ICD-10-CM

## 2018-02-24 DIAGNOSIS — IMO0002 Reserved for concepts with insufficient information to code with codable children: Secondary | ICD-10-CM

## 2018-02-24 DIAGNOSIS — E785 Hyperlipidemia, unspecified: Secondary | ICD-10-CM

## 2018-02-24 DIAGNOSIS — E1122 Type 2 diabetes mellitus with diabetic chronic kidney disease: Secondary | ICD-10-CM | POA: Diagnosis not present

## 2018-02-24 DIAGNOSIS — E1169 Type 2 diabetes mellitus with other specified complication: Secondary | ICD-10-CM | POA: Diagnosis not present

## 2018-02-24 DIAGNOSIS — I10 Essential (primary) hypertension: Secondary | ICD-10-CM | POA: Diagnosis not present

## 2018-02-24 DIAGNOSIS — E1165 Type 2 diabetes mellitus with hyperglycemia: Secondary | ICD-10-CM | POA: Diagnosis not present

## 2018-02-24 MED ORDER — LISINOPRIL 5 MG PO TABS: 5.0000 mg | ORAL_TABLET | Freq: Every day | ORAL | 5 refills | Status: DC

## 2018-02-24 MED ORDER — GLIPIZIDE ER 10 MG PO TB24: 10.0000 mg | ORAL_TABLET | Freq: Every day | ORAL | 5 refills | Status: DC

## 2018-02-24 MED ORDER — BYSTOLIC 10 MG PO TABS: 10.0000 mg | ORAL_TABLET | Freq: Every day | ORAL | 5 refills | Status: DC

## 2018-02-24 MED ORDER — COLCHICINE 0.6 MG PO TABS: 0.6000 mg | ORAL_TABLET | Freq: Two times a day (BID) | ORAL | 1 refills | Status: DC

## 2018-02-24 MED ORDER — AMLODIPINE BESYLATE 10 MG PO TABS: 10.0000 mg | ORAL_TABLET | Freq: Every day | ORAL | 5 refills | Status: DC
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
## 2018-02-25 LAB — COMPREHENSIVE METABOLIC PANEL
ALK PHOS: 95 IU/L (ref 39–117)
ALT: 13 IU/L (ref 0–32)
AST: 15 IU/L (ref 0–40)
Albumin/Globulin Ratio: 1.2 (ref 1.2–2.2)
Albumin: 4 g/dL (ref 3.6–4.6)
BUN/Creatinine Ratio: 18 (ref 12–28)
BUN: 24 mg/dL (ref 8–27)
Bilirubin Total: 0.5 mg/dL (ref 0.0–1.2)
CO2: 22 mmol/L (ref 20–29)
Calcium: 9.5 mg/dL (ref 8.7–10.3)
Chloride: 97 mmol/L (ref 96–106)
Creatinine, Ser: 1.31 mg/dL — ABNORMAL HIGH (ref 0.57–1.00)
GFR calc Af Amer: 43 mL/min/{1.73_m2} — ABNORMAL LOW (ref >59)
GFR calc non Af Amer: 37 mL/min/{1.73_m2} — ABNORMAL LOW (ref >59)
Globulin, Total: 3.3 g/dL (ref 1.5–4.5)
Glucose: 278 mg/dL — ABNORMAL HIGH (ref 65–99)
Potassium: 4.9 mmol/L (ref 3.5–5.2)
Sodium: 138 mmol/L (ref 134–144)
Total Protein: 7.3 g/dL (ref 6.0–8.5)

## 2018-02-25 LAB — CBC WITH DIFFERENTIAL/PLATELET
Basophils Absolute: 0.1 10*3/uL (ref 0.0–0.2)
Basos: 1 %
EOS (ABSOLUTE): 0.2 10*3/uL (ref 0.0–0.4)
Eos: 2 %
Hematocrit: 34.9 % (ref 34.0–46.6)
Hemoglobin: 10.9 g/dL — ABNORMAL LOW (ref 11.1–15.9)
Immature Grans (Abs): 0 10*3/uL (ref 0.0–0.1)
Immature Granulocytes: 0 %
LYMPHS ABS: 2.6 10*3/uL (ref 0.7–3.1)
Lymphs: 21 %
MCH: 25.8 pg — ABNORMAL LOW (ref 26.6–33.0)
MCHC: 31.2 g/dL — AB (ref 31.5–35.7)
MCV: 83 fL (ref 79–97)
Monocytes Absolute: 0.8 10*3/uL (ref 0.1–0.9)
Monocytes: 7 %
NEUTROS ABS: 8.6 10*3/uL — AB (ref 1.4–7.0)
Neutrophils: 69 %
Platelets: 279 10*3/uL (ref 150–450)
RBC: 4.22 x10E6/uL (ref 3.77–5.28)
RDW: 14.4 % (ref 11.7–15.4)
WBC: 12.3 10*3/uL — ABNORMAL HIGH (ref 3.4–10.8)

## 2018-02-25 LAB — HEMOGLOBIN A1C
Est. average glucose Bld gHb Est-mCnc: 226 mg/dL
HEMOGLOBIN A1C: 9.5 % — AB (ref 4.8–5.6)

## 2018-02-25 LAB — URIC ACID: Uric Acid: 7.6 mg/dL — ABNORMAL HIGH (ref 2.5–7.1)

## 2018-02-27 ENCOUNTER — Other Ambulatory Visit: Payer: Self-pay | Admitting: Pharmacy Technician

## 2018-02-27 NOTE — Patient Outreach (Signed)
[facility]) Care Management  02/27/2018  Sheri Ryan August 02, 1929 [id number]    Unsuccessful outreach call placed to patient in regards to her [employer] patient assistance application for [place].  Unfortunately patient did not answer the phone. Was calling patient to inquire if she had received the medication from the patient assistance program.  Will followup with patient in 3-5 business days to inquire.  [name] P. Tayler Heiden, [place] Management [phone]

## 2018-02-27 NOTE — Patient Outreach (Signed)
[facility]) Care Management  02/27/2018  Layce Sprung 1929-06-23 [id number]   Incoming call received from patient, HIPAA identifiers verified.  Patient was returning my call. Inquired if patient had received her Janumet from [employer] and patient stated not yet. Informed patient that she had been approved for the patient assistance through [employer] for [place] for the remainder of the year. Informed patient that the medication should be on its way to her and she should be receiving it either this week or next week. Patient verbalized understanding.   Patient inquired about the Trulicity with [employer]. Informed patient that unfortunately her doctor, Halina Maidens, stated that she was unable to accept the product at the office so therefore we were unable to apply to that program as the medication is shipped to the provider's office. Patient verbalized understanding stating she remembered the provider telling her that.  Lastly patient inquired about needing assistance at home with cleaning. She informed that she had been to the doctor's office and was diagnosed with gout and had started on a new medication. Patient inquired if I could reach out to the LCSW she had been in contact with recently so she could be provided with the information again. Informed patient that I would route a note to the [place] to have her outreach the patient.  Will followup with patient in 7-10 business days to inquire if she has received the [place] with [employer].  Keoshia Steinmetz P. Kaydence Baba, [place] Management [phone]

## 2018-03-04 ENCOUNTER — Ambulatory Visit: Payer: Self-pay | Admitting: *Deleted

## 2018-03-05 ENCOUNTER — Other Ambulatory Visit: Payer: Self-pay | Admitting: Pharmacy Technician

## 2018-03-05 NOTE — Patient Outreach (Signed)
[facility]) Care Management  03/05/2018  Estha Few [birth year] [id number]    Unsuccessful outreach call placed to patient in regards to [place] patient assistance for janumet.  Unfortunately patient did not answer the phone, HIPAA compliant voicemail left. Was calling patient to inquire if she had received her medication from the patient assistance foundation.  Will followup with 2nd phone in 2-3 business days.  Wendelyn Kiesling P. Bren Borys, [place] Management [phone]

## 2018-03-06 ENCOUNTER — Other Ambulatory Visit: Payer: Self-pay | Admitting: Pharmacy Technician

## 2018-03-06 NOTE — Patient Outreach (Signed)
[facility]) Care Management  03/06/2018  [name] Apr 02, 1929 [id number]    Successful outreach call placed to patient in regards to [place] application with [employer].  Spoke to patient, HIPAA identifiers verified. Informed patient that she should be receiving the medication in the next 3-5 business days and if she has not received it then to please contact me. Patient verbalized understanding.  Will followup with patient and/or [employer] patient assistance in 5-7 business [place].  Thurmon Mizell P. Eesa Justiss, [place] Management [phone]

## 2018-03-06 NOTE — Patient Outreach (Signed)
[facility]) Care Management  03/06/2018  Sheri Ryan 11-May-1929 [id number]   Care coordination call placed to [employer] patient assistance for [place].  Spoke to [place] who said patient was approved but could not help me with delivery information so he transferred me to [employer].  Spoke to C[place]ar at [employer] who said the delivery was on hold due to the strength. She said they wanted to make sure it was for Janumet 50/500 1 po qd. Informed Vania Rea that the information she provided was what is written on the application that was submitted. Vania Rea said they would begin processing this. Inquired about the process as patient has been expecting the medication since 02/06/2018 as that is what Daria at [employer] had stated previously and Vania Rea said "pharmacist dont handle the deliveries" but she advised if patient has not heard anything in 3-5 business days then she needs to call them or let someone know.  Will update patient and will followup with patient in 5-7 business days.  Sheri Ryan P. Isiac Breighner, [place] Management [phone]

## 2018-03-10 ENCOUNTER — Other Ambulatory Visit: Payer: Self-pay | Admitting: *Deleted

## 2018-03-10 NOTE — Patient Outreach (Signed)
[facility]) Care Management  03/10/2018  Cattie Tineo 1929-04-13 [id number]   Phone call to patient to provide resources for in home care.  Voicemail message left requesting a return call.  Plan: This Education officer, museum will send patient an Economist. This social worker to make second attempt to call patient within 3-4 business days.     [name] [facility] Care Management [phone]

## 2018-03-12 ENCOUNTER — Other Ambulatory Visit: Payer: Self-pay | Admitting: Pharmacy Technician

## 2018-03-12 NOTE — Patient Outreach (Signed)
[facility]) Care Management  03/12/2018  Sheri Ryan 25-Jul-1929 [id number]   Incoming call received from patient in regards to [employer] patient assistance application for Janumet.  Spoke to patient, HIPAA identifiers verified. Patient was calling to tell me that she had received her Janumet in the mail from [employer] patient assistance. She informed me that she ahd received a 90 days supply of medication. Discussed with patient how to obtain her refills by calling [facility]. Inquired if patient had any other questions or concerns at this time to which she answered no. Patient confirmed having our name and number if any issues were to arise.  Unfortunately we were unable to help patient receive her Trulicity from [place] requires the first fill of the medication to be sent to the provider's office but Dr. Gaspar Cola office was unable to accept medication mailed to the office.  Will remove myself from the care team as patient assistance has been completed.  Will route note to [place] for patient assistance case closure.  Lennox Dolberry P. Hubbert Landrigan, [place] Management [phone]

## 2018-03-13 ENCOUNTER — Other Ambulatory Visit: Payer: Self-pay | Admitting: *Deleted

## 2018-03-13 NOTE — Patient Outreach (Signed)
[facility]) Care Management  03/13/2018  Sheri Ryan 26-Oct-1929 [id number]   Second outreach attempt   Phone call to patient to provide resources for in home care  Voicemail message left for a return call.    Plan: This social worker will outreach patient within 3-4 business days.   [name] [facility] Care Management [phone]

## 2018-03-20 ENCOUNTER — Other Ambulatory Visit: Payer: Self-pay | Admitting: *Deleted

## 2018-03-20 NOTE — Patient Outreach (Signed)
[facility]) Care Management  03/20/2018  Sheri Ryan September 28, 1929 [id number]   Phone call to patient at the request of the [place] to provide resources related to in home help. Per patient, due to the limited use of her hands at times she could use some help cleaning her home. She also requested community resources related to transportion to medical appointments as a back up plan when she is unable to drive.  This social worker discussed local in home care options and the approximate out of pocket costs. Per patient, she has looked into home care options, however is unable to pay out of pocket for assistance. This Education officer, museum discussed additional community options as well as the waiting lists involved. [place] also discussed as a back up transportation option. Patient provided with the contact phone number to schedule a ride if needed in the future. [phone]. Patient verbalized having no additional community resource needs at this time. Patient provided with this social worker's contact information of questions or concerns arise in the future. This Education officer, museum to sign off at this time.    Sheri Ryan [facility] Care Management [phone]

## 2018-03-21 ENCOUNTER — Telehealth: Payer: Self-pay | Admitting: Pharmacist

## 2018-03-21 NOTE — Telephone Encounter (Signed)
-----   Message from Jason Fila, CPhT sent at 03/12/2018  4:27 PM EST ----- Patient received medication. Her med list needs to be updated to reflect the restart of the medication b/c as of 2/10 she reported not taking due to unable to afford medication and it was removed. Also can you send note/letter to Dr Army Melia letting her know patient was approved. Then case can be closed for patient assistance. Sharee Pimple

## 2018-03-21 NOTE — Patient Outreach (Addendum)
[facility]) Care Management  03/21/2018  Sheri Ryan [birth year] [id number]   Dr. Gaspar Cola office was called to clarify the medication list as the patient received Janumet 50-500 from [facility] Patient Assistance Program at no cost. Janumet was discontinued at the 02/24/2018 office visit due to cost.  Since the patient received her shipment of Janumet to her home, she has restarted the medication and her med list was updated accordingly.  Unfortunately, we were not able to help the patient obtain insulin at no cost because [employer] sends the first shipment to the provider's office and the patient's PCP will not accept the shipments.  Plan: Close the patient's pharmacy case.    Elayne Guerin, PharmD, [place] Clinical Pharmacist [phone]

## 2018-03-24 ENCOUNTER — Telehealth: Payer: Self-pay

## 2018-03-24 NOTE — Telephone Encounter (Signed)
Patient called and left message informing that she is now taking her Janumet thanks to patient assistance.   JUST FYI.

## 2018-04-04 ENCOUNTER — Other Ambulatory Visit: Payer: Self-pay

## 2018-04-04 DIAGNOSIS — M109 Gout, unspecified: Secondary | ICD-10-CM

## 2018-04-04 MED ORDER — COLCHICINE 0.6 MG PO TABS: 0.6000 mg | ORAL_TABLET | Freq: Two times a day (BID) | ORAL | 1 refills | Status: DC

## 2018-05-19 ENCOUNTER — Ambulatory Visit: Payer: Self-pay | Admitting: Internal Medicine

## 2018-11-08 ENCOUNTER — Other Ambulatory Visit: Payer: Self-pay | Admitting: Internal Medicine

## 2018-11-08 DIAGNOSIS — I10 Essential (primary) hypertension: Secondary | ICD-10-CM

## 2018-11-13 ENCOUNTER — Other Ambulatory Visit: Payer: Self-pay | Admitting: Internal Medicine

## 2018-11-13 DIAGNOSIS — IMO0002 Reserved for concepts with insufficient information to code with codable children: Secondary | ICD-10-CM

## 2018-11-13 DIAGNOSIS — E1122 Type 2 diabetes mellitus with diabetic chronic kidney disease: Secondary | ICD-10-CM

## 2018-11-13 DIAGNOSIS — I10 Essential (primary) hypertension: Secondary | ICD-10-CM

## 2018-11-25 NOTE — Telephone Encounter (Signed)
Patient is coming on 12/04/2018

## 2018-12-01 ENCOUNTER — Ambulatory Visit (INDEPENDENT_AMBULATORY_CARE_PROVIDER_SITE_OTHER): Payer: Medicare Other

## 2018-12-01 VITALS — Ht 65.0 in | Wt 160.0 lb

## 2018-12-01 DIAGNOSIS — Z0000 Encounter for general adult medical examination without abnormal findings: Secondary | ICD-10-CM | POA: Diagnosis not present

## 2018-12-01 NOTE — Patient Instructions (Signed)
Sheri Ryan , Thank you for taking time to come for your Medicare Wellness Visit. I appreciate your ongoing commitment to your health goals. Please review the following plan we discussed and let me know if I can assist you in the future.   Screening recommendations/referrals: Colonoscopy: no longer required Mammogram: no longer required Bone Density: no longer required Recommended yearly ophthalmology/optometry visit for glaucoma screening and checkup Recommended yearly dental visit for hygiene and checkup  Vaccinations: Influenza vaccine: due Pneumococcal vaccine: due Tdap vaccine: done 11/25/15 Shingles vaccine: Shingrix discussed. Please contact your pharmacy for coverage information.   Advanced directives: Advance directive discussed with you today. I have provided a copy for you to complete at home and have notarized. Once this is complete please bring a copy in to our office so we can scan it into your chart.  Conditions/risks identified: Recommend healthy eating to help lower A1c  Next appointment: Please follow up in one year for your Medicare Annual Wellness visit.     Preventive Care 83 Years and Older, Female Preventive care refers to lifestyle choices and visits with your health care provider that can promote health and wellness. What does preventive care include?  A yearly physical exam. This is also called an annual well check.  Dental exams once or twice a year.  Routine eye exams. Ask your health care provider how often you should have your eyes checked.  Personal lifestyle choices, including:  Daily care of your teeth and gums.  Regular physical activity.  Eating a healthy diet.  Avoiding tobacco and drug use.  Limiting alcohol use.  Practicing safe sex.  Taking low-dose aspirin every day.  Taking vitamin and mineral supplements as recommended by your health care provider. What happens during an annual well check? The services and screenings done  by your health care provider during your annual well check will depend on your age, overall health, lifestyle risk factors, and family history of disease. Counseling  Your health care provider may ask you questions about your:  Alcohol use.  Tobacco use.  Drug use.  Emotional well-being.  Home and relationship well-being.  Sexual activity.  Eating habits.  History of falls.  Memory and ability to understand (cognition).  Work and work Statistician.  Reproductive health. Screening  You may have the following tests or measurements:  Height, weight, and BMI.  Blood pressure.  Lipid and cholesterol levels. These may be checked every 5 years, or more frequently if you are over 79 years old.  Skin check.  Lung cancer screening. You may have this screening every year starting at age 63 if you have a 30-pack-year history of smoking and currently smoke or have quit within the past 15 years.  Fecal occult blood test (FOBT) of the stool. You may have this test every year starting at age 34.  Flexible sigmoidoscopy or colonoscopy. You may have a sigmoidoscopy every 5 years or a colonoscopy every 10 years starting at age 33.  Hepatitis C blood test.  Hepatitis B blood test.  Sexually transmitted disease (STD) testing.  Diabetes screening. This is done by checking your blood sugar (glucose) after you have not eaten for a while (fasting). You may have this done every 1-3 years.  Bone density scan. This is done to screen for osteoporosis. You may have this done starting at age 52.  Mammogram. This may be done every 1-2 years. Talk to your health care provider about how often you should have regular mammograms. Talk with your  health care provider about your test results, treatment options, and if necessary, the need for more tests. Vaccines  Your health care provider may recommend certain vaccines, such as:  Influenza vaccine. This is recommended every year.  Tetanus,  diphtheria, and acellular pertussis (Tdap, Td) vaccine. You may need a Td booster every 10 years.  Zoster vaccine. You may need this after age 58.  Pneumococcal 13-valent conjugate (PCV13) vaccine. One dose is recommended after age 55.  Pneumococcal polysaccharide (PPSV23) vaccine. One dose is recommended after age 17. Talk to your health care provider about which screenings and vaccines you need and how often you need them. This information is not intended to replace advice given to you by your health care provider. Make sure you discuss any questions you have with your health care provider. Document Released: 01/28/2015 Document Revised: 09/21/2015 Document Reviewed: 11/02/2014 Elsevier Interactive Patient Education  2017 [place] Prevention in the Home Falls can cause injuries. They can happen to people of all ages. There are many things you can do to make your home safe and to help prevent falls. What can I do on the outside of my home?  Regularly fix the edges of walkways and driveways and fix any cracks.  Remove anything that might make you trip as you walk through a door, such as a raised step or threshold.  Trim any bushes or trees on the path to your home.  Use bright outdoor lighting.  Clear any walking paths of anything that might make someone trip, such as rocks or tools.  Regularly check to see if handrails are loose or broken. Make sure that both sides of any steps have handrails.  Any raised decks and porches should have guardrails on the edges.  Have any leaves, snow, or ice cleared regularly.  Use sand or salt on walking paths during winter.  Clean up any spills in your garage right away. This includes oil or grease spills. What can I do in the bathroom?  Use night lights.  Install grab bars by the toilet and in the tub and shower. Do not use towel bars as grab bars.  Use non-skid mats or decals in the tub or shower.  If you need to sit down in  the shower, use a plastic, non-slip stool.  Keep the floor dry. Clean up any water that spills on the floor as soon as it happens.  Remove soap buildup in the tub or shower regularly.  Attach bath mats securely with double-sided non-slip rug tape.  Do not have throw rugs and other things on the floor that can make you trip. What can I do in the bedroom?  Use night lights.  Make sure that you have a light by your bed that is easy to reach.  Do not use any sheets or blankets that are too big for your bed. They should not hang down onto the floor.  Have a firm chair that has side arms. You can use this for support while you get dressed.  Do not have throw rugs and other things on the floor that can make you trip. What can I do in the kitchen?  Clean up any spills right away.  Avoid walking on wet floors.  Keep items that you use a lot in easy-to-reach places.  If you need to reach something above you, use a strong step stool that has a grab bar.  Keep electrical cords out of the way.  Do not use  floor polish or wax that makes floors slippery. If you must use wax, use non-skid floor wax.  Do not have throw rugs and other things on the floor that can make you trip. What can I do with my stairs?  Do not leave any items on the stairs.  Make sure that there are handrails on both sides of the stairs and use them. Fix handrails that are broken or loose. Make sure that handrails are as long as the stairways.  Check any carpeting to make sure that it is firmly attached to the stairs. Fix any carpet that is loose or worn.  Avoid having throw rugs at the top or bottom of the stairs. If you do have throw rugs, attach them to the floor with carpet tape.  Make sure that you have a light switch at the top of the stairs and the bottom of the stairs. If you do not have them, ask someone to add them for you. What else can I do to help prevent falls?  Wear shoes that:  Do not have high  heels.  Have rubber bottoms.  Are comfortable and fit you well.  Are closed at the toe. Do not wear sandals.  If you use a stepladder:  Make sure that it is fully opened. Do not climb a closed stepladder.  Make sure that both sides of the stepladder are locked into place.  Ask someone to hold it for you, if possible.  Clearly mark and make sure that you can see:  Any grab bars or handrails.  First and last steps.  Where the edge of each step is.  Use tools that help you move around (mobility aids) if they are needed. These include:  Canes.  Walkers.  Scooters.  Crutches.  Turn on the lights when you go into a dark area. Replace any light bulbs as soon as they burn out.  Set up your furniture so you have a clear path. Avoid moving your furniture around.  If any of your floors are uneven, fix them.  If there are any pets around you, be aware of where they are.  Review your medicines with your doctor. Some medicines can make you feel dizzy. This can increase your chance of falling. Ask your doctor what other things that you can do to help prevent falls. This information is not intended to replace advice given to you by your health care provider. Make sure you discuss any questions you have with your health care provider. Document Released: 10/28/2008 Document Revised: 06/09/2015 Document Reviewed: 02/05/2014 Elsevier Interactive Patient Education  2017 [employer].
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
## 2018-12-04 ENCOUNTER — Ambulatory Visit: Payer: Medicare Other | Admitting: Internal Medicine

## 2018-12-17 ENCOUNTER — Other Ambulatory Visit: Payer: Self-pay | Admitting: Internal Medicine

## 2018-12-17 DIAGNOSIS — I10 Essential (primary) hypertension: Secondary | ICD-10-CM

## 2018-12-25 ENCOUNTER — Other Ambulatory Visit: Payer: Self-pay

## 2018-12-25 ENCOUNTER — Encounter: Payer: Self-pay | Admitting: Internal Medicine

## 2018-12-25 ENCOUNTER — Ambulatory Visit (INDEPENDENT_AMBULATORY_CARE_PROVIDER_SITE_OTHER): Payer: Medicare Other | Admitting: Internal Medicine

## 2018-12-25 VITALS — BP 128/68 | HR 67 | Ht 65.0 in | Wt 167.0 lb

## 2018-12-25 DIAGNOSIS — E1169 Type 2 diabetes mellitus with other specified complication: Secondary | ICD-10-CM | POA: Diagnosis not present

## 2018-12-25 DIAGNOSIS — E785 Hyperlipidemia, unspecified: Secondary | ICD-10-CM | POA: Diagnosis not present

## 2018-12-25 DIAGNOSIS — I10 Essential (primary) hypertension: Secondary | ICD-10-CM

## 2018-12-25 DIAGNOSIS — E118 Type 2 diabetes mellitus with unspecified complications: Secondary | ICD-10-CM

## 2018-12-25 MED ORDER — GLIPIZIDE ER 10 MG PO TB24: 10.0000 mg | ORAL_TABLET | Freq: Every day | ORAL | 5 refills | Status: DC

## 2018-12-25 MED ORDER — LISINOPRIL 5 MG PO TABS: 5.0000 mg | ORAL_TABLET | Freq: Every day | ORAL | 5 refills | Status: DC

## 2018-12-25 MED ORDER — AMLODIPINE BESYLATE 10 MG PO TABS: 10.0000 mg | ORAL_TABLET | Freq: Every day | ORAL | 5 refills | Status: DC

## 2018-12-25 MED ORDER — BYSTOLIC 10 MG PO TABS: 10.0000 mg | ORAL_TABLET | Freq: Every day | ORAL | 5 refills | Status: DC
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
## 2018-12-26 LAB — BASIC METABOLIC PANEL
BUN/Creatinine Ratio: 18 (ref 12–28)
BUN: 23 mg/dL (ref 8–27)
CO2: 20 mmol/L (ref 20–29)
Calcium: 9.6 mg/dL (ref 8.7–10.3)
Chloride: 102 mmol/L (ref 96–106)
Creatinine, Ser: 1.31 mg/dL — ABNORMAL HIGH (ref 0.57–1.00)
GFR calc Af Amer: 42 mL/min/{1.73_m2} — ABNORMAL LOW (ref >59)
GFR calc non Af Amer: 37 mL/min/{1.73_m2} — ABNORMAL LOW (ref >59)
Glucose: 219 mg/dL — ABNORMAL HIGH (ref 65–99)
Potassium: 4.5 mmol/L (ref 3.5–5.2)
Sodium: 139 mmol/L (ref 134–144)

## 2018-12-26 LAB — HEMOGLOBIN A1C
Est. average glucose Bld gHb Est-mCnc: 214 mg/dL
Hgb A1c MFr Bld: 9.1 % — ABNORMAL HIGH (ref 4.8–5.6)

## 2018-12-31 ENCOUNTER — Other Ambulatory Visit: Payer: Self-pay | Admitting: Internal Medicine

## 2018-12-31 DIAGNOSIS — I10 Essential (primary) hypertension: Secondary | ICD-10-CM

## 2018-12-31 DIAGNOSIS — E118 Type 2 diabetes mellitus with unspecified complications: Secondary | ICD-10-CM

## 2018-12-31 DIAGNOSIS — Z20828 Contact with and (suspected) exposure to other viral communicable diseases: Secondary | ICD-10-CM | POA: Diagnosis not present

## 2019-01-12 ENCOUNTER — Ambulatory Visit: Payer: Medicare Other | Attending: Internal Medicine

## 2019-01-12 DIAGNOSIS — Z20822 Contact with and (suspected) exposure to covid-19: Secondary | ICD-10-CM

## 2019-01-14 LAB — NOVEL CORONAVIRUS, NAA: SARS-CoV-2, NAA: NOT DETECTED

## 2019-06-18 ENCOUNTER — Ambulatory Visit: Payer: Medicare Other | Admitting: Internal Medicine

## 2019-07-31 ENCOUNTER — Ambulatory Visit (INDEPENDENT_AMBULATORY_CARE_PROVIDER_SITE_OTHER): Payer: Medicare Other | Admitting: Internal Medicine

## 2019-07-31 ENCOUNTER — Encounter: Payer: Self-pay | Admitting: Internal Medicine

## 2019-07-31 ENCOUNTER — Ambulatory Visit
Admission: RE | Admit: 2019-07-31 | Discharge: 2019-07-31 | Disposition: A | Discharge status: Home / Self Care | Payer: Medicare Other | Source: Ambulatory Visit | Attending: Internal Medicine | Admitting: Internal Medicine

## 2019-07-31 ENCOUNTER — Other Ambulatory Visit: Payer: Self-pay

## 2019-07-31 ENCOUNTER — Ambulatory Visit
Admission: RE | Admit: 2019-07-31 | Discharge: 2019-07-31 | Disposition: A | Discharge status: Home / Self Care | Payer: Medicare Other | Attending: Internal Medicine | Admitting: Internal Medicine

## 2019-07-31 VITALS — BP 232/118 | HR 82 | Temp 98.7°F | Ht 65.0 in | Wt 163.0 lb

## 2019-07-31 DIAGNOSIS — M19041 Primary osteoarthritis, right hand: Secondary | ICD-10-CM | POA: Diagnosis not present

## 2019-07-31 DIAGNOSIS — I10 Essential (primary) hypertension: Secondary | ICD-10-CM

## 2019-07-31 DIAGNOSIS — M79641 Pain in right hand: Secondary | ICD-10-CM

## 2019-07-31 DIAGNOSIS — E118 Type 2 diabetes mellitus with unspecified complications: Secondary | ICD-10-CM | POA: Diagnosis not present

## 2019-07-31 DIAGNOSIS — I872 Venous insufficiency (chronic) (peripheral): Secondary | ICD-10-CM

## 2019-07-31 DIAGNOSIS — M7989 Other specified soft tissue disorders: Secondary | ICD-10-CM | POA: Diagnosis not present

## 2019-07-31 MED ORDER — BETAMETHASONE DIPROPIONATE AUG 0.05 % EX OINT: 1.0000 "application " | TOPICAL_OINTMENT | Freq: Two times a day (BID) | CUTANEOUS | 0 refills | Status: DC

## 2019-07-31 NOTE — Patient Instructions (Signed)
Take 2 Bystolic every day.  Continue the other medications the same.
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
## 2019-08-01 LAB — COMPREHENSIVE METABOLIC PANEL
ALT: 12 IU/L (ref 0–32)
AST: 12 IU/L (ref 0–40)
Albumin/Globulin Ratio: 1.3 (ref 1.2–2.2)
Albumin: 3.9 g/dL (ref 3.6–4.6)
Alkaline Phosphatase: 120 IU/L (ref 48–121)
BUN/Creatinine Ratio: 33 — ABNORMAL HIGH (ref 12–28)
BUN: 42 mg/dL — ABNORMAL HIGH (ref 8–27)
Bilirubin Total: 0.3 mg/dL (ref 0.0–1.2)
CO2: 19 mmol/L — ABNORMAL LOW (ref 20–29)
Calcium: 9.3 mg/dL (ref 8.7–10.3)
Chloride: 100 mmol/L (ref 96–106)
Creatinine, Ser: 1.29 mg/dL — ABNORMAL HIGH (ref 0.57–1.00)
GFR calc Af Amer: 43 mL/min/{1.73_m2} — ABNORMAL LOW (ref >59)
GFR calc non Af Amer: 37 mL/min/{1.73_m2} — ABNORMAL LOW (ref >59)
Globulin, Total: 3.1 g/dL (ref 1.5–4.5)
Glucose: 339 mg/dL — ABNORMAL HIGH (ref 65–99)
Potassium: 5.4 mmol/L — ABNORMAL HIGH (ref 3.5–5.2)
Sodium: 137 mmol/L (ref 134–144)
Total Protein: 7 g/dL (ref 6.0–8.5)

## 2019-08-01 LAB — HEMOGLOBIN A1C
Est. average glucose Bld gHb Est-mCnc: 258 mg/dL
Hgb A1c MFr Bld: 10.6 % — ABNORMAL HIGH (ref 4.8–5.6)

## 2019-08-01 LAB — URIC ACID: Uric Acid: 7.1 mg/dL (ref 3.1–7.9)

## 2019-08-02 ENCOUNTER — Other Ambulatory Visit: Payer: Self-pay | Admitting: Internal Medicine

## 2019-08-02 DIAGNOSIS — E118 Type 2 diabetes mellitus with unspecified complications: Secondary | ICD-10-CM

## 2019-08-02 MED ORDER — SITAGLIPTIN PHOSPHATE 50 MG PO TABS: 50.0000 mg | ORAL_TABLET | Freq: Every day | ORAL | 0 refills | Status: DC

## 2019-08-07 ENCOUNTER — Other Ambulatory Visit: Payer: Self-pay

## 2019-08-07 ENCOUNTER — Ambulatory Visit (INDEPENDENT_AMBULATORY_CARE_PROVIDER_SITE_OTHER): Payer: Medicare Other | Admitting: Internal Medicine

## 2019-08-07 ENCOUNTER — Encounter: Payer: Self-pay | Admitting: Internal Medicine

## 2019-08-07 DIAGNOSIS — I10 Essential (primary) hypertension: Secondary | ICD-10-CM

## 2019-08-07 DIAGNOSIS — M25541 Pain in joints of right hand: Secondary | ICD-10-CM

## 2019-08-07 DIAGNOSIS — E118 Type 2 diabetes mellitus with unspecified complications: Secondary | ICD-10-CM

## 2019-08-07 DIAGNOSIS — M25549 Pain in joints of unspecified hand: Secondary | ICD-10-CM | POA: Insufficient documentation

## 2019-08-07 MED ORDER — BYSTOLIC 10 MG PO TABS: 10.0000 mg | ORAL_TABLET | Freq: Two times a day (BID) | ORAL | 5 refills | Status: DC
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
## 2019-08-11 ENCOUNTER — Other Ambulatory Visit: Payer: Self-pay | Admitting: Pharmacy Technician

## 2019-08-11 NOTE — Patient Outreach (Signed)
[place] [facility]) Care Management  08/11/2019  Arnice Vanepps 1929/02/01 [id number]                                       Medication Assistance Referral  Referral From: [place]   Medication/Company: Celesta Gentile / [employer] Patient application portion:  Education officer, museum portion: Interoffice Mailed to Halina Maidens, MD Provider address/fax verified via: Office website     Follow up:  Will follow up with patient in 5-15 business days to confirm application(s) have been received.  Wen Munford P. Arvis Miguez, [place]  [phone]

## 2019-08-17 ENCOUNTER — Telehealth: Payer: Self-pay | Admitting: Internal Medicine

## 2019-08-20 ENCOUNTER — Other Ambulatory Visit: Payer: Self-pay | Admitting: Pharmacy Technician

## 2019-08-20 ENCOUNTER — Other Ambulatory Visit: Payer: Self-pay | Admitting: Internal Medicine

## 2019-08-20 DIAGNOSIS — I10 Essential (primary) hypertension: Secondary | ICD-10-CM

## 2019-08-20 NOTE — Patient Outreach (Signed)
[place] [facility]) Care Management  08/20/2019  Sheri Ryan May 27, 1929 [id number]   Successful call placed to patient regarding patient assistance application(s) for Januvia with [employer] , HIPAA identifiers verified.   Patient informed she had received the application but has not mailed it back. Patient informed she would work on the application today and mail it back by Friday 08/21/2019.  Follow up:  Will route note to [place]  for case closure if document(s) have not been received in the next 15 business days.  Konner Warrior P. Westlyn Glaza, [place]  [phone]

## 2019-09-01 ENCOUNTER — Other Ambulatory Visit: Payer: Self-pay | Admitting: Internal Medicine

## 2019-09-01 DIAGNOSIS — E118 Type 2 diabetes mellitus with unspecified complications: Secondary | ICD-10-CM

## 2019-09-02 ENCOUNTER — Other Ambulatory Visit: Payer: Self-pay | Admitting: Pharmacy Technician

## 2019-09-02 NOTE — Patient Outreach (Signed)
[place] [facility]) Care Management  09/02/2019  Sheri Ryan [birth year] [id number]  Received both patient and provider portion(s) of patient assistance application(s) for Januvia. Mailed completed application and required documents into [employer].  Will follow up with company(ies) in 10-15 business days to check status of application(s).  [name] P. [name], [place]  [phone]

## 2019-09-10 ENCOUNTER — Other Ambulatory Visit: Payer: Self-pay | Admitting: Pharmacy Technician

## 2019-09-10 NOTE — Patient Outreach (Signed)
[place] [facility]) Care Management  09/10/2019  Sheri Ryan 05/03/1929 [id number]   ADDENDUM   Unsuccessful call placed to patient regarding patient assistance receipt of attestation form from [place] for [place], HIPAA compliant voicemail left.   Was calling to inform patient that the attestation form is being mailed by [employer] to the patient today.   Follow up:  Will follow up with patient in 3-7 business days if call is not returned.  Romulus Hanrahan P. Chanique Duca, [place]  [phone]

## 2019-09-10 NOTE — Patient Outreach (Signed)
[name]) Care Management  09/10/2019  Sheri Ryan 1929/05/16 [id number]  Care coordination call placed to [employer] in regards to patient's application for Januvia.  Spoke to Angelica who informed they received the patient's application on 08/09/2033 and in turn have mailed out today 09/10/2019,  the attestation form and completed application to the patient. She informed the patient would need to complete the attestation form and mail back that form and the original completed application back to [employer] with envelope provided. Once they receive both of those documents back then the application can continue to process.  Will outreach patient with this information.  [name] P. Phylicia Mcgaugh, [place]  [phone]

## 2019-09-10 NOTE — Patient Outreach (Signed)
[facility]) Care Management  09/10/2019  Latresa Gasser [birth year] [id number]   ADDENDUM   Incoming call from patient regarding patient assistance receipt of attestation form from [place] for [employer], [place] identifiers verified.   Informed patient that [employer] had mailed her the attestation along with the orignal application today. Informed patient to call me when she receives the attestation form and that we could go over it togeter. Patient inquired does this mean she is approved as she informed she has been with out the medication for a month.  Informed patient that most of the time it does, however, the final determination will not be made until [employer] receives the documents back. Confirmed patient had name and number.  Follow up:  Since initating patient assistance, Dr. Gaspar Cola clinic now has an embedded RPh Birdena Crandall. Sent PharmD a message updating her about patient.    Will continue to follow patient assistance case.   Will follow up with patient in 7-10 business days if call has not been returned.  Umar Patmon P. Minnie Legros, [place]  [phone]

## 2019-09-16 ENCOUNTER — Other Ambulatory Visit: Payer: Self-pay | Admitting: Pharmacy Technician

## 2019-09-16 NOTE — Patient Outreach (Signed)
[facility]) Care Management  09/16/2019  Sheri Ryan April 04, 1929 [id number]    Return call placed to patient regarding patient assistance receipt of attestation form from [place] for [place], HIPAA compliant voicemail left.   Was returning patient's phone call but unfortunately she did not answer the phone.   Follow up:  Will await a return call.  Hedy Garro P. Ersel Enslin, [place]  [phone]

## 2019-09-17 ENCOUNTER — Other Ambulatory Visit: Payer: Self-pay | Admitting: Internal Medicine

## 2019-09-17 ENCOUNTER — Other Ambulatory Visit: Payer: Self-pay | Admitting: Pharmacy Technician

## 2019-09-17 DIAGNOSIS — I10 Essential (primary) hypertension: Secondary | ICD-10-CM

## 2019-09-17 NOTE — Telephone Encounter (Signed)
Medication Refill - Medication: BYSTOLIC 10 MG tablet   Pt needs 1 bottle, 2 pills a day. 1 pill twice a day, 1 bottle. Not 2 bottles each once a day because that is too expensive for her insurance.  Has the patient contacted their pharmacy? Yes.   (Agent: If no, request that the patient contact the pharmacy for the refill.) (Agent: If yes, when and what did the pharmacy advise?)  Preferred Pharmacy (with phone number or street name):  [facility] [place] [place] - [address]  [place] [address]  Phone: [phone] Fax: [phone]    Agent: Please be advised that RX refills may take up to 3 business days. We ask that you follow-up with your pharmacy.

## 2019-09-17 NOTE — Patient Outreach (Signed)
[facility]) Care Management  09/17/2019  Sheri Ryan [birth year] [id number]   Incoming call receive from patient in regards to [employer] application for [place].  Spoke to patient, HIPAA identifiers verified.  Patient informs she received the attestation form. We discussed the form together. She was able to complete the form while on the line. She informed she would place in mail this weekend.  She also inquired about having to get 2 refills of Bystolic in the same month and wonder what she could do about it. She informed the label on her bottle of Bystolic was reading 1 per day and the qty was 30 and that it cost her 42 and then she would go back in 15 days and refill it again and that was another 42 dollars. Informed her to outreach provider to have them call in a new prescription with the bid dosing to the pharmacy as patient should only have to pay 1x 42 dollar copay for 60 tabs or a 30 days supply.  Will follow up with [employer] in 10-14 business days to inquire if they had made a determination.  Javontay Vandam P. Loyce Klasen, [place]  [phone]

## 2019-09-17 NOTE — Telephone Encounter (Signed)
Last ordered 08/07/19. Patient has multiple refills on file at her pharmacy. Refusing request due to not needed at this time.

## 2019-09-21 ENCOUNTER — Other Ambulatory Visit: Payer: Self-pay | Admitting: Internal Medicine

## 2019-09-21 DIAGNOSIS — I10 Essential (primary) hypertension: Secondary | ICD-10-CM

## 2019-09-22 NOTE — Telephone Encounter (Signed)
Requested Prescriptions  Pending Prescriptions Disp Refills  . lisinopril (ZESTRIL) 5 MG tablet [Pharmacy Med Name: LISINOPRIL 5MG  TABLETS] 30 tablet 1    Sig: TAKE 1 TABLET(5 MG) BY MOUTH DAILY     Cardiovascular:  ACE Inhibitors Failed - 09/21/2019  2:24 PM      Failed - Cr in normal range and within 180 days    Creatinine, Ser  Date Value Ref Range Status  07/31/2019 1.29 (H) 0.57 - 1.00 mg/dL Final         Failed - K in normal range and within 180 days    Potassium  Date Value Ref Range Status  07/31/2019 5.4 (H) 3.5 - 5.2 mmol/L Final         Failed - Last BP in normal range    BP Readings from Last 1 Encounters:  08/07/19 (!) 166/72         Passed - Patient is not pregnant      Passed - Valid encounter within last 6 months    Recent Outpatient Visits          1 month ago Essential (primary) hypertension   [facility] Glean Hess, MD   1 month ago pain in finger   [facility] Glean Hess, MD   9 months ago Hyperlipidemia associated with type 2 diabetes mellitus [facility])   [facility] Glean Hess, MD   1 year ago Essential (primary) hypertension   [facility] Glean Hess, MD   1 year ago Uncontrolled type 2 diabetes mellitus with chronic kidney disease, without long-term current use of insulin [facility])   [place], Laura H, MD      Future Appointments            In 1 month Glean Hess, MD [facility]           . amLODipine ([place]) 10 MG tablet [Pharmacy Med Name: AMLODIPINE BESYLATE 10MG  TABLETS] 30 tablet 1    Sig: TAKE 1 TABLET(10 MG) BY MOUTH DAILY     Cardiovascular:  Calcium Channel Blockers Failed - 09/21/2019  2:24 PM      Failed - Last BP in normal range    BP Readings from Last 1 Encounters:  08/07/19 (!) 166/72         Passed - Valid encounter within last 6 months    Recent Outpatient Visits          1 month ago Essential (primary) hypertension    [facility] Glean Hess, MD   1 month ago pain in finger   [facility] Glean Hess, MD   9 months ago Hyperlipidemia associated with type 2 diabetes mellitus [facility])   [facility] Glean Hess, MD   1 year ago Essential (primary) hypertension   [facility] Glean Hess, MD   1 year ago Uncontrolled type 2 diabetes mellitus with chronic kidney disease, without long-term current use of insulin [facility])   [facility] Glean Hess, MD      Future Appointments            In 1 month Army Melia, Jesse Sans, MD [facility], [facility]

## 2019-09-29 ENCOUNTER — Other Ambulatory Visit: Payer: Self-pay | Admitting: Pharmacy Technician

## 2019-09-29 NOTE — Patient Outreach (Signed)
[facility]) Care Management  09/29/2019  Sheri Ryan 1929/03/20 [id number]  Care coordination call placed to [employer] in regards to patient's Januvia application.  Spoke to [employer] who informed patient was APPROVED 09/28/2019-01/15/2020. She informed an order was sent over to [employer] on 09/28/2019. She informed that could take up to 3 business days to be received an another 7-10 business days to ship.  Will follow up with patient in 10-14 business days to inquire if medication was received.  [name] P. Keirstin Musil, [place]  [phone]

## 2019-10-01 ENCOUNTER — Telehealth: Payer: Self-pay | Admitting: Internal Medicine

## 2019-10-01 NOTE — Telephone Encounter (Signed)
Copied from [name] [phone]. Topic: General - Other >> Oct 01, 2019  2:04 PM Sheri Ryan, [place] C wrote: Reason for CRM: pt called in for advice from provider. Pt says that she received medication Januvia 50 MG from [facility]. Pt would like to make sure that it is okay for her to take medication with her current meds  Please advise.

## 2019-10-01 NOTE — Telephone Encounter (Signed)
I called pt back but cannot leave a message because "mailbox is full"- pt is suppose to be on med

## 2019-10-14 ENCOUNTER — Other Ambulatory Visit: Payer: Self-pay | Admitting: Pharmacy Technician

## 2019-10-14 ENCOUNTER — Other Ambulatory Visit: Payer: Self-pay | Admitting: Internal Medicine

## 2019-10-14 DIAGNOSIS — I10 Essential (primary) hypertension: Secondary | ICD-10-CM

## 2019-10-14 NOTE — Patient Outreach (Addendum)
[facility]) Care Management  10/14/2019  Ciria Bernardini 1929/01/22 [id number]   Unsuccessful call placed to patient regarding patient assistance medication delivery of Januvia  from [employer], HIPAA compliant voicemail left.   Was calling patient to inquire if she had received the medication and to discuss the refill process.  Follow up:  Will follow up with patient in 3-7 business days if call is not returned.  Celedonio Sortino P. Mattie Novosel, [place]  [phone]

## 2019-10-21 ENCOUNTER — Other Ambulatory Visit: Payer: Self-pay | Admitting: Pharmacy Technician

## 2019-10-21 NOTE — Patient Outreach (Signed)
[facility]) Care Management  10/21/2019  Labella Zahradnik 10-24-1929 [id number]    Successful call placed to patient regarding patient assistance medication delivery of Januvia from [employer], HIPAA identifiers verified.   Patient informed she received the Januvia from [employer]. Informed her to call the pharmacy for [employer], [facility], when she needs a refill. Phone number can be found on prescription label on medication bottle or in the paperwork that was sent with the medication. Patient verbalized understanding.  The phone number for [facility] is [phone] and the number for [employer] in [phone].  Patient informed she had placed a call to her provider last week because she was unsure of how to take the medication and if she could take the medication with what she was currently taking. Informed patient to follow up with provider today and she informed she would. Will also place an in basket message to embedded RPh Birdena Crandall as Juluis Rainier.      Follow up:  Will close case as patient assistance has been completed.  Avett Reineck P. Eliakim Tendler, [place]  [phone]

## 2019-10-28 ENCOUNTER — Ambulatory Visit: Payer: Medicare Other | Admitting: Internal Medicine

## 2019-11-25 DIAGNOSIS — I129 Hypertensive chronic kidney disease with stage 1 through stage 4 chronic kidney disease, or unspecified chronic kidney disease: Secondary | ICD-10-CM | POA: Diagnosis not present

## 2019-11-25 DIAGNOSIS — E1122 Type 2 diabetes mellitus with diabetic chronic kidney disease: Secondary | ICD-10-CM | POA: Diagnosis not present

## 2019-11-25 DIAGNOSIS — N182 Chronic kidney disease, stage 2 (mild): Secondary | ICD-10-CM | POA: Diagnosis not present

## 2019-11-25 DIAGNOSIS — R202 Paresthesia of skin: Secondary | ICD-10-CM | POA: Diagnosis not present

## 2019-11-25 DIAGNOSIS — E785 Hyperlipidemia, unspecified: Secondary | ICD-10-CM | POA: Diagnosis not present

## 2019-11-25 DIAGNOSIS — N189 Chronic kidney disease, unspecified: Secondary | ICD-10-CM | POA: Diagnosis not present

## 2019-11-25 DIAGNOSIS — E782 Mixed hyperlipidemia: Secondary | ICD-10-CM | POA: Diagnosis not present

## 2019-11-25 DIAGNOSIS — I10 Essential (primary) hypertension: Secondary | ICD-10-CM | POA: Diagnosis not present

## 2019-12-07 DIAGNOSIS — E1122 Type 2 diabetes mellitus with diabetic chronic kidney disease: Secondary | ICD-10-CM | POA: Diagnosis not present

## 2019-12-07 DIAGNOSIS — E785 Hyperlipidemia, unspecified: Secondary | ICD-10-CM | POA: Diagnosis not present

## 2019-12-07 DIAGNOSIS — I129 Hypertensive chronic kidney disease with stage 1 through stage 4 chronic kidney disease, or unspecified chronic kidney disease: Secondary | ICD-10-CM | POA: Diagnosis not present

## 2019-12-07 DIAGNOSIS — N189 Chronic kidney disease, unspecified: Secondary | ICD-10-CM | POA: Diagnosis not present

## 2019-12-11 ENCOUNTER — Other Ambulatory Visit: Payer: Self-pay | Admitting: Internal Medicine

## 2019-12-11 DIAGNOSIS — E118 Type 2 diabetes mellitus with unspecified complications: Secondary | ICD-10-CM

## 2019-12-19 ENCOUNTER — Other Ambulatory Visit: Payer: Self-pay | Admitting: Internal Medicine

## 2019-12-21 DIAGNOSIS — N189 Chronic kidney disease, unspecified: Secondary | ICD-10-CM | POA: Diagnosis not present

## 2019-12-21 DIAGNOSIS — I129 Hypertensive chronic kidney disease with stage 1 through stage 4 chronic kidney disease, or unspecified chronic kidney disease: Secondary | ICD-10-CM | POA: Diagnosis not present

## 2019-12-21 DIAGNOSIS — G6289 Other specified polyneuropathies: Secondary | ICD-10-CM | POA: Diagnosis not present

## 2019-12-21 DIAGNOSIS — H9190 Unspecified hearing loss, unspecified ear: Secondary | ICD-10-CM | POA: Diagnosis not present

## 2019-12-21 DIAGNOSIS — H6120 Impacted cerumen, unspecified ear: Secondary | ICD-10-CM | POA: Diagnosis not present

## 2019-12-21 DIAGNOSIS — E1122 Type 2 diabetes mellitus with diabetic chronic kidney disease: Secondary | ICD-10-CM | POA: Diagnosis not present

## 2020-01-13 DIAGNOSIS — E1122 Type 2 diabetes mellitus with diabetic chronic kidney disease: Secondary | ICD-10-CM | POA: Diagnosis not present

## 2020-01-13 DIAGNOSIS — H6123 Impacted cerumen, bilateral: Secondary | ICD-10-CM | POA: Diagnosis not present

## 2020-01-13 DIAGNOSIS — N182 Chronic kidney disease, stage 2 (mild): Secondary | ICD-10-CM | POA: Diagnosis not present

## 2020-01-13 DIAGNOSIS — G6289 Other specified polyneuropathies: Secondary | ICD-10-CM | POA: Diagnosis not present

## 2020-01-13 DIAGNOSIS — I10 Essential (primary) hypertension: Secondary | ICD-10-CM | POA: Diagnosis not present

## 2020-01-13 DIAGNOSIS — H903 Sensorineural hearing loss, bilateral: Secondary | ICD-10-CM | POA: Diagnosis not present

## 2020-01-18 ENCOUNTER — Other Ambulatory Visit: Payer: Self-pay | Admitting: Internal Medicine

## 2020-01-29 ENCOUNTER — Other Ambulatory Visit: Payer: Self-pay | Admitting: Infectious Diseases

## 2020-01-29 DIAGNOSIS — E785 Hyperlipidemia, unspecified: Secondary | ICD-10-CM | POA: Diagnosis not present

## 2020-01-29 DIAGNOSIS — I10 Essential (primary) hypertension: Secondary | ICD-10-CM | POA: Diagnosis not present

## 2020-01-29 DIAGNOSIS — N3090 Cystitis, unspecified without hematuria: Secondary | ICD-10-CM | POA: Diagnosis not present

## 2020-01-29 DIAGNOSIS — I129 Hypertensive chronic kidney disease with stage 1 through stage 4 chronic kidney disease, or unspecified chronic kidney disease: Secondary | ICD-10-CM | POA: Diagnosis not present

## 2020-01-29 DIAGNOSIS — N189 Chronic kidney disease, unspecified: Secondary | ICD-10-CM | POA: Diagnosis not present

## 2020-01-29 DIAGNOSIS — N182 Chronic kidney disease, stage 2 (mild): Secondary | ICD-10-CM

## 2020-01-29 DIAGNOSIS — E1142 Type 2 diabetes mellitus with diabetic polyneuropathy: Secondary | ICD-10-CM | POA: Diagnosis not present

## 2020-01-29 DIAGNOSIS — E1122 Type 2 diabetes mellitus with diabetic chronic kidney disease: Secondary | ICD-10-CM

## 2020-01-29 DIAGNOSIS — R600 Localized edema: Secondary | ICD-10-CM | POA: Diagnosis not present

## 2020-02-04 ENCOUNTER — Other Ambulatory Visit: Payer: Self-pay

## 2020-02-04 ENCOUNTER — Ambulatory Visit
Admission: RE | Admit: 2020-02-04 | Discharge: 2020-02-04 | Disposition: A | Discharge status: Home / Self Care | Payer: Medicare Other | Source: Ambulatory Visit | Attending: Infectious Diseases | Admitting: Infectious Diseases

## 2020-02-04 DIAGNOSIS — E1122 Type 2 diabetes mellitus with diabetic chronic kidney disease: Secondary | ICD-10-CM | POA: Diagnosis not present

## 2020-02-04 DIAGNOSIS — N182 Chronic kidney disease, stage 2 (mild): Secondary | ICD-10-CM | POA: Diagnosis not present

## 2020-02-04 DIAGNOSIS — I10 Essential (primary) hypertension: Secondary | ICD-10-CM | POA: Diagnosis not present

## 2020-02-17 ENCOUNTER — Other Ambulatory Visit: Payer: Self-pay | Admitting: Internal Medicine

## 2020-02-17 DIAGNOSIS — I10 Essential (primary) hypertension: Secondary | ICD-10-CM

## 2020-02-19 DIAGNOSIS — N182 Chronic kidney disease, stage 2 (mild): Secondary | ICD-10-CM | POA: Diagnosis not present

## 2020-02-19 DIAGNOSIS — N1831 Chronic kidney disease, stage 3a: Secondary | ICD-10-CM | POA: Diagnosis not present

## 2020-02-19 DIAGNOSIS — I878 Other specified disorders of veins: Secondary | ICD-10-CM | POA: Diagnosis not present

## 2020-02-19 DIAGNOSIS — E782 Mixed hyperlipidemia: Secondary | ICD-10-CM | POA: Diagnosis not present

## 2020-02-19 DIAGNOSIS — I10 Essential (primary) hypertension: Secondary | ICD-10-CM | POA: Diagnosis not present

## 2020-02-19 DIAGNOSIS — E1122 Type 2 diabetes mellitus with diabetic chronic kidney disease: Secondary | ICD-10-CM | POA: Diagnosis not present

## 2020-03-16 DIAGNOSIS — I10 Essential (primary) hypertension: Secondary | ICD-10-CM | POA: Diagnosis not present

## 2020-03-16 DIAGNOSIS — E1122 Type 2 diabetes mellitus with diabetic chronic kidney disease: Secondary | ICD-10-CM | POA: Diagnosis not present

## 2020-03-16 DIAGNOSIS — R8290 Unspecified abnormal findings in urine: Secondary | ICD-10-CM | POA: Diagnosis not present

## 2020-03-16 DIAGNOSIS — R609 Edema, unspecified: Secondary | ICD-10-CM | POA: Diagnosis not present

## 2020-03-16 DIAGNOSIS — R809 Proteinuria, unspecified: Secondary | ICD-10-CM | POA: Diagnosis not present

## 2020-03-16 DIAGNOSIS — N1832 Chronic kidney disease, stage 3b: Secondary | ICD-10-CM | POA: Diagnosis not present

## 2020-03-16 DIAGNOSIS — D631 Anemia in chronic kidney disease: Secondary | ICD-10-CM | POA: Diagnosis not present

## 2020-03-19 ENCOUNTER — Encounter: Payer: Self-pay | Admitting: Emergency Medicine

## 2020-03-19 ENCOUNTER — Emergency Department: Payer: Medicare Other

## 2020-03-19 ENCOUNTER — Other Ambulatory Visit: Payer: Self-pay

## 2020-03-19 ENCOUNTER — Inpatient Hospital Stay
Admission: EM | Admit: 2020-03-19 | Discharge: 2020-03-30 | DRG: 291 | Disposition: A | Discharge status: Home Health | Payer: Medicare Other | Attending: Internal Medicine | Admitting: Internal Medicine

## 2020-03-19 DIAGNOSIS — I080 Rheumatic disorders of both mitral and aortic valves: Secondary | ICD-10-CM | POA: Diagnosis present

## 2020-03-19 DIAGNOSIS — R0602 Shortness of breath: Secondary | ICD-10-CM | POA: Diagnosis not present

## 2020-03-19 DIAGNOSIS — D631 Anemia in chronic kidney disease: Secondary | ICD-10-CM | POA: Diagnosis not present

## 2020-03-19 DIAGNOSIS — Z8249 Family history of ischemic heart disease and other diseases of the circulatory system: Secondary | ICD-10-CM

## 2020-03-19 DIAGNOSIS — Z20822 Contact with and (suspected) exposure to covid-19: Secondary | ICD-10-CM | POA: Diagnosis present

## 2020-03-19 DIAGNOSIS — E1369 Other specified diabetes mellitus with other specified complication: Secondary | ICD-10-CM | POA: Diagnosis present

## 2020-03-19 DIAGNOSIS — N183 Chronic kidney disease, stage 3 unspecified: Secondary | ICD-10-CM | POA: Diagnosis not present

## 2020-03-19 DIAGNOSIS — I5023 Acute on chronic systolic (congestive) heart failure: Secondary | ICD-10-CM | POA: Diagnosis present

## 2020-03-19 DIAGNOSIS — R54 Age-related physical debility: Secondary | ICD-10-CM | POA: Diagnosis present

## 2020-03-19 DIAGNOSIS — E042 Nontoxic multinodular goiter: Secondary | ICD-10-CM | POA: Diagnosis present

## 2020-03-19 DIAGNOSIS — H9190 Unspecified hearing loss, unspecified ear: Secondary | ICD-10-CM | POA: Diagnosis present

## 2020-03-19 DIAGNOSIS — Z7189 Other specified counseling: Secondary | ICD-10-CM | POA: Diagnosis not present

## 2020-03-19 DIAGNOSIS — Z87891 Personal history of nicotine dependence: Secondary | ICD-10-CM

## 2020-03-19 DIAGNOSIS — I129 Hypertensive chronic kidney disease with stage 1 through stage 4 chronic kidney disease, or unspecified chronic kidney disease: Secondary | ICD-10-CM | POA: Diagnosis not present

## 2020-03-19 DIAGNOSIS — R630 Anorexia: Secondary | ICD-10-CM | POA: Diagnosis present

## 2020-03-19 DIAGNOSIS — E876 Hypokalemia: Secondary | ICD-10-CM | POA: Diagnosis not present

## 2020-03-19 DIAGNOSIS — Z7984 Long term (current) use of oral hypoglycemic drugs: Secondary | ICD-10-CM

## 2020-03-19 DIAGNOSIS — M5412 Radiculopathy, cervical region: Secondary | ICD-10-CM | POA: Diagnosis present

## 2020-03-19 DIAGNOSIS — K5900 Constipation, unspecified: Secondary | ICD-10-CM | POA: Diagnosis not present

## 2020-03-19 DIAGNOSIS — E8809 Other disorders of plasma-protein metabolism, not elsewhere classified: Secondary | ICD-10-CM | POA: Diagnosis present

## 2020-03-19 DIAGNOSIS — Z833 Family history of diabetes mellitus: Secondary | ICD-10-CM

## 2020-03-19 DIAGNOSIS — Z515 Encounter for palliative care: Secondary | ICD-10-CM | POA: Diagnosis not present

## 2020-03-19 DIAGNOSIS — J9601 Acute respiratory failure with hypoxia: Secondary | ICD-10-CM | POA: Diagnosis not present

## 2020-03-19 DIAGNOSIS — N179 Acute kidney failure, unspecified: Secondary | ICD-10-CM | POA: Diagnosis present

## 2020-03-19 DIAGNOSIS — E118 Type 2 diabetes mellitus with unspecified complications: Secondary | ICD-10-CM | POA: Diagnosis not present

## 2020-03-19 DIAGNOSIS — R531 Weakness: Secondary | ICD-10-CM | POA: Diagnosis not present

## 2020-03-19 DIAGNOSIS — I509 Heart failure, unspecified: Secondary | ICD-10-CM | POA: Diagnosis not present

## 2020-03-19 DIAGNOSIS — R0603 Acute respiratory distress: Secondary | ICD-10-CM | POA: Diagnosis not present

## 2020-03-19 DIAGNOSIS — J80 Acute respiratory distress syndrome: Secondary | ICD-10-CM | POA: Diagnosis not present

## 2020-03-19 DIAGNOSIS — J811 Chronic pulmonary edema: Secondary | ICD-10-CM

## 2020-03-19 DIAGNOSIS — J159 Unspecified bacterial pneumonia: Secondary | ICD-10-CM | POA: Diagnosis present

## 2020-03-19 DIAGNOSIS — I130 Hypertensive heart and chronic kidney disease with heart failure and stage 1 through stage 4 chronic kidney disease, or unspecified chronic kidney disease: Principal | ICD-10-CM | POA: Diagnosis present

## 2020-03-19 DIAGNOSIS — E11319 Type 2 diabetes mellitus with unspecified diabetic retinopathy without macular edema: Secondary | ICD-10-CM | POA: Diagnosis present

## 2020-03-19 DIAGNOSIS — J9602 Acute respiratory failure with hypercapnia: Secondary | ICD-10-CM | POA: Diagnosis present

## 2020-03-19 DIAGNOSIS — E1165 Type 2 diabetes mellitus with hyperglycemia: Secondary | ICD-10-CM | POA: Diagnosis not present

## 2020-03-19 DIAGNOSIS — I248 Other forms of acute ischemic heart disease: Secondary | ICD-10-CM | POA: Diagnosis present

## 2020-03-19 DIAGNOSIS — G929 Unspecified toxic encephalopathy: Secondary | ICD-10-CM | POA: Diagnosis present

## 2020-03-19 DIAGNOSIS — E1110 Type 2 diabetes mellitus with ketoacidosis without coma: Secondary | ICD-10-CM | POA: Diagnosis not present

## 2020-03-19 DIAGNOSIS — J90 Pleural effusion, not elsewhere classified: Secondary | ICD-10-CM | POA: Diagnosis not present

## 2020-03-19 DIAGNOSIS — J129 Viral pneumonia, unspecified: Secondary | ICD-10-CM | POA: Diagnosis present

## 2020-03-19 DIAGNOSIS — I272 Pulmonary hypertension, unspecified: Secondary | ICD-10-CM | POA: Diagnosis present

## 2020-03-19 DIAGNOSIS — J810 Acute pulmonary edema: Secondary | ICD-10-CM | POA: Diagnosis not present

## 2020-03-19 DIAGNOSIS — D649 Anemia, unspecified: Secondary | ICD-10-CM

## 2020-03-19 DIAGNOSIS — N1832 Chronic kidney disease, stage 3b: Secondary | ICD-10-CM | POA: Diagnosis present

## 2020-03-19 DIAGNOSIS — I517 Cardiomegaly: Secondary | ICD-10-CM | POA: Diagnosis not present

## 2020-03-19 DIAGNOSIS — N189 Chronic kidney disease, unspecified: Secondary | ICD-10-CM | POA: Diagnosis not present

## 2020-03-19 DIAGNOSIS — Z79899 Other long term (current) drug therapy: Secondary | ICD-10-CM

## 2020-03-19 DIAGNOSIS — I5033 Acute on chronic diastolic (congestive) heart failure: Secondary | ICD-10-CM | POA: Diagnosis not present

## 2020-03-19 DIAGNOSIS — E785 Hyperlipidemia, unspecified: Secondary | ICD-10-CM | POA: Diagnosis present

## 2020-03-19 DIAGNOSIS — I10 Essential (primary) hypertension: Secondary | ICD-10-CM | POA: Diagnosis not present

## 2020-03-19 DIAGNOSIS — T501X5A Adverse effect of loop [high-ceiling] diuretics, initial encounter: Secondary | ICD-10-CM | POA: Diagnosis not present

## 2020-03-19 DIAGNOSIS — R042 Hemoptysis: Secondary | ICD-10-CM | POA: Diagnosis not present

## 2020-03-19 DIAGNOSIS — R0902 Hypoxemia: Secondary | ICD-10-CM | POA: Diagnosis not present

## 2020-03-19 DIAGNOSIS — Z6826 Body mass index (BMI) 26.0-26.9, adult: Secondary | ICD-10-CM

## 2020-03-19 DIAGNOSIS — J9690 Respiratory failure, unspecified, unspecified whether with hypoxia or hypercapnia: Secondary | ICD-10-CM | POA: Diagnosis not present

## 2020-03-19 DIAGNOSIS — E1122 Type 2 diabetes mellitus with diabetic chronic kidney disease: Secondary | ICD-10-CM | POA: Diagnosis present

## 2020-03-19 DIAGNOSIS — R0600 Dyspnea, unspecified: Secondary | ICD-10-CM | POA: Diagnosis not present

## 2020-03-19 DIAGNOSIS — H5462 Unqualified visual loss, left eye, normal vision right eye: Secondary | ICD-10-CM | POA: Diagnosis present

## 2020-03-19 DIAGNOSIS — M109 Gout, unspecified: Secondary | ICD-10-CM | POA: Diagnosis present

## 2020-03-19 DIAGNOSIS — I5031 Acute diastolic (congestive) heart failure: Secondary | ICD-10-CM | POA: Diagnosis not present

## 2020-03-19 DIAGNOSIS — R809 Proteinuria, unspecified: Secondary | ICD-10-CM | POA: Diagnosis not present

## 2020-03-19 DIAGNOSIS — R0689 Other abnormalities of breathing: Secondary | ICD-10-CM | POA: Diagnosis not present

## 2020-03-19 DIAGNOSIS — R778 Other specified abnormalities of plasma proteins: Secondary | ICD-10-CM | POA: Diagnosis not present

## 2020-03-19 DIAGNOSIS — I340 Nonrheumatic mitral (valve) insufficiency: Secondary | ICD-10-CM | POA: Diagnosis not present

## 2020-03-19 LAB — BRAIN NATRIURETIC PEPTIDE: B Natriuretic Peptide: 834.7 pg/mL — ABNORMAL HIGH (ref 0.0–100.0)

## 2020-03-19 LAB — BLOOD GAS, ARTERIAL
Acid-base deficit: 3.9 mmol/L — ABNORMAL HIGH (ref 0.0–2.0)
Bicarbonate: 20.4 mmol/L (ref 20.0–28.0)
Delivery systems: POSITIVE
Expiratory PAP: 5
FIO2: 60
Inspiratory PAP: 10
Mechanical Rate: 16
O2 Saturation: 96.1 %
Patient temperature: 37
pCO2 arterial: 33 mmHg (ref 32.0–48.0)
pH, Arterial: 7.4 (ref 7.350–7.450)
pO2, Arterial: 83 mmHg (ref 83.0–108.0)

## 2020-03-19 LAB — GLUCOSE, CAPILLARY
Glucose-Capillary: 362 mg/dL — ABNORMAL HIGH (ref 70–99)
Glucose-Capillary: 307 mg/dL — ABNORMAL HIGH (ref 70–99)
Glucose-Capillary: 247 mg/dL — ABNORMAL HIGH (ref 70–99)
Glucose-Capillary: 206 mg/dL — ABNORMAL HIGH (ref 70–99)
Glucose-Capillary: 159 mg/dL — ABNORMAL HIGH (ref 70–99)
Glucose-Capillary: 153 mg/dL — ABNORMAL HIGH (ref 70–99)
Glucose-Capillary: 149 mg/dL — ABNORMAL HIGH (ref 70–99)
Glucose-Capillary: 134 mg/dL — ABNORMAL HIGH (ref 70–99)
Glucose-Capillary: 142 mg/dL — ABNORMAL HIGH (ref 70–99)
Glucose-Capillary: 140 mg/dL — ABNORMAL HIGH (ref 70–99)
Glucose-Capillary: 132 mg/dL — ABNORMAL HIGH (ref 70–99)

## 2020-03-19 LAB — BASIC METABOLIC PANEL
Anion gap: 13 (ref 5–15)
BUN: 31 mg/dL — ABNORMAL HIGH (ref 8–23)
CO2: 17 mmol/L — ABNORMAL LOW (ref 22–32)
Calcium: 8.4 mg/dL — ABNORMAL LOW (ref 8.9–10.3)
Chloride: 107 mmol/L (ref 98–111)
Creatinine, Ser: 1.93 mg/dL — ABNORMAL HIGH (ref 0.44–1.00)
GFR, Estimated: 25 mL/min — ABNORMAL LOW (ref >60)
Glucose, Bld: 182 mg/dL — ABNORMAL HIGH (ref 70–99)
Potassium: 4.2 mmol/L (ref 3.5–5.1)
Sodium: 137 mmol/L (ref 135–145)

## 2020-03-19 LAB — MAGNESIUM: Magnesium: 2.2 mg/dL (ref 1.7–2.4)

## 2020-03-19 LAB — COMPREHENSIVE METABOLIC PANEL
ALT: 30 U/L (ref 0–44)
AST: 33 U/L (ref 15–41)
Albumin: 3.1 g/dL — ABNORMAL LOW (ref 3.5–5.0)
Alkaline Phosphatase: 85 U/L (ref 38–126)
Anion gap: 17 — ABNORMAL HIGH (ref 5–15)
BUN: 27 mg/dL — ABNORMAL HIGH (ref 8–23)
CO2: 15 mmol/L — ABNORMAL LOW (ref 22–32)
Calcium: 8.3 mg/dL — ABNORMAL LOW (ref 8.9–10.3)
Chloride: 105 mmol/L (ref 98–111)
Creatinine, Ser: 1.72 mg/dL — ABNORMAL HIGH (ref 0.44–1.00)
GFR, Estimated: 28 mL/min — ABNORMAL LOW (ref >60)
Glucose, Bld: 375 mg/dL — ABNORMAL HIGH (ref 70–99)
Potassium: 4 mmol/L (ref 3.5–5.1)
Sodium: 137 mmol/L (ref 135–145)
Total Bilirubin: 0.8 mg/dL (ref 0.3–1.2)
Total Protein: 7.6 g/dL (ref 6.5–8.1)

## 2020-03-19 LAB — TROPONIN I (HIGH SENSITIVITY): Troponin I (High Sensitivity): 34 ng/L — ABNORMAL HIGH (ref <18)

## 2020-03-19 LAB — CBC
HCT: 27.5 % — ABNORMAL LOW (ref 36.0–46.0)
Hemoglobin: 8.8 g/dL — ABNORMAL LOW (ref 12.0–15.0)
MCH: 25.7 pg — ABNORMAL LOW (ref 26.0–34.0)
MCHC: 32 g/dL (ref 30.0–36.0)
MCV: 80.4 fL (ref 80.0–100.0)
Platelets: 330 10*3/uL (ref 150–400)
RBC: 3.42 MIL/uL — ABNORMAL LOW (ref 3.87–5.11)
RDW: 15.9 % — ABNORMAL HIGH (ref 11.5–15.5)
WBC: 15.6 10*3/uL — ABNORMAL HIGH (ref 4.0–10.5)
nRBC: 0 % (ref 0.0–0.2)

## 2020-03-19 LAB — RESP PANEL BY RT-PCR (FLU A&B, COVID) ARPGX2
Influenza A by PCR: NEGATIVE
Influenza B by PCR: NEGATIVE
SARS Coronavirus 2 by RT PCR: NEGATIVE

## 2020-03-19 LAB — CBG MONITORING, ED
Glucose-Capillary: 397 mg/dL — ABNORMAL HIGH (ref 70–99)
Glucose-Capillary: 401 mg/dL — ABNORMAL HIGH (ref 70–99)

## 2020-03-19 LAB — PHOSPHORUS: Phosphorus: 4 mg/dL (ref 2.5–4.6)

## 2020-03-19 LAB — LACTIC ACID, PLASMA
Lactic Acid, Venous: 2.9 mmol/L (ref 0.5–1.9)
Lactic Acid, Venous: 1.5 mmol/L (ref 0.5–1.9)

## 2020-03-19 LAB — MRSA PCR SCREENING: MRSA by PCR: NEGATIVE

## 2020-03-19 MED ORDER — DEXTROSE IN LACTATED RINGERS 5 % IV SOLN: INTRAVENOUS | Status: DC

## 2020-03-19 MED ORDER — CHLORHEXIDINE GLUCONATE CLOTH 2 % EX PADS
6.0000 | MEDICATED_PAD | Freq: Every day | CUTANEOUS | Status: DC
  Administered 2020-03-19 – 2020-03-27 (×8): 6 via TOPICAL

## 2020-03-19 MED ORDER — NITROGLYCERIN IN D5W 200-5 MCG/ML-% IV SOLN
0.0000 ug/min | INTRAVENOUS | Status: DC
  Administered 2020-03-19: 5 ug/min via INTRAVENOUS
  Filled 2020-03-19: qty 250

## 2020-03-19 MED ORDER — FAMOTIDINE IN NACL 20-0.9 MG/50ML-% IV SOLN
20.0000 mg | Freq: Two times a day (BID) | INTRAVENOUS | Status: DC
  Filled 2020-03-19: qty 50

## 2020-03-19 MED ORDER — VANCOMYCIN HCL 1250 MG/250ML IV SOLN
1250.0000 mg | Freq: Once | INTRAVENOUS | Status: AC
  Administered 2020-03-19: 1250 mg via INTRAVENOUS
  Filled 2020-03-19: qty 250

## 2020-03-19 MED ORDER — INSULIN REGULAR(HUMAN) IN NACL 100-0.9 UT/100ML-% IV SOLN
INTRAVENOUS | Status: DC
  Administered 2020-03-19: 9.5 [IU]/h via INTRAVENOUS
  Filled 2020-03-19: qty 100

## 2020-03-19 MED ORDER — POLYETHYLENE GLYCOL 3350 17 G PO PACK
17.0000 g | PACK | Freq: Every day | ORAL | Status: DC | PRN
  Administered 2020-03-28 – 2020-03-29 (×2): 17 g via ORAL
  Filled 2020-03-19 (×2): qty 1

## 2020-03-19 MED ORDER — DEXTROSE 50 % IV SOLN
0.0000 mL | INTRAVENOUS | Status: DC | PRN
  Administered 2020-03-25: 50 mL via INTRAVENOUS
  Filled 2020-03-19: qty 50

## 2020-03-19 MED ORDER — HEPARIN SODIUM (PORCINE) 5000 UNIT/ML IJ SOLN
5000.0000 [IU] | Freq: Three times a day (TID) | INTRAMUSCULAR | Status: DC
  Administered 2020-03-19 – 2020-03-30 (×33): 5000 [IU] via SUBCUTANEOUS
  Filled 2020-03-19 (×33): qty 1

## 2020-03-19 MED ORDER — VANCOMYCIN HCL 750 MG/150ML IV SOLN
750.0000 mg | INTRAVENOUS | Status: DC
  Filled 2020-03-19: qty 150

## 2020-03-19 MED ORDER — IPRATROPIUM-ALBUTEROL 0.5-2.5 (3) MG/3ML IN SOLN
3.0000 mL | RESPIRATORY_TRACT | Status: DC
  Administered 2020-03-19 – 2020-03-23 (×23): 3 mL via RESPIRATORY_TRACT
  Filled 2020-03-19 (×23): qty 3

## 2020-03-19 MED ORDER — LACTATED RINGERS IV SOLN: INTRAVENOUS | Status: DC

## 2020-03-19 MED ORDER — FAMOTIDINE IN NACL 20-0.9 MG/50ML-% IV SOLN
20.0000 mg | INTRAVENOUS | Status: DC
  Administered 2020-03-20 – 2020-03-23 (×4): 20 mg via INTRAVENOUS
  Filled 2020-03-19 (×4): qty 50

## 2020-03-19 MED ORDER — MORPHINE SULFATE (PF) 2 MG/ML IV SOLN
2.0000 mg | Freq: Once | INTRAVENOUS | Status: AC
Start: 2020-03-19 — End: 2020-03-19
  Administered 2020-03-19: 2 mg via INTRAVENOUS

## 2020-03-19 MED ORDER — SODIUM CHLORIDE 0.9% FLUSH: 3.0000 mL | INTRAVENOUS | Status: DC | PRN

## 2020-03-19 MED ORDER — SODIUM CHLORIDE 0.9 % IV SOLN
500.0000 mg | Freq: Once | INTRAVENOUS | Status: AC
  Administered 2020-03-19: 500 mg via INTRAVENOUS
  Filled 2020-03-19: qty 500

## 2020-03-19 MED ORDER — DOCUSATE SODIUM 100 MG PO CAPS
100.0000 mg | ORAL_CAPSULE | Freq: Two times a day (BID) | ORAL | Status: DC | PRN
  Administered 2020-03-28 – 2020-03-29 (×2): 100 mg via ORAL
  Filled 2020-03-19 (×2): qty 1

## 2020-03-19 MED ORDER — ACETAMINOPHEN 325 MG PO TABS: 650.0000 mg | ORAL_TABLET | ORAL | Status: DC | PRN

## 2020-03-19 MED ORDER — FUROSEMIDE 10 MG/ML IJ SOLN
40.0000 mg | Freq: Once | INTRAMUSCULAR | Status: AC
  Administered 2020-03-19: 40 mg via INTRAVENOUS
  Filled 2020-03-19: qty 4

## 2020-03-19 MED ORDER — SODIUM CHLORIDE 0.9 % IV SOLN
2.0000 g | Freq: Once | INTRAVENOUS | Status: AC
  Administered 2020-03-19: 2 g via INTRAVENOUS
  Filled 2020-03-19: qty 2

## 2020-03-19 MED ORDER — SODIUM CHLORIDE 0.9 % IV SOLN
250.0000 mL | INTRAVENOUS | Status: DC | PRN
  Administered 2020-03-20: 250 mL via INTRAVENOUS

## 2020-03-19 MED ORDER — SODIUM CHLORIDE 0.9 % IV SOLN
2.0000 g | INTRAVENOUS | Status: DC
  Administered 2020-03-20 – 2020-03-22 (×3): 2 g via INTRAVENOUS
  Filled 2020-03-19 (×2): qty 2
  Filled 2020-03-19 (×2): qty 1.22

## 2020-03-19 MED ORDER — VANCOMYCIN HCL IN DEXTROSE 1-5 GM/200ML-% IV SOLN: 1000.0000 mg | Freq: Once | INTRAVENOUS | Status: DC

## 2020-03-19 MED ORDER — POTASSIUM CHLORIDE 10 MEQ/100ML IV SOLN
10.0000 meq | INTRAVENOUS | Status: AC
  Administered 2020-03-19: 10 meq via INTRAVENOUS
  Filled 2020-03-19: qty 100

## 2020-03-19 MED ORDER — INSULIN GLARGINE 100 UNIT/ML ~~LOC~~ SOLN
10.0000 [IU] | Freq: Every day | SUBCUTANEOUS | Status: DC
  Administered 2020-03-19 – 2020-03-24 (×6): 10 [IU] via SUBCUTANEOUS
  Filled 2020-03-19 (×8): qty 0.1

## 2020-03-19 MED ORDER — MORPHINE SULFATE (PF) 2 MG/ML IV SOLN
INTRAVENOUS | Status: AC
  Filled 2020-03-19: qty 1

## 2020-03-19 MED ORDER — INSULIN ASPART 100 UNIT/ML ~~LOC~~ SOLN
0.0000 [IU] | SUBCUTANEOUS | Status: DC
  Administered 2020-03-19: 3 [IU] via SUBCUTANEOUS
  Administered 2020-03-20: 4 [IU] via SUBCUTANEOUS
  Administered 2020-03-20 – 2020-03-21 (×2): 3 [IU] via SUBCUTANEOUS
  Administered 2020-03-21 – 2020-03-22 (×2): 4 [IU] via SUBCUTANEOUS
  Administered 2020-03-22: 3 [IU] via SUBCUTANEOUS
  Administered 2020-03-22 – 2020-03-23 (×2): 4 [IU] via SUBCUTANEOUS
  Administered 2020-03-23: 3 [IU] via SUBCUTANEOUS
  Administered 2020-03-23: 7 [IU] via SUBCUTANEOUS
  Administered 2020-03-24 (×2): 4 [IU] via SUBCUTANEOUS
  Filled 2020-03-19 (×13): qty 1

## 2020-03-19 MED ORDER — ONDANSETRON HCL 4 MG/2ML IJ SOLN: 4.0000 mg | Freq: Four times a day (QID) | INTRAMUSCULAR | Status: DC | PRN

## 2020-03-19 MED ORDER — BUDESONIDE 0.5 MG/2ML IN SUSP
0.5000 mg | Freq: Two times a day (BID) | RESPIRATORY_TRACT | Status: DC
  Administered 2020-03-19 – 2020-03-23 (×9): 0.5 mg via RESPIRATORY_TRACT
  Filled 2020-03-19 (×9): qty 2

## 2020-03-19 MED ORDER — POTASSIUM CHLORIDE 10 MEQ/100ML IV SOLN
10.0000 meq | Freq: Once | INTRAVENOUS | Status: AC
  Administered 2020-03-19: 10 meq via INTRAVENOUS
  Filled 2020-03-19: qty 100

## 2020-03-19 MED ORDER — SODIUM CHLORIDE 0.9% FLUSH
3.0000 mL | Freq: Two times a day (BID) | INTRAVENOUS | Status: DC
  Administered 2020-03-19 – 2020-03-30 (×23): 3 mL via INTRAVENOUS

## 2020-03-19 NOTE — ED Triage Notes (Signed)
Pt via Ems . Here with SOB. Woke up with SOB in tripod position when ems arrived. O2 saturation RA 80% and Etco2 18. Pt placed on cpap machine o2 saturation 94 now.  Blood sugar for ems 496. NSR -ST on monitor

## 2020-03-19 NOTE — H&P (Signed)
Name: Sheri Ryan MRN: [id number] DOB: 08-Jan-1930     CONSULTATION DATE: 03/19/2020  REFERRING MD :  Corky Downs  CHIEF COMPLAINT:SOB  STUDIES:    The CXR was Independently Reviewed By Me Today CXR reviewed-b/l infiltrates     HISTORY OF PRESENT ILLNESS:  87 yo AAF presents to ER via EMS for + SOB.  Woke up with SOB in tripod position when EMS arrived.  O2 saturation RA 70%Patient  placed on BiPAP Blood sugar for ems 496. NSR -ST on monitor Patient with elevated LA and AG, with elevated FSBS  Has h/o DM and CKD    ER Course IV abx BiPAP Insulin infusion COVID NEG PCCM asked to admit  PAST MEDICAL HISTORY :   has a past medical history of Blind left eye, Cataract, Chronic kidney disease, Diabetes mellitus without complication ([place]), Hypertension, and Retinopathy.  has a past surgical history that includes Abdominal surgery and Cataract extraction, bilateral (Bilateral, 2019). Prior to Admission medications   Medication Sig Start Date End Date Taking? Authorizing Provider  amLODipine (NORVASC) 10 MG tablet TAKE 1 TABLET(10 MG) BY MOUTH DAILY 10/14/19   Glean Hess, MD  augmented betamethasone dipropionate (DIPROLENE-AF) 0.05 % ointment Apply 1 application topically 2 (two) times daily. To lesions on leg 07/31/19   Glean Hess, MD  BYSTOLIC 10 MG tablet Take 1 tablet (10 mg total) by mouth 2 (two) times daily. 08/07/19   Glean Hess, MD  glipiZIDE (GLUCOTROL XL) 10 MG 24 hr tablet TAKE 1 TABLET(10 MG) BY MOUTH DAILY WITH BREAKFAST 12/11/19   Glean Hess, MD  lisinopril (ZESTRIL) 5 MG tablet TAKE 1 TABLET BY MOUTH EVERY DAY 10/14/19   Glean Hess, MD  sitaGLIPtin (JANUVIA) 50 MG tablet Take 1 tablet (50 mg total) by mouth daily. 08/02/19   Glean Hess, MD   No Known Allergies  FAMILY HISTORY:  family history includes Diabetes in her brother; Hypertension in her mother. SOCIAL HISTORY:  reports that she quit smoking about 47  years ago. Her smoking use included cigarettes. She has a 9.00 pack-year smoking history. She has never used smokeless tobacco. She reports previous alcohol use. She reports that she does not use drugs.  REVIEW OF SYSTEMS:   Unable to obtain due to critical illness      Estimated body mass index is 27.15 kg/m as calculated from the following:   Height as of this encounter: 5\' 5"  (1.651 m).   Weight as of this encounter: 74 kg.    VITAL SIGNS: Temp:  [98.2 F (36.8 C)] 98.2 F (36.8 C) (03/05 0740) Pulse Rate:  [86-98] 86 (03/05 0900) Resp:  [27-35] 27 (03/05 0900) BP: (143-156)/(57-73) 156/73 (03/05 0900) SpO2:  [90 %-94 %] 91 % (03/05 0900) Weight:  [74 kg] 74 kg (03/05 0741)   No intake/output data recorded. No intake/output data recorded.   SpO2: 91 %   Physical Examination:  GENERAL:critically ill appearing, +resp distress HEAD: Normocephalic, atraumatic.  EYES: Pupils equal, round, reactive to light.  No scleral icterus.  MOUTH: Moist mucosal membrane. NECK: Supple. No JVD.  PULMONARY: +rhonchi, +wheezing CARDIOVASCULAR: S1 and S2. Regular rate and rhythm. No murmurs, rubs, or gallops.  GASTROINTESTINAL: Soft, nontender, -distended.  Positive bowel sounds.  MUSCULOSKELETAL: No swelling, clubbing, or edema.  NEUROLOGIC: alert and awake SKIN:intact,warm,dry    MEDICATIONS: I have reviewed all medications and confirmed regimen as documented   CULTURE RESULTS   Recent Results (from the past 240 hour(s))  Resp Panel by RT-PCR (Flu A&B, Covid) Nasopharyngeal Swab     Status: None   Collection Time: 03/19/20  8:14 AM   Specimen: Nasopharyngeal Swab; Nasopharyngeal(NP) swabs in vial transport medium  Result Value Ref Range Status   SARS Coronavirus 2 by RT PCR NEGATIVE NEGATIVE Final    Comment: (NOTE) SARS-CoV-2 target nucleic acids are NOT DETECTED.  The SARS-CoV-2 RNA is generally detectable in upper respiratory specimens during the acute phase of  infection. The lowest concentration of SARS-CoV-2 viral copies this assay can detect is 138 copies/mL. A negative result does not preclude SARS-Cov-2 infection and should not be used as the sole basis for treatment or other patient management decisions. A negative result may occur with  improper specimen collection/handling, submission of specimen other than nasopharyngeal swab, presence of viral mutation(s) within the areas targeted by this assay, and inadequate number of viral copies(<138 copies/mL). A negative result must be combined with clinical observations, patient history, and epidemiological information. The expected result is Negative.  Fact Sheet for Patients:  EntrepreneurPulse.com.au  Fact Sheet for Healthcare Providers:  IncredibleEmployment.be  This test is no t yet approved or cleared by the [place] FDA and  has been authorized for detection and/or diagnosis of SARS-CoV-2 by FDA under an Emergency Use Authorization (EUA). This EUA will remain  in effect (meaning this test can be used) for the duration of the COVID-19 declaration under Section 564(b)(1) of the Act, 21 U.S.C.section 360bbb-3(b)(1), unless the authorization is terminated  or revoked sooner.       Influenza A by PCR NEGATIVE NEGATIVE Final   Influenza B by PCR NEGATIVE NEGATIVE Final    Comment: (NOTE) The Xpert Xpress SARS-CoV-2/FLU/RSV plus assay is intended as an aid in the diagnosis of influenza from Nasopharyngeal swab specimens and should not be used as a sole basis for treatment. Nasal washings and aspirates are unacceptable for Xpert Xpress SARS-CoV-2/FLU/RSV testing.  Fact Sheet for Patients: EntrepreneurPulse.com.au  Fact Sheet for Healthcare Providers: IncredibleEmployment.be  This test is not yet approved or cleared by the [place] FDA and has been authorized for detection and/or diagnosis of SARS-CoV-2  by FDA under an Emergency Use Authorization (EUA). This EUA will remain in effect (meaning this test can be used) for the duration of the COVID-19 declaration under Section 564(b)(1) of the Act, 21 U.S.C. section 360bbb-3(b)(1), unless the authorization is terminated or revoked.  Performed at [facility], [address], [place], [place] [address]           IMAGING    DG Chest Port 1 View  Result Date: 03/19/2020 CLINICAL DATA:  Shortness of breath. EXAM: PORTABLE CHEST 1 VIEW COMPARISON:  February 17, 2016 FINDINGS: No pneumothorax. Stable cardiomegaly. The hila and mediastinum are unchanged. No nodules or masses. Bilateral patchy pulmonary infiltrates, right greater than left. No other acute abnormalities. IMPRESSION: New bilateral patchy pulmonary infiltrates, right greater than left could represent asymmetric edema versus developing multifocal pneumonia. Recommend clinical correlation and attention on follow-up. Electronically Signed   By: Dorise Bullion III M.D   On: 03/19/2020 08:17     Nutrition Status:           Indwelling Urinary Catheter continued, requirement due to   Reason to continue Indwelling Urinary Catheter strict Intake/Output monitoring for hemodynamic instability      ASSESSMENT AND PLAN SYNOPSIS 87 yo AAF with Severe ACUTE Hypoxic and Hypercapnic Respiratory Failure due to bilateral infiltrates with acute bacterial/viral pneumonia with findings of acute S  CHF exacerbation with acute on chronic renal failure with acidosis and DKA  High risk for intubation Wean biPAP -start Bronchodilator Therapy -Wean Fio2 and PEEP as tolerated   ACUTE SYSTOLIC CARDIAC FAILURE-  Check ECHO Morphine as needed -oxygen as needed -Lasix as tolerated -follow up cardiac enzymes as indicated -follow up cardiology recs    ACUTE KIDNEY INJURY/Renal Failure -continue Foley Catheter-assess need -Avoid nephrotoxic agents -Follow urine output,  BMP -Ensure adequate renal perfusion, optimize oxygenation -Renal dose medications   SEPSIS present on admission -use vasopressors to keep MAP>65 if needed -follow ABG and LA -follow up cultures -emperic ABX  CARDIAC ICU monitoring  ID -continue IV abx as prescibed -follow up cultures  GI GI PROPHYLAXIS as indicated  NUTRITIONAL STATUS DIET-->NPO Constipation protocol as indicated   ENDO - will use ICU hypoglycemic\Hyperglycemia protocol if needed DKA-start insulin drip   ELECTROLYTES -follow labs as needed -replace as needed -pharmacy consultation and following    DVT/GI PRX ordered and assessed TRANSFUSIONS AS NEEDED MONITOR FSBS I Assessed the need for Labs I Assessed the need for Foley I Assessed the need for Central Venous Line Family Discussion when available I Assessed the need for Mobilization I made an Assessment of medications to be adjusted accordingly Safety Risk assessment Completed  CASE DISCUSSED IN MULTIDISCIPLINARY ROUNDS WITH ICU TEAM   Critical Care Time devoted to patient care services described in this note is 75 minutes.   Overall, patient is critically ill, prognosis is guarded.  Patient with Multiorgan failure and at high risk for cardiac arrest and death.    Corrin Parker, M.D.  [facility]  Medical Director [place] Director [facility] Cardio-Pulmonary Department

## 2020-03-19 NOTE — Progress Notes (Signed)
[facility] is monitoring the sepsis protocol.

## 2020-03-19 NOTE — Plan of Care (Signed)
  Problem: Clinical Measurements: Goal: Will remain free from infection Outcome: Not Progressing Goal: Diagnostic test results will improve Outcome: Not Progressing Goal: Respiratory complications will improve Outcome: Not Progressing Goal: Cardiovascular complication will be avoided Outcome: Not Progressing   Problem: Nutrition: Goal: Adequate nutrition will be maintained Outcome: Not Progressing

## 2020-03-19 NOTE — Progress Notes (Signed)
PHARMACY -  BRIEF ANTIBIOTIC NOTE   Pharmacy has received consult(s) for vancomycin and cefepime from an ED provider.  The patient's profile has been reviewed for ht/wt/allergies/indication/available labs.    One time order(s) placed for   1) cefepime 2 grams IV x 1  2) vancomycin 1250 mg IV x 1  Further antibiotics/pharmacy consults should be ordered by admitting physician if indicated.                       Thank you, [name] 03/19/2020  9:33 AM

## 2020-03-19 NOTE — Progress Notes (Signed)
Pharmacy Antibiotic Note  Sheri Ryan is a 87 y.o. female w/ PMH of blindness in her left eye, CKD, HTN, DM admitted on 03/19/2020 with pneumonia and CHF exacerbation with acute on chronic kidney disease. No record of CHF or COPD in her medical records. Pharmacy has been consulted for vancomycin and cefepime dosing.  Plan:  1) start vancomycin 750 mg IV Q 48 hrs following 1250 mg loading dose  Goal AUC 400-550  Expected AUC: 478.1  SCr used: 1.72 mg/dL  Daily renal function assessment while on IV vancomycin  Levels as clinically indicated  2) start cefepime 2 grams IV every 24 hours   Height: 5\' 5"  (165.1 cm) Weight: 74 kg (163 lb 2.3 oz) IBW/kg (Calculated) : 57  Temp (24hrs), Avg:98.2 F (36.8 C), Min:98.2 F (36.8 C), Max:98.2 F (36.8 C)  Recent Labs  Lab 03/19/20 0814  WBC 15.6*  CREATININE 1.72*  LATICACIDVEN 2.9*    Estimated Creatinine Clearance: 22.8 mL/min (A) (by C-G formula based on SCr of 1.72 mg/dL (H)).    No Known Allergies  Antimicrobials this admission: 3/5 vancomycin >>  3/5 cefepime >>  Microbiology results: 03/05 BCx: pending 03/05 MRSA PCR: negative 03/05 SARS CoV-2: negative 03/05 influenza A/B: negative  Thank you for allowing pharmacy to be a part of this patient's care.  Sheri Ryan 03/19/2020 10:36 AM

## 2020-03-19 NOTE — ED Notes (Signed)
Report given to ICU RN Deanna

## 2020-03-19 NOTE — Progress Notes (Signed)
GOALS OF CARE DISCUSSION  The Clinical status was relayed to family in detail. Patient, and Grandson, Husband Updated and notified of patients medical condition.   Patient with slight increased WOB and using accessory muscles to breathe on biPAP, she is alert and awake and following commands Explained to family course of therapy and the modalities    I have addressed GOC and wishes, at this time patient does NOT want to be placed on Life support, she des NOT want to be intubated.  I addressed CPR as well and she wants to talk with family about that and think it over.    Patient will be made Limited CODE  Family understands the situation.  Family are satisfied with Plan of action and management. All questions answered  Additional CC time 32 mins   Kurian Patricia Pesa, M.D.  [facility]  Medical Director [place] Director [facility] Cardio-Pulmonary Department

## 2020-03-19 NOTE — Progress Notes (Signed)
CODE SEPSIS - PHARMACY COMMUNICATION  **Broad Spectrum Antibiotics should be administered within 1 hour of Sepsis diagnosis**  Time Code Sepsis Called/Page Received: 0926  Antibiotics Ordered: azithromycin, cefepime & vancomycin  Time of 1st antibiotic administration: 1019  Additional action taken by pharmacy: none required  If necessary, Name of Provider/Nurse Contacted: N/A    [name] ,PharmD Clinical Pharmacist  03/19/2020  10:28 AM
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
## 2020-03-20 ENCOUNTER — Other Ambulatory Visit: Payer: Self-pay

## 2020-03-20 ENCOUNTER — Inpatient Hospital Stay (HOSPITAL_COMMUNITY)
Admit: 2020-03-20 | Discharge: 2020-03-20 | Disposition: A | Discharge status: Home / Self Care | Payer: Medicare Other | Attending: Internal Medicine | Admitting: Internal Medicine

## 2020-03-20 ENCOUNTER — Inpatient Hospital Stay: Payer: Medicare Other

## 2020-03-20 DIAGNOSIS — R0603 Acute respiratory distress: Secondary | ICD-10-CM | POA: Diagnosis not present

## 2020-03-20 DIAGNOSIS — J9601 Acute respiratory failure with hypoxia: Secondary | ICD-10-CM | POA: Diagnosis not present

## 2020-03-20 LAB — CBC
HCT: 25.6 % — ABNORMAL LOW (ref 36.0–46.0)
Hemoglobin: 8.2 g/dL — ABNORMAL LOW (ref 12.0–15.0)
MCH: 26.1 pg (ref 26.0–34.0)
MCHC: 32 g/dL (ref 30.0–36.0)
MCV: 81.5 fL (ref 80.0–100.0)
Platelets: 332 10*3/uL (ref 150–400)
RBC: 3.14 MIL/uL — ABNORMAL LOW (ref 3.87–5.11)
RDW: 15.9 % — ABNORMAL HIGH (ref 11.5–15.5)
WBC: 16.5 10*3/uL — ABNORMAL HIGH (ref 4.0–10.5)
nRBC: 0 % (ref 0.0–0.2)

## 2020-03-20 LAB — BASIC METABOLIC PANEL
Anion gap: 12 (ref 5–15)
BUN: 30 mg/dL — ABNORMAL HIGH (ref 8–23)
CO2: 19 mmol/L — ABNORMAL LOW (ref 22–32)
Calcium: 8.7 mg/dL — ABNORMAL LOW (ref 8.9–10.3)
Chloride: 109 mmol/L (ref 98–111)
Creatinine, Ser: 1.68 mg/dL — ABNORMAL HIGH (ref 0.44–1.00)
GFR, Estimated: 29 mL/min — ABNORMAL LOW (ref >60)
Glucose, Bld: 123 mg/dL — ABNORMAL HIGH (ref 70–99)
Potassium: 4.6 mmol/L (ref 3.5–5.1)
Sodium: 140 mmol/L (ref 135–145)

## 2020-03-20 LAB — GLUCOSE, CAPILLARY
Glucose-Capillary: 105 mg/dL — ABNORMAL HIGH (ref 70–99)
Glucose-Capillary: 105 mg/dL — ABNORMAL HIGH (ref 70–99)
Glucose-Capillary: 120 mg/dL — ABNORMAL HIGH (ref 70–99)
Glucose-Capillary: 145 mg/dL — ABNORMAL HIGH (ref 70–99)
Glucose-Capillary: 146 mg/dL — ABNORMAL HIGH (ref 70–99)
Glucose-Capillary: 162 mg/dL — ABNORMAL HIGH (ref 70–99)

## 2020-03-20 LAB — MAGNESIUM: Magnesium: 2.1 mg/dL (ref 1.7–2.4)

## 2020-03-20 LAB — PHOSPHORUS: Phosphorus: 4.7 mg/dL — ABNORMAL HIGH (ref 2.5–4.6)

## 2020-03-20 MED ORDER — MIDAZOLAM HCL 2 MG/2ML IJ SOLN
2.0000 mg | Freq: Once | INTRAMUSCULAR | Status: AC
  Administered 2020-03-20: 2 mg via INTRAVENOUS
  Filled 2020-03-20: qty 2

## 2020-03-20 MED ORDER — MIDAZOLAM HCL 2 MG/2ML IJ SOLN
INTRAMUSCULAR | Status: AC
  Filled 2020-03-20: qty 2

## 2020-03-20 MED ORDER — MORPHINE SULFATE (PF) 2 MG/ML IV SOLN
2.0000 mg | INTRAVENOUS | Status: DC | PRN
  Administered 2020-03-22: 2 mg via INTRAVENOUS
  Filled 2020-03-20: qty 1

## 2020-03-20 MED ORDER — HYDRALAZINE HCL 20 MG/ML IJ SOLN: 10.0000 mg | INTRAMUSCULAR | Status: DC | PRN

## 2020-03-20 MED ORDER — FENTANYL CITRATE (PF) 100 MCG/2ML IJ SOLN: 50.0000 ug | INTRAMUSCULAR | Status: DC | PRN

## 2020-03-20 MED ORDER — MORPHINE SULFATE (PF) 2 MG/ML IV SOLN
2.0000 mg | INTRAVENOUS | Status: DC | PRN
  Administered 2020-03-20 (×2): 2 mg via INTRAVENOUS
  Filled 2020-03-20: qty 1

## 2020-03-20 MED ORDER — MIDAZOLAM HCL 2 MG/2ML IJ SOLN
2.0000 mg | Freq: Once | INTRAMUSCULAR | Status: AC
  Administered 2020-03-20: 2 mg via INTRAVENOUS

## 2020-03-20 MED ORDER — MORPHINE SULFATE (PF) 2 MG/ML IV SOLN
INTRAVENOUS | Status: AC
  Filled 2020-03-20: qty 1

## 2020-03-20 MED ORDER — MIDAZOLAM HCL 2 MG/2ML IJ SOLN: 2.0000 mg | INTRAMUSCULAR | Status: DC | PRN

## 2020-03-20 MED ORDER — FENTANYL CITRATE (PF) 100 MCG/2ML IJ SOLN
50.0000 ug | Freq: Once | INTRAMUSCULAR | Status: AC
Start: 2020-03-20 — End: 2020-03-20
  Administered 2020-03-20: 50 ug via INTRAVENOUS
  Filled 2020-03-20: qty 2

## 2020-03-20 MED ORDER — FENTANYL CITRATE (PF) 100 MCG/2ML IJ SOLN
50.0000 ug | Freq: Once | INTRAMUSCULAR | Status: AC
Start: 2020-03-20 — End: 2020-03-20
  Administered 2020-03-20: 50 ug via INTRAVENOUS

## 2020-03-20 MED ORDER — FUROSEMIDE 10 MG/ML IJ SOLN
80.0000 mg | Freq: Once | INTRAMUSCULAR | Status: AC
  Administered 2020-03-20: 80 mg via INTRAVENOUS
  Filled 2020-03-20: qty 8

## 2020-03-20 MED ORDER — ORAL CARE MOUTH RINSE
15.0000 mL | Freq: Two times a day (BID) | OROMUCOSAL | Status: DC
  Administered 2020-03-20 – 2020-03-29 (×17): 15 mL via OROMUCOSAL

## 2020-03-20 MED ORDER — FENTANYL CITRATE (PF) 100 MCG/2ML IJ SOLN
INTRAMUSCULAR | Status: AC
  Filled 2020-03-20: qty 2

## 2020-03-20 NOTE — Progress Notes (Addendum)
Inpatient Diabetes Program Recommendations  AACE/ADA: New Consensus Statement on Inpatient Glycemic Control (2015)  Target Ranges:  Prepandial:   less than 140 mg/dL      Peak postprandial:   less than 180 mg/dL (1-2 hours)      Critically ill patients:  140 - 180 mg/dL   Results for Sheri Ryan, Sheri Ryan (MRN [id number]) as of 03/20/2020 11:03  Ref. Range 03/19/2020 08:14  Sodium Latest Ref Range: 135 - 145 mmol/L 137  Potassium Latest Ref Range: 3.5 - 5.1 mmol/L 4.0  Chloride Latest Ref Range: 98 - 111 mmol/L 105  CO2 Latest Ref Range: 22 - 32 mmol/L 15 (L)  Glucose Latest Ref Range: 70 - 99 mg/dL 375 (H)  BUN Latest Ref Range: 8 - 23 mg/dL 27 (H)  Creatinine Latest Ref Range: 0.44 - 1.00 mg/dL 1.72 (H)  Calcium Latest Ref Range: 8.9 - 10.3 mg/dL 8.3 (L)  Anion gap Latest Ref Range: 5 - 15  17 (H)   Results for Sheri Ryan, Sheri Ryan (MRN [id number]) as of 03/20/2020 11:03  Ref. Range 07/31/2019 14:27  Hemoglobin A1C Latest Ref Range: 4.8 - 5.6 % 10.6 (H)  (258 mg/dl)   Results for Sheri Ryan, Sheri Ryan (MRN [id number]) as of 03/20/2020 11:03  Ref. Range 03/19/2020 14:15 03/19/2020 15:06 03/19/2020 16:21 03/19/2020 17:32 03/19/2020 18:36 03/19/2020 19:31 03/19/2020 20:26 03/19/2020 21:25  Glucose-Capillary Latest Ref Range: 70 - 99 mg/dL 247 (H)  IV Insulin Drip 206 (H)  IV Insulin Drip 159 (H)  IV Insulin Drip 153 (H)  IV Insulin Drip 149 (H)  IV Insulin Drip 142 (H)  IV Insulin Drip  10 units LANTUS 134 (H)  IV Insulin Drip 140 (H)  IV Insulin Drip Stopped   Results for Sheri Ryan, Sheri Ryan (MRN [id number]) as of 03/20/2020 11:03  Ref. Range 03/19/2020 23:29 03/20/2020 03:44 03/20/2020 07:37  Glucose-Capillary Latest Ref Range: 70 - 99 mg/dL 132 (H)  3 units NOVOLOG 105 (H) 105 (H)    Admit with: DKA/ Pneumonia and CHF exacerbation with acute on chronic kidney disease  History: DM, CKD  Home DM Meds: Glipizide 10 mg Daily       Januvia 50 mg Daily (NOT  taking)  Current Orders: Lantus 10 units QHS      Novolog Resistant Correction Scale/ SSI (0-20 units) Q4 hours     Requiring Bipap  Transitioned off the IV Insulin Drip last night--CBGs well controlled so far on Lantus and Novolog SSI   MD- May want to recheck Current A1c level.  Last one on file was 7.7% back on 11/25/2019 (see Office visit notes from 01/29/2020)    --Will follow patient during hospitalization--  Wyn Quaker RN, MSN, CDE Diabetes Coordinator Inpatient Glycemic Control Team Team Pager: [phone] (8a-5p)

## 2020-03-20 NOTE — Progress Notes (Signed)
CRITICAL CARE NOTE 87 yo AAF presents to ER via EMS for + SOB.  Woke up with SOB in tripod position when EMS arrived.  O2 saturation RA 70%Patient  placed on BiPAP Blood sugar for ems 496. NSR -ST on monitor Patient with elevated LA and AG, with elevated FSBS  Has h/o DM and CKD    ER Course IV abx BiPAP Insulin infusion COVID NEG PCCM asked to admit  3/5 admitted for resp failure, bIPAP/high flow [place] 3/6 severe resp distress back on biPAP  CC  follow up respiratory failure  SUBJECTIVE Patient remains critically ill Prognosis is guarded Back on biPAP Increased WOB Patient is DNI  FiO2 (%):  [40 %-55 %] 50 %  CBC    Component Value Date/Time   WBC 16.5 (H) 03/20/2020 0500   RBC 3.14 (L) 03/20/2020 0500   HGB 8.2 (L) 03/20/2020 0500   HGB 10.9 (L) 02/24/2018 1159   HCT 25.6 (L) 03/20/2020 0500   HCT 34.9 02/24/2018 1159   PLT 332 03/20/2020 0500   PLT 279 02/24/2018 1159   MCV 81.5 03/20/2020 0500   MCV 83 02/24/2018 1159   MCH 26.1 03/20/2020 0500   MCHC 32.0 03/20/2020 0500   RDW 15.9 (H) 03/20/2020 0500   RDW 14.4 02/24/2018 1159   LYMPHSABS 2.6 02/24/2018 1159   EOSABS 0.2 02/24/2018 1159   BASOSABS 0.1 02/24/2018 1159   BMP Latest Ref Rng & Units 03/20/2020 03/19/2020 03/19/2020  Glucose 70 - 99 mg/dL 123(H) 182(H) 375(H)  BUN 8 - 23 mg/dL 30(H) 31(H) 27(H)  Creatinine 0.44 - 1.00 mg/dL 1.68(H) 1.93(H) 1.72(H)  BUN/Creat Ratio 12 - 28 - - -  Sodium 135 - 145 mmol/L 140 137 137  Potassium 3.5 - 5.1 mmol/L 4.6 4.2 4.0  Chloride 98 - 111 mmol/L 109 107 105  CO2 22 - 32 mmol/L 19(L) 17(L) 15(L)  Calcium 8.9 - 10.3 mg/dL 8.7(L) 8.4(L) 8.3(L)    BP (!) 161/71   Pulse 86   Temp 97.8 F (36.6 C) (Axillary)   Resp 19   Ht 5\' 5"  (1.651 m)   Wt 75 kg   SpO2 95%   BMI 27.51 kg/m    I/O last 3 completed shifts: In: 1282.9 [I.V.:1282.9] Out: 500 [Urine:500] No intake/output data recorded.  SpO2: 95 % O2 Flow Rate (L/min): 45 L/min FiO2 (%): 50  %  Estimated body mass index is 27.51 kg/m as calculated from the following:   Height as of this encounter: 5\' 5"  (1.651 m).   Weight as of this encounter: 75 kg.  SIGNIFICANT EVENTS   REVIEW OF SYSTEMS  PATIENT IS UNABLE TO PROVIDE COMPLETE REVIEW OF SYSTEMS DUE TO SEVERE CRITICAL ILLNESS        PHYSICAL EXAMINATION:  GENERAL:critically ill appearing, +resp distress EYES: Pupils equal, round, reactive to light.  No scleral icterus.  MOUTH: Moist mucosal membrane. NECK: Supple.  PULMONARY: +rhonchi, +wheezing CARDIOVASCULAR: S1 and S2. Regular rate and rhythm. No murmurs, rubs, or gallops.  GASTROINTESTINAL: Soft, nontender, -distended.  Positive bowel sounds.   MUSCULOSKELETAL: No swelling, clubbing, or edema.  NEUROLOGIC: awake SKIN:intact,warm,dry  MEDICATIONS: I have reviewed all medications and confirmed regimen as documented   CULTURE RESULTS   Recent Results (from the past 240 hour(s))  Resp Panel by RT-PCR (Flu A&B, Covid) Nasopharyngeal Swab     Status: None   Collection Time: 03/19/20  8:14 AM   Specimen: Nasopharyngeal Swab; Nasopharyngeal(NP) swabs in vial transport medium  Result Value Ref Range Status  SARS Coronavirus 2 by RT PCR NEGATIVE NEGATIVE Final    Comment: (NOTE) SARS-CoV-2 target nucleic acids are NOT DETECTED.  The SARS-CoV-2 RNA is generally detectable in upper respiratory specimens during the acute phase of infection. The lowest concentration of SARS-CoV-2 viral copies this assay can detect is 138 copies/mL. A negative result does not preclude SARS-Cov-2 infection and should not be used as the sole basis for treatment or other patient management decisions. A negative result may occur with  improper specimen collection/handling, submission of specimen other than nasopharyngeal swab, presence of viral mutation(s) within the areas targeted by this assay, and inadequate number of viral copies(<138 copies/mL). A negative result must be  combined with clinical observations, patient history, and epidemiological information. The expected result is Negative.  Fact Sheet for Patients:  EntrepreneurPulse.com.au  Fact Sheet for Healthcare Providers:  IncredibleEmployment.be  This test is no t yet approved or cleared by the [place] FDA and  has been authorized for detection and/or diagnosis of SARS-CoV-2 by FDA under an Emergency Use Authorization (EUA). This EUA will remain  in effect (meaning this test can be used) for the duration of the COVID-19 declaration under Section 564(b)(1) of the Act, 21 U.S.C.section 360bbb-3(b)(1), unless the authorization is terminated  or revoked sooner.       Influenza A by PCR NEGATIVE NEGATIVE Final   Influenza B by PCR NEGATIVE NEGATIVE Final    Comment: (NOTE) The Xpert Xpress SARS-CoV-2/FLU/RSV plus assay is intended as an aid in the diagnosis of influenza from Nasopharyngeal swab specimens and should not be used as a sole basis for treatment. Nasal washings and aspirates are unacceptable for Xpert Xpress SARS-CoV-2/FLU/RSV testing.  Fact Sheet for Patients: EntrepreneurPulse.com.au  Fact Sheet for Healthcare Providers: IncredibleEmployment.be  This test is not yet approved or cleared by the [place] FDA and has been authorized for detection and/or diagnosis of SARS-CoV-2 by FDA under an Emergency Use Authorization (EUA). This EUA will remain in effect (meaning this test can be used) for the duration of the COVID-19 declaration under Section 564(b)(1) of the Act, 21 U.S.C. section 360bbb-3(b)(1), unless the authorization is terminated or revoked.  Performed at [facility], [address] [place], [place] [address]   MRSA PCR Screening     Status: None   Collection Time: 03/19/20 11:17 AM   Specimen: Nasopharyngeal  Result Value Ref Range Status   MRSA by PCR NEGATIVE NEGATIVE  Final    Comment:        The GeneXpert MRSA Assay (FDA approved for NASAL specimens only), is one component of a comprehensive MRSA colonization surveillance program. It is not intended to diagnose MRSA infection nor to guide or monitor treatment for MRSA infections. Performed at [facility], [address] [place], [place] [address]           IMAGING    DG Chest Port 1 View  Result Date: 03/20/2020 CLINICAL DATA:  Shortness of breath, history of diabetes, hypertension EXAM: PORTABLE CHEST 1 VIEW COMPARISON:  03/19/2020 FINDINGS: Increasing opacities in the mid to lower lungs including some more veiling opacity bilaterally which could reflect developing or enlarging layering effusions or worsening airspace disease. No pneumothorax. Stable cardiomediastinal contours when compared to prior. Calcified tortuous aorta. No acute osseous or soft tissue abnormality. Telemetry leads overlie the chest. IMPRESSION: Increasing opacities in the mid to lower lungs including some more veiling opacity bilaterally may reflect a combination developing effusions and worsening airspace disease either infection or edema. Stable  cardiomegaly. Aortic Atherosclerosis (ICD10-I70.0). Electronically Signed   By: Lovena Le M.D.   On: 03/20/2020 02:41   DG Chest Port 1 View  Result Date: 03/19/2020 CLINICAL DATA:  Shortness of breath. EXAM: PORTABLE CHEST 1 VIEW COMPARISON:  February 17, 2016 FINDINGS: No pneumothorax. Stable cardiomegaly. The hila and mediastinum are unchanged. No nodules or masses. Bilateral patchy pulmonary infiltrates, right greater than left. No other acute abnormalities. IMPRESSION: New bilateral patchy pulmonary infiltrates, right greater than left could represent asymmetric edema versus developing multifocal pneumonia. Recommend clinical correlation and attention on follow-up. Electronically Signed   By: Dorise Bullion III M.D   On: 03/19/2020 08:17     Nutrition Status:            Indwelling Urinary Catheter continued, requirement due to   Reason to continue Indwelling Urinary Catheter strict Intake/Output monitoring for hemodynamic instability      ASSESSMENT AND PLAN SYNOPSIS 87 yo AAF with Severe ACUTE Hypoxic and Hypercapnic Respiratory Failure due to bilateral infiltrates with acute bacterial/viral pneumonia with findings of acute S CHF exacerbation with acute on chronic renal failure with acidosis and DKA  Severe ACUTE Hypoxic and Hypercapnic Respiratory Failure -continue Bronchodilator Therapy -Wean Fio2 and PEEP as tolerated High risk for intubation  ACUTE SYSTOLIC CARDIAC FAILURE- EF -oxygen as needed -Lasix as tolerated   Morbid obesity, possible OSA.   Will certainly impact respiratory mechanics   ACUTE KIDNEY INJURY/Renal Failure -continue Foley Catheter-assess need -Avoid nephrotoxic agents -Follow urine output, BMP -Ensure adequate renal perfusion, optimize oxygenation -Renal dose medications     NEUROLOGY Toxic encephalopathy  From hypoxia   CARDIAC ICU monitoring  ID -continue IV abx as prescibed -follow up cultures  GI GI PROPHYLAXIS as indicated  NUTRITIONAL STATUS Nutrition Status:         DIET-->NPO Constipation protocol as indicated  ENDO - will use ICU hypoglycemic\Hyperglycemia protocol if indicated     ELECTROLYTES -follow labs as needed -replace as needed -pharmacy consultation and following   DVT/GI PRX ordered and assessed TRANSFUSIONS AS NEEDED MONITOR FSBS I Assessed the need for Labs I Assessed the need for Foley I Assessed the need for Central Venous Line Family Discussion when available I Assessed the need for Mobilization I made an Assessment of medications to be adjusted accordingly Safety Risk assessment completed   CASE DISCUSSED IN MULTIDISCIPLINARY ROUNDS WITH ICU TEAM  Critical Care Time devoted to patient care services described in this note is 58 minutes.    Overall, patient is critically ill, prognosis is guarded.  Patient with Multiorgan failure and at high risk for cardiac arrest and death.   Patient is Limited code DNI Corrin Parker, M.D.  [facility]  Medical Director [place] Director [facility] Cardio-Pulmonary Department

## 2020-03-20 NOTE — Progress Notes (Signed)
*  PRELIMINARY RESULTS* Echocardiogram 2D Echocardiogram has been performed.  Sheri Ryan Sheri Ryan 03/20/2020, 1:19 PM

## 2020-03-20 NOTE — Progress Notes (Signed)
Pharmacy Antibiotic Note  Sheri Ryan is a 87 y.o. female w/ PMH of blindness in her left eye, CKD, HTN, DM admitted on 03/19/2020 with pneumonia and CHF exacerbation with acute on chronic kidney disease. No record of CHF or COPD in her medical records. Pharmacy has been consulted for vancomycin and cefepime dosing.  Plan:  1) start vancomycin 750 mg IV Q 48 hrs following 1250 mg loading dose  Goal AUC 400-550  Expected AUC: 478.1  SCr used: 1.72 mg/dL  Daily renal function assessment while on IV vancomycin  Levels as clinically indicated 2) start cefepime 2 grams IV every 24 hours   3/6: Scr 1.68  Crcl 23.5 ml/min  MRSA PCR negative Continue to follow. Possible d/c of Vancomycin?    Height: 5\' 5"  (165.1 cm) Weight: 75 kg (165 lb 5.5 oz) IBW/kg (Calculated) : 57  Temp (24hrs), Avg:97.9 F (36.6 C), Min:97.5 F (36.4 C), Max:98.1 F (36.7 C)  Recent Labs  Lab 03/19/20 0814 03/19/20 1000 03/19/20 1609 03/20/20 0500  WBC 15.6*  --   --  16.5*  CREATININE 1.72*  --  1.93* 1.68*  LATICACIDVEN 2.9* 1.5  --   --     Estimated Creatinine Clearance: 23.5 mL/min (A) (by C-G formula based on SCr of 1.68 mg/dL (H)).    No Known Allergies  Antimicrobials this admission: 3/5 vancomycin >>  3/5 cefepime >>  Microbiology results: 03/05 BCx: pending 03/05 MRSA PCR: negative 03/05 SARS CoV-2: negative 03/05 influenza A/B: negative  Thank you for allowing pharmacy to be a part of this patient's care.  Kathe Wirick A 03/20/2020 8:38 AM

## 2020-03-21 ENCOUNTER — Inpatient Hospital Stay: Payer: Medicare Other

## 2020-03-21 DIAGNOSIS — J9601 Acute respiratory failure with hypoxia: Secondary | ICD-10-CM | POA: Diagnosis not present

## 2020-03-21 DIAGNOSIS — R778 Other specified abnormalities of plasma proteins: Secondary | ICD-10-CM

## 2020-03-21 DIAGNOSIS — I340 Nonrheumatic mitral (valve) insufficiency: Secondary | ICD-10-CM

## 2020-03-21 DIAGNOSIS — I5031 Acute diastolic (congestive) heart failure: Secondary | ICD-10-CM

## 2020-03-21 DIAGNOSIS — N183 Chronic kidney disease, stage 3 unspecified: Secondary | ICD-10-CM

## 2020-03-21 DIAGNOSIS — I272 Pulmonary hypertension, unspecified: Secondary | ICD-10-CM | POA: Diagnosis not present

## 2020-03-21 LAB — BRAIN NATRIURETIC PEPTIDE
B Natriuretic Peptide: 1699.8 pg/mL — ABNORMAL HIGH (ref 0.0–100.0)
B Natriuretic Peptide: 1544.3 pg/mL — ABNORMAL HIGH (ref 0.0–100.0)

## 2020-03-21 LAB — ECHOCARDIOGRAM COMPLETE
AR max vel: 1.42 cm2
AV Peak grad: 10.1 mmHg
Ao pk vel: 1.59 m/s
Area-P 1/2: 5.13 cm2
Height: 65 in
S' Lateral: 2.78 cm
Weight: 2645.52 oz

## 2020-03-21 LAB — BASIC METABOLIC PANEL
Anion gap: 9 (ref 5–15)
BUN: 31 mg/dL — ABNORMAL HIGH (ref 8–23)
CO2: 23 mmol/L (ref 22–32)
Calcium: 8.5 mg/dL — ABNORMAL LOW (ref 8.9–10.3)
Chloride: 109 mmol/L (ref 98–111)
Creatinine, Ser: 1.85 mg/dL — ABNORMAL HIGH (ref 0.44–1.00)
GFR, Estimated: 26 mL/min — ABNORMAL LOW (ref >60)
Glucose, Bld: 101 mg/dL — ABNORMAL HIGH (ref 70–99)
Potassium: 4 mmol/L (ref 3.5–5.1)
Sodium: 141 mmol/L (ref 135–145)

## 2020-03-21 LAB — PHOSPHORUS: Phosphorus: 4.4 mg/dL (ref 2.5–4.6)

## 2020-03-21 LAB — GLUCOSE, CAPILLARY
Glucose-Capillary: 94 mg/dL (ref 70–99)
Glucose-Capillary: 107 mg/dL — ABNORMAL HIGH (ref 70–99)
Glucose-Capillary: 119 mg/dL — ABNORMAL HIGH (ref 70–99)
Glucose-Capillary: 151 mg/dL — ABNORMAL HIGH (ref 70–99)
Glucose-Capillary: 115 mg/dL — ABNORMAL HIGH (ref 70–99)
Glucose-Capillary: 113 mg/dL — ABNORMAL HIGH (ref 70–99)

## 2020-03-21 LAB — CBC
HCT: 24.8 % — ABNORMAL LOW (ref 36.0–46.0)
Hemoglobin: 7.9 g/dL — ABNORMAL LOW (ref 12.0–15.0)
MCH: 25.4 pg — ABNORMAL LOW (ref 26.0–34.0)
MCHC: 31.9 g/dL (ref 30.0–36.0)
MCV: 79.7 fL — ABNORMAL LOW (ref 80.0–100.0)
Platelets: 360 10*3/uL (ref 150–400)
RBC: 3.11 MIL/uL — ABNORMAL LOW (ref 3.87–5.11)
RDW: 15.9 % — ABNORMAL HIGH (ref 11.5–15.5)
WBC: 15.3 10*3/uL — ABNORMAL HIGH (ref 4.0–10.5)
nRBC: 0 % (ref 0.0–0.2)

## 2020-03-21 LAB — TROPONIN I (HIGH SENSITIVITY)
Troponin I (High Sensitivity): 491 ng/L (ref <18)
Troponin I (High Sensitivity): 412 ng/L (ref <18)

## 2020-03-21 LAB — MAGNESIUM: Magnesium: 2 mg/dL (ref 1.7–2.4)

## 2020-03-21 LAB — PROCALCITONIN: Procalcitonin: 3.27 ng/mL

## 2020-03-21 MED ORDER — FUROSEMIDE 10 MG/ML IJ SOLN
40.0000 mg | Freq: Once | INTRAMUSCULAR | Status: AC
  Administered 2020-03-21: 40 mg via INTRAVENOUS
  Filled 2020-03-21: qty 4

## 2020-03-21 MED ORDER — NITROGLYCERIN IN D5W 200-5 MCG/ML-% IV SOLN
0.0000 ug/min | INTRAVENOUS | Status: DC
  Administered 2020-03-21 – 2020-03-22 (×2): 30 ug/min via INTRAVENOUS
  Filled 2020-03-21 (×2): qty 250
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
## 2020-03-21 NOTE — TOC Initial Note (Addendum)
Transition of Care [facility]) - Initial/Assessment Note    Patient Details  Name: Sheri Ryan Height MRN: [id number] Date of Birth: [birth year]  Transition of Care [facility]) CM/SW Contact:    Ova Freshwater Phone Number: [phone] 03/21/2020, 3:20 PM  Clinical Narrative:                  Patient arrives to [facility] due to SOB/respiratory failure. Patient is currently on bi-pap and unable to hear this CSW well.  Patient's Lawlor, Normal (Spouse) [phone] at bedside and provided collateral information. Additional contact Niobe Dick (son) [phone]. CSW explained the role of TOC in patient care.  Mr. Sorter stated the patient is able to perform most ADLs without assistance.  Mr. Bednarz stated sometimes the patient needs assistance with dressing, since her left arm often is weak. Mr. Poarch state the patient is continent and is able to shower on her using a shower chair. Mr. Shawley stated the patient has not been able to do as much in the home due to increased weakness.  Patient's PCP is Dr. Ola Spurr, pharmacy is [facility] in [place].  Patient is able to take medications everyday without any issues. Patient does not use O2 at home.  Mr. Kolek stated they prefer home health regardless of PT recommendations.  Expected Discharge Plan: [place] Barriers to Discharge: Continued Medical Work up   Patient Goals and CMS Choice Patient states their goals for this hospitalization and ongoing recovery are:: To return home and to get her strength back. CMS Medicare.gov Compare Post Acute Care list provided to:: Other (Comment Required) Choice offered to / list presented to : Spouse  Expected Discharge Plan and Services Expected Discharge Plan: [place] In-house Referral: Clinical Social Work   Post Acute Care Choice: [place] arrangements for the past 2 months: [place]                                       Prior Living Arrangements/Services Living arrangements for the past 2 months: [place] with:: Spouse Alexarae, Oliva (Spouse)   [phone]) Patient language and need for interpreter reviewed:: Yes Do you feel safe going back to the place where you live?: Yes      Need for Family Participation in Patient Care: Yes (Comment)     Criminal Activity/Legal Involvement Pertinent to Current Situation/Hospitalization: No - Comment as needed  Activities of Daily Living Home Assistive Devices/Equipment: Gilford Rile (specify type) ADL Screening (condition at time of admission) Patient's cognitive ability adequate to safely complete daily activities?: No Is the patient deaf or have difficulty hearing?: Yes Does the patient have difficulty seeing, even when wearing glasses/contacts?: Yes (Lt side blindness/Cataracts) Does the patient have difficulty concentrating, remembering, or making decisions?: No Patient able to express need for assistance with ADLs?: Yes Does the patient have difficulty dressing or bathing?: Yes Independently performs ADLs?: No (Bot at this time) Does the patient have difficulty walking or climbing stairs?: Yes Weakness of Legs: Both Weakness of Arms/Hands: None  Permission Sought/Granted Permission sought to share information with : Facility Sport and exercise psychologist    Share Information with NAME: Gola, Bribiesca (Spouse)   [phone]     Permission granted to share info w Relationship: Torii Royse (son) [phone]     Emotional Assessment Appearance:: Appears stated age Attitude/Demeanor/Rapport: Gracious Affect (typically observed): Stable Orientation: : Oriented to Self,Oriented  to Place,Oriented to Situation Alcohol / Substance Use: Not Applicable Psych Involvement: No (comment)  Admission diagnosis:  Respiratory failure ([place]) [J96.90] Acute pulmonary edema ([place]) [J81.0] Acute respiratory failure with hypoxia ([place]) [J96.01] Diabetic  ketoacidosis without coma associated with type 2 diabetes mellitus ([place]) [E11.10] Anemia, unspecified type [D64.9] Patient Active Problem List   Diagnosis Date Noted  . Respiratory failure ([place]) 03/19/2020  . Pain of finger joint 08/07/2019  . Gouty arthritis of right great toe 02/24/2018  . Blindness of left eye 09/27/2017  . Non-toxic multinodular goiter 09/27/2017  . Left shoulder pain 04/30/2017  . Neck pain 04/30/2017  . Cervical radiculopathy at C6 04/23/2017  . Cervical disc disorder at C5-C6 level with radiculopathy 04/15/2017  . Type II diabetes mellitus with complication ([place]) 95/63/8756  . Impaired functional mobility, balance, gait, and endurance 01/07/2015  . Hypertrophic lichen planus 43/32/9518  . Hyperlipidemia associated with type 2 diabetes mellitus ([place]) 06/27/2014  . Essential (primary) hypertension 06/27/2014  . Acid reflux 06/27/2014  . Arthritis of knee, degenerative 06/27/2014  . Hyperlipidemia 10/12/2011   PCP:  Glean Hess, MD Pharmacy:   [facility] [phone] [address] [place] [address] Phone: [phone] Fax: [phone]     Social Determinants of Health (SDOH) Interventions    Readmission Risk Interventions No flowsheet data found.

## 2020-03-21 NOTE — Progress Notes (Addendum)
1800 Patient states she feels better now since NTG drip. Output was poor after 40 mg of Lasix today. Bladder scaned for 450 ml of urine in bladder.

## 2020-03-21 NOTE — Progress Notes (Signed)
Follow up - Critical Care Medicine Note  Patient Details:    Sheri Ryan is an 87 y.o. female presents to ER via EMS for + SOB. Woke up with SOB in tripod position when EMS arrived. O2 saturation RA 70%Patient  placed on BiPAP.Blood sugar for EMS 496. NSR -ST on monitor. Patient with elevated LA and AG, with elevated FSBS.  PCCM asked to admit.   Lines, Airways, Drains: External Urinary Catheter (Active)  Collection Container Dedicated Suction Canister 03/20/20 0803  Suction (Verified suction is between 40-80 mmHg) Yes 03/20/20 0803  Site Assessment Clean;Intact;Dry 03/20/20 0803  Intervention Equipment Changed 03/20/20 1300  Output (mL) 200 mL 03/21/20 0534    Anti-infectives:  Anti-infectives (From admission, onward)   Start     Dose/Rate Route Frequency Ordered Stop   03/21/20 0000  vancomycin (VANCOREADY) IVPB 750 mg/150 mL  Status:  Discontinued        750 mg 150 mL/hr over 60 Minutes Intravenous Every 48 hours 03/19/20 1253 03/20/20 0847   03/20/20 1200  ceFEPIme (MAXIPIME) 2 g in sodium chloride 0.9 % 100 mL IVPB        2 g 200 mL/hr over 30 Minutes Intravenous Every 24 hours 03/19/20 1253     03/19/20 0945  vancomycin (VANCOREADY) IVPB 1250 mg/250 mL        1,250 mg 166.7 mL/hr over 90 Minutes Intravenous  Once 03/19/20 0935 03/20/20 0700   03/19/20 0930  vancomycin (VANCOCIN) IVPB 1000 mg/200 mL premix  Status:  Discontinued        1,000 mg 200 mL/hr over 60 Minutes Intravenous  Once 03/19/20 0926 03/19/20 0935   03/19/20 0930  ceFEPIme (MAXIPIME) 2 g in sodium chloride 0.9 % 100 mL IVPB        2 g 200 mL/hr over 30 Minutes Intravenous  Once 03/19/20 0926 03/20/20 0700   03/19/20 0930  azithromycin (ZITHROMAX) 500 mg in sodium chloride 0.9 % 250 mL IVPB        500 mg 250 mL/hr over 60 Minutes Intravenous  Once 03/19/20 0926 03/19/20 1120     Scheduled Meds: . budesonide (PULMICORT) nebulizer solution  0.5 mg Nebulization BID  . Chlorhexidine Gluconate  Cloth  6 each Topical Q0600  . heparin  5,000 Units Subcutaneous Q8H  . insulin aspart  0-20 Units Subcutaneous Q4H  . insulin glargine  10 Units Subcutaneous QHS  . ipratropium-albuterol  3 mL Nebulization Q4H  . mouth rinse  15 mL Mouth Rinse BID  . sodium chloride flush  3 mL Intravenous Q12H   Continuous Infusions: . sodium chloride Stopped (03/20/20 1603)  . ceFEPime (MAXIPIME) IV Stopped (03/20/20 1142)  . famotidine (PEPCID) IV Stopped (03/20/20 5409)  . nitroGLYCERIN     PRN Meds:.sodium chloride, acetaminophen, dextrose, docusate sodium, fentaNYL (SUBLIMAZE) injection, hydrALAZINE, midazolam, morphine injection, ondansetron (ZOFRAN) IV, polyethylene glycol, sodium chloride flush   Microbiology: Results for orders placed or performed during the hospital encounter of 03/19/20  Blood culture (routine x 2)     Status: None (Preliminary result)   Collection Time: 03/19/20  8:14 AM   Specimen: BLOOD  Result Value Ref Range Status   Specimen Description BLOOD RIGHT FOREARM  Final   Special Requests   Final    BOTTLES DRAWN AEROBIC AND ANAEROBIC Blood Culture results may not be optimal due to an excessive volume of blood received in culture bottles   Culture   Final    NO GROWTH 2 DAYS Performed at [facility]  Lab, [place], [place] [address]    Report Status PENDING  Incomplete  Blood culture (routine x 2)     Status: None (Preliminary result)   Collection Time: 03/19/20  8:14 AM   Specimen: BLOOD  Result Value Ref Range Status   Specimen Description BLOOD RIGHT HAND  Final   Special Requests   Final    BOTTLES DRAWN AEROBIC AND ANAEROBIC Blood Culture adequate volume   Culture   Final    NO GROWTH 2 DAYS Performed at [facility], [address] [place], [place] [address]    Report Status PENDING  Incomplete  Resp Panel by RT-PCR (Flu A&B, Covid) Nasopharyngeal Swab     Status: None   Collection Time: 03/19/20  8:14 AM   Specimen:  Nasopharyngeal Swab; Nasopharyngeal(NP) swabs in vial transport medium  Result Value Ref Range Status   SARS Coronavirus 2 by RT PCR NEGATIVE NEGATIVE Final    Comment: (NOTE) SARS-CoV-2 target nucleic acids are NOT DETECTED.  The SARS-CoV-2 RNA is generally detectable in upper respiratory specimens during the acute phase of infection. The lowest concentration of SARS-CoV-2 viral copies this assay can detect is 138 copies/mL. A negative result does not preclude SARS-Cov-2 infection and should not be used as the sole basis for treatment or other patient management decisions. A negative result may occur with  improper specimen collection/handling, submission of specimen other than nasopharyngeal swab, presence of viral mutation(s) within the areas targeted by this assay, and inadequate number of viral copies(<138 copies/mL). A negative result must be combined with clinical observations, patient history, and epidemiological information. The expected result is Negative.  Fact Sheet for Patients:  EntrepreneurPulse.com.au  Fact Sheet for Healthcare Providers:  IncredibleEmployment.be  This test is no t yet approved or cleared by the [place] FDA and  has been authorized for detection and/or diagnosis of SARS-CoV-2 by FDA under an Emergency Use Authorization (EUA). This EUA will remain  in effect (meaning this test can be used) for the duration of the COVID-19 declaration under Section 564(b)(1) of the Act, 21 U.S.C.section 360bbb-3(b)(1), unless the authorization is terminated  or revoked sooner.       Influenza A by PCR NEGATIVE NEGATIVE Final   Influenza B by PCR NEGATIVE NEGATIVE Final    Comment: (NOTE) The Xpert Xpress SARS-CoV-2/FLU/RSV plus assay is intended as an aid in the diagnosis of influenza from Nasopharyngeal swab specimens and should not be used as a sole basis for treatment. Nasal washings and aspirates are unacceptable for  Xpert Xpress SARS-CoV-2/FLU/RSV testing.  Fact Sheet for Patients: EntrepreneurPulse.com.au  Fact Sheet for Healthcare Providers: IncredibleEmployment.be  This test is not yet approved or cleared by the [place] FDA and has been authorized for detection and/or diagnosis of SARS-CoV-2 by FDA under an Emergency Use Authorization (EUA). This EUA will remain in effect (meaning this test can be used) for the duration of the COVID-19 declaration under Section 564(b)(1) of the Act, 21 U.S.C. section 360bbb-3(b)(1), unless the authorization is terminated or revoked.  Performed at [facility], [address] [place], [place] [address]   MRSA PCR Screening     Status: None   Collection Time: 03/19/20 11:17 AM   Specimen: Nasopharyngeal  Result Value Ref Range Status   MRSA by PCR NEGATIVE NEGATIVE Final    Comment:        The GeneXpert MRSA Assay (FDA approved for NASAL specimens only), is one component of a comprehensive MRSA colonization surveillance  program. It is not intended to diagnose MRSA infection nor to guide or monitor treatment for MRSA infections. Performed at [facility], [address], [place], [place] [address]     Best Practice/Protocols:  VTE Prophylaxis: Heparin (SQ) GI Prophylaxis: Antihistamine Hyperglycemia (ICU)  Events: 3/5 admitted for resp failure, BiPAP/high flow [place],Jehova's witness, no blood tx. Partial code: DNI 3/6 severe resp distress back on BiPAP 3/7 elevated BNP ,worsening pulm edema  Studies: DG Chest Port 1 View  Result Date: 03/21/2020 CLINICAL DATA:  Dyspnea, pulmonary edema EXAM: PORTABLE CHEST 1 VIEW COMPARISON:  Chest radiograph from one day prior. FINDINGS: Stable cardiomediastinal silhouette with top-normal heart size. No pneumothorax. Trace right pleural effusion, decreased. Small left pleural effusion, decreased. Extensive patchy opacity throughout both lungs,  predominantly parahilar, not substantially changed, noting overall improved lung volumes. IMPRESSION: 1. Decreased trace right and small left pleural effusions. 2. Stable extensive patchy opacity throughout both lungs, predominantly parahilar, differential includes multifocal pneumonia or persistent pulmonary edema. Electronically Signed   By: Ilona Sorrel M.D.   On: 03/21/2020 08:35   DG Chest Port 1 View  Result Date: 03/20/2020 CLINICAL DATA:  Shortness of breath, history of diabetes, hypertension EXAM: PORTABLE CHEST 1 VIEW COMPARISON:  03/19/2020 FINDINGS: Increasing opacities in the mid to lower lungs including some more veiling opacity bilaterally which could reflect developing or enlarging layering effusions or worsening airspace disease. No pneumothorax. Stable cardiomediastinal contours when compared to prior. Calcified tortuous aorta. No acute osseous or soft tissue abnormality. Telemetry leads overlie the chest. IMPRESSION: Increasing opacities in the mid to lower lungs including some more veiling opacity bilaterally may reflect a combination developing effusions and worsening airspace disease either infection or edema. Stable cardiomegaly. Aortic Atherosclerosis (ICD10-I70.0). Electronically Signed   By: Lovena Le M.D.   On: 03/20/2020 02:41   DG Chest Port 1 View  Result Date: 03/19/2020 CLINICAL DATA:  Shortness of breath. EXAM: PORTABLE CHEST 1 VIEW COMPARISON:  February 17, 2016 FINDINGS: No pneumothorax. Stable cardiomegaly. The hila and mediastinum are unchanged. No nodules or masses. Bilateral patchy pulmonary infiltrates, right greater than left. No other acute abnormalities. IMPRESSION: New bilateral patchy pulmonary infiltrates, right greater than left could represent asymmetric edema versus developing multifocal pneumonia. Recommend clinical correlation and attention on follow-up. Electronically Signed   By: Dorise Bullion III M.D   On: 03/19/2020 08:17   ECHOCARDIOGRAM  COMPLETE  Result Date: 03/21/2020    ECHOCARDIOGRAM REPORT   Patient Name:   RAYAAN LORAH Date of Exam: 03/20/2020 Medical Rec #:  [id number]                 Height:       65.0 in Accession #:    [id number]                Weight:       165.3 lb Date of Birth:  1929-06-09                 BSA:          1.824 m Patient Age:    70 years                  BP:           169/72 mmHg Patient Gender: F                         HR:  96 bpm. Exam Location:  [facility] Procedure: 2D Echo, Cardiac Doppler and Color Doppler Indications:     [place] respiratory distress R06.03  History:         Patient has no prior history of Echocardiogram examinations.                  Risk Factors:Hypertension and Diabetes.  Sonographer:     Alyse Low Roar Referring Phys:  [place] Diagnosing Phys: Kathlyn Sacramento MD IMPRESSIONS  1. Left ventricular ejection fraction, by estimation, is 55 to 60%. The left ventricle has normal function. Left ventricular endocardial border not optimally defined to evaluate regional wall motion. There is mild left ventricular hypertrophy. Left ventricular diastolic parameters are indeterminate.  2. Right ventricular systolic function is normal. The right ventricular size is normal. There is moderately elevated pulmonary artery systolic pressure. The estimated right ventricular systolic pressure is 16.1 mmHg.  3. Left atrial size was mildly dilated.  4. The mitral valve is normal in structure. Moderate mitral valve regurgitation. No evidence of mitral stenosis.  5. The aortic valve is normal in structure. Aortic valve regurgitation is not visualized. Mild to moderate aortic valve sclerosis/calcification is present, without any evidence of aortic stenosis. FINDINGS  Left Ventricle: Left ventricular ejection fraction, by estimation, is 55 to 60%. The left ventricle has normal function. Left ventricular endocardial border not optimally defined to evaluate regional wall motion. The left ventricular  internal cavity size was normal in size. There is mild left ventricular hypertrophy. Left ventricular diastolic parameters are indeterminate. Right Ventricle: The right ventricular size is normal. No increase in right ventricular wall thickness. Right ventricular systolic function is normal. There is moderately elevated pulmonary artery systolic pressure. The tricuspid regurgitant velocity is 3.30 m/s, and with an assumed right atrial pressure of 5 mmHg, the estimated right ventricular systolic pressure is 09.6 mmHg. Left Atrium: Left atrial size was mildly dilated. Right Atrium: Right atrial size was normal in size. Pericardium: There is no evidence of pericardial effusion. Mitral Valve: The mitral valve is normal in structure. There is mild calcification of the mitral valve leaflet(s). Moderate mitral valve regurgitation. No evidence of mitral valve stenosis. Tricuspid Valve: The tricuspid valve is normal in structure. Tricuspid valve regurgitation is mild . No evidence of tricuspid stenosis. Aortic Valve: The aortic valve is normal in structure. Aortic valve regurgitation is not visualized. Mild to moderate aortic valve sclerosis/calcification is present, without any evidence of aortic stenosis. Aortic valve peak gradient measures 10.1 mmHg. Pulmonic Valve: The pulmonic valve was normal in structure. Pulmonic valve regurgitation is mild. No evidence of pulmonic stenosis. Aorta: The aortic root is normal in size and structure. Venous: The inferior vena cava was not well visualized. IAS/Shunts: No atrial level shunt detected by color flow Doppler.  LEFT VENTRICLE PLAX 2D LVIDd:         3.81 cm  Diastology LVIDs:         2.78 cm  LV e' medial:    5.44 cm/s LV PW:         1.14 cm  LV E/e' medial:  22.8 LV IVS:        1.29 cm  LV e' lateral:   9.36 cm/s LVOT diam:     1.80 cm  LV E/e' lateral: 13.2 LVOT Area:     2.54 cm  RIGHT VENTRICLE RV Mid diam:    3.12 cm RV S prime:     14.90 cm/s TAPSE (M-mode): 1.9 cm LEFT  ATRIUM  Index       RIGHT ATRIUM           Index LA diam:        3.95 cm 2.17 cm/m  RA Area:     12.00 cm LA Vol (A2C):   69.9 ml 38.31 ml/m RA Volume:   26.70 ml  14.63 ml/m LA Vol (A4C):   48.6 ml 26.64 ml/m LA Biplane Vol: 59.9 ml 32.83 ml/m  AORTIC VALVE                PULMONIC VALVE AV Area (Vmax): 1.42 cm    PV Vmax:        0.90 m/s AV Vmax:        159.00 cm/s PV Peak grad:   3.2 mmHg AV Peak Grad:   10.1 mmHg   RVOT Peak grad: 3 mmHg LVOT Vmax:      88.50 cm/s  AORTA Ao Root diam: 2.70 cm MITRAL VALVE                TRICUSPID VALVE MV Area (PHT): 5.13 cm     TR Peak grad:   43.6 mmHg MV Decel Time: 148 msec     TR Vmax:        330.00 cm/s MV E velocity: 124.00 cm/s MV A velocity: 135.00 cm/s  SHUNTS MV E/A ratio:  0.92         Systemic Diam: 1.80 cm MV A Prime:    13.6 cm/s Kathlyn Sacramento MD Electronically signed by Kathlyn Sacramento MD Signature Date/Time: 03/21/2020/7:56:15 AM    Final     Consults: Cardiology  Subjective:    Overnight Issues: Ongoing issues with severe dyspnea on switch to high flow nasal cannula O2.  Requiring BiPAP.  Complaining of chest pain.  Objective:  Vital signs for last 24 hours: Temp:  [98.3 F (36.8 C)-99.6 F (37.6 C)] 98.7 F (37.1 C) (03/07 0721) Pulse Rate:  [30-107] 94 (03/07 0700) Resp:  [15-39] 23 (03/07 0700) BP: (136-175)/(61-118) 157/69 (03/07 0700) SpO2:  [80 %-100 %] 97 % (03/07 0721) FiO2 (%):  [50 %-70 %] 70 % (03/07 0721) Weight:  [73.8 kg] 73.8 kg (03/07 0500)  Hemodynamic parameters for last 24 hours:    Intake/Output from previous day: 03/06 0701 - 03/07 0700 In: 207 [I.V.:57; IV Piggyback:150] Out: 1250 [Urine:1250]  Intake/Output this shift: No intake/output data recorded.  Vent settings for last 24 hours: FiO2 (%):  [50 %-70 %] 70 %  Physical Exam:  GENERAL: Elderly woman, frail, tachypneic on high flow nasal cannula O2 states she cannot breathe well, anxious.   HEAD: Normocephalic, atraumatic.  EYES:  Pupils equal, round, reactive to light.  No scleral icterus.  MOUTH: Moist mucous membranes. NECK: Supple. No thyromegaly. Trachea midline. No JVD.  No adenopathy. PULMONARY: Good air entry bilaterally.  No adventitious sounds. CARDIOVASCULAR: S1 and S2. Regular rate and rhythm.  Positive S4. ABDOMEN: Soft, nondistended, nontender.  Normoactive bowel sounds MUSCULOSKELETAL: No joint deformity, no clubbing, no edema.  NEUROLOGIC: No focal deficit, she is fluent. SKIN: Intact,warm,dry. PSYCH: Anxious mood.  Assessment/Plan:   Acute respiratory failure with hypoxia CAP/pulmonary edema Continue antibiotics, cefepime Trend procalcitonin >> 5.85 Suspect diastolic dysfunction Initiate nitroglycerin infusion If if tolerates nitroglycerin, Lasix as tolerates Trend BNP Monitor I's and O's Currently has pure wick in place Check bladder scan  Pulmonary edema Mitral regurgitation Diastolic dysfunction/pulmonary hypertension Nitroglycerin infusion as above Diuretics as tolerates Have requested cardiology consult  Elevated troponins Query demand ischemia  Cycle troponins EKG for chest pain  Uncontrolled hypertension Nitroglycerin infusion as above   Diabetes mellitus type 2 Continue insulin both standing dose and sliding scale ICU/SDU hyperglycemia/hypoglycemia protocol  Acute renal failure Patient appears to be close to her baseline Baseline creatinine 1.5 Diuretics as tolerates  Anemia No evidence of acute bleeding Monitor Patient declines transfusions (Jehovah's Witness)  Goals of care discussion Remains full code   LOS: 2 days   Additional comments: Multidisciplinary rounds were performed with the ICU team.  Patient's sons were at the bedside, they were updated.  Critical Care Total Time*: 35 Minutes  C. Derrill Kay, MD [place] [facility] 03/21/2020  *Care during the described time interval was provided by me and/or other providers on the critical care team.  I have  reviewed this patient's available data, including medical history, events of note, physical examination and test results as part of my evaluation.  **This note was dictated using voice recognition software/Dragon.  Despite best efforts to proofread, errors can occur which can change the meaning.  Any change was purely unintentional.

## 2020-03-22 DIAGNOSIS — I5033 Acute on chronic diastolic (congestive) heart failure: Secondary | ICD-10-CM | POA: Diagnosis not present

## 2020-03-22 DIAGNOSIS — I5031 Acute diastolic (congestive) heart failure: Secondary | ICD-10-CM | POA: Diagnosis not present

## 2020-03-22 DIAGNOSIS — J9601 Acute respiratory failure with hypoxia: Secondary | ICD-10-CM | POA: Diagnosis not present

## 2020-03-22 DIAGNOSIS — I272 Pulmonary hypertension, unspecified: Secondary | ICD-10-CM | POA: Diagnosis not present

## 2020-03-22 LAB — BASIC METABOLIC PANEL
Anion gap: 13 (ref 5–15)
Anion gap: 12 (ref 5–15)
BUN: 36 mg/dL — ABNORMAL HIGH (ref 8–23)
BUN: 38 mg/dL — ABNORMAL HIGH (ref 8–23)
CO2: 20 mmol/L — ABNORMAL LOW (ref 22–32)
CO2: 23 mmol/L (ref 22–32)
Calcium: 8.3 mg/dL — ABNORMAL LOW (ref 8.9–10.3)
Calcium: 8.7 mg/dL — ABNORMAL LOW (ref 8.9–10.3)
Chloride: 110 mmol/L (ref 98–111)
Chloride: 110 mmol/L (ref 98–111)
Creatinine, Ser: 2.01 mg/dL — ABNORMAL HIGH (ref 0.44–1.00)
Creatinine, Ser: 1.89 mg/dL — ABNORMAL HIGH (ref 0.44–1.00)
GFR, Estimated: 23 mL/min — ABNORMAL LOW (ref >60)
GFR, Estimated: 25 mL/min — ABNORMAL LOW (ref >60)
Glucose, Bld: 129 mg/dL — ABNORMAL HIGH (ref 70–99)
Glucose, Bld: 122 mg/dL — ABNORMAL HIGH (ref 70–99)
Potassium: 3.4 mmol/L — ABNORMAL LOW (ref 3.5–5.1)
Potassium: 4 mmol/L (ref 3.5–5.1)
Sodium: 143 mmol/L (ref 135–145)
Sodium: 145 mmol/L (ref 135–145)

## 2020-03-22 LAB — CBC
HCT: 21.8 % — ABNORMAL LOW (ref 36.0–46.0)
Hemoglobin: 7.3 g/dL — ABNORMAL LOW (ref 12.0–15.0)
MCH: 25.8 pg — ABNORMAL LOW (ref 26.0–34.0)
MCHC: 33.5 g/dL (ref 30.0–36.0)
MCV: 77 fL — ABNORMAL LOW (ref 80.0–100.0)
Platelets: 352 10*3/uL (ref 150–400)
RBC: 2.83 MIL/uL — ABNORMAL LOW (ref 3.87–5.11)
RDW: 15.7 % — ABNORMAL HIGH (ref 11.5–15.5)
WBC: 13.3 10*3/uL — ABNORMAL HIGH (ref 4.0–10.5)
nRBC: 0.2 % (ref 0.0–0.2)

## 2020-03-22 LAB — RETICULOCYTES
Immature Retic Fract: 24.8 % — ABNORMAL HIGH (ref 2.3–15.9)
RBC.: 2.91 MIL/uL — ABNORMAL LOW (ref 3.87–5.11)
Retic Count, Absolute: 56.2 10*3/uL (ref 19.0–186.0)
Retic Ct Pct: 1.9 % (ref 0.4–3.1)

## 2020-03-22 LAB — IRON AND TIBC
Iron: 13 ug/dL — ABNORMAL LOW (ref 28–170)
Saturation Ratios: 7 % — ABNORMAL LOW (ref 10.4–31.8)
TIBC: 178 ug/dL — ABNORMAL LOW (ref 250–450)
UIBC: 165 ug/dL

## 2020-03-22 LAB — GLUCOSE, CAPILLARY
Glucose-Capillary: 113 mg/dL — ABNORMAL HIGH (ref 70–99)
Glucose-Capillary: 131 mg/dL — ABNORMAL HIGH (ref 70–99)
Glucose-Capillary: 108 mg/dL — ABNORMAL HIGH (ref 70–99)
Glucose-Capillary: 195 mg/dL — ABNORMAL HIGH (ref 70–99)
Glucose-Capillary: 196 mg/dL — ABNORMAL HIGH (ref 70–99)
Glucose-Capillary: 112 mg/dL — ABNORMAL HIGH (ref 70–99)

## 2020-03-22 LAB — FOLATE: Folate: 6.9 ng/mL (ref >5.9)

## 2020-03-22 LAB — FERRITIN: Ferritin: 470 ng/mL — ABNORMAL HIGH (ref 11–307)

## 2020-03-22 LAB — PROCALCITONIN: Procalcitonin: 2.95 ng/mL

## 2020-03-22 LAB — VITAMIN B12: Vitamin B-12: 885 pg/mL (ref 180–914)

## 2020-03-22 LAB — MAGNESIUM: Magnesium: 2 mg/dL (ref 1.7–2.4)

## 2020-03-22 LAB — PHOSPHORUS: Phosphorus: 4.1 mg/dL (ref 2.5–4.6)

## 2020-03-22 LAB — BRAIN NATRIURETIC PEPTIDE: B Natriuretic Peptide: 1070.4 pg/mL — ABNORMAL HIGH (ref 0.0–100.0)

## 2020-03-22 MED ORDER — AMLODIPINE BESYLATE 5 MG PO TABS
5.0000 mg | ORAL_TABLET | Freq: Every day | ORAL | Status: DC
  Administered 2020-03-22 – 2020-03-24 (×3): 5 mg via ORAL
  Filled 2020-03-22 (×3): qty 1

## 2020-03-22 MED ORDER — POTASSIUM CHLORIDE CRYS ER 20 MEQ PO TBCR
20.0000 meq | EXTENDED_RELEASE_TABLET | Freq: Two times a day (BID) | ORAL | Status: DC
  Administered 2020-03-22 (×2): 20 meq via ORAL
  Filled 2020-03-22 (×2): qty 1

## 2020-03-22 MED ORDER — FUROSEMIDE 10 MG/ML IJ SOLN
80.0000 mg | Freq: Two times a day (BID) | INTRAMUSCULAR | Status: DC
  Administered 2020-03-22 – 2020-03-26 (×9): 80 mg via INTRAVENOUS
  Filled 2020-03-22 (×9): qty 8
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
## 2020-03-22 NOTE — Progress Notes (Signed)
Progress Note  Patient Name: Sheri Ryan Date of Encounter: 03/22/2020  Primary Cardiologist: New to [facility] - consult by Fletcher Anon  Subjective   Dyspnea largely unchanged. No angina. She remains on BiPAP, hesitant to have this removed. Minimal documented UOP noted over the past 24 hours, though bladder scan showed residual 450 mL of urine in the bladder, now s/p Foley catheter. BNP down trending. Slight increase in BUN/SCr. HGB continues to down trend at 7.3 today. BP elevated in the 833A to 250N systolic.    Inpatient Medications    Scheduled Meds: . amLODipine  5 mg Oral Daily  . budesonide (PULMICORT) nebulizer solution  0.5 mg Nebulization BID  . Chlorhexidine Gluconate Cloth  6 each Topical Q0600  . furosemide  80 mg Intravenous BID  . heparin  5,000 Units Subcutaneous Q8H  . insulin aspart  0-20 Units Subcutaneous Q4H  . insulin glargine  10 Units Subcutaneous QHS  . ipratropium-albuterol  3 mL Nebulization Q4H  . mouth rinse  15 mL Mouth Rinse BID  . potassium chloride  20 mEq Oral BID  . sodium chloride flush  3 mL Intravenous Q12H   Continuous Infusions: . sodium chloride Stopped (03/20/20 1603)  . ceFEPime (MAXIPIME) IV Stopped (03/21/20 1213)  . famotidine (PEPCID) IV 20 mg (03/22/20 0943)  . nitroGLYCERIN 30 mcg/min (03/22/20 0600)   PRN Meds: sodium chloride, acetaminophen, dextrose, docusate sodium, fentaNYL (SUBLIMAZE) injection, hydrALAZINE, midazolam, morphine injection, ondansetron (ZOFRAN) IV, polyethylene glycol, sodium chloride flush   Vital Signs    Vitals:   03/22/20 0845 03/22/20 0900 03/22/20 0930 03/22/20 1000  BP: (!) 152/79 (!) 147/74 (!) 157/70 (!) 159/69  Pulse: (!) 102 (!) 103 (!) 103 (!) 108  Resp: (!) 22 19 (!) 22 18  Temp:      TempSrc:      SpO2: 100% 100% 100% 98%  Weight:      Height:        Intake/Output Summary (Last 24 hours) at 03/22/2020 1122 Last data filed at 03/22/2020 0900 Gross per 24 hour  Intake 324.57 ml   Output 1290 ml  Net -965.43 ml   Filed Weights   03/20/20 0500 03/21/20 0500 03/22/20 0500  Weight: 75 kg 73.8 kg 74 kg    Telemetry    SR, 80s bpm - Personally Reviewed  ECG    No new tracings - Personally Reviewed  Physical Exam   GEN: No acute distress.   Neck: JVD elevated to the angle of the mandible. Cardiac: RRR, II/VI systolic murmur at the base, no rubs, or gallops.  Respiratory: Diminished breath sounds bilaterally. BiPAP in place. GI: Soft, nontender, non-distended.   MS: No edema; No deformity. Neuro:  Alert and oriented x 3; Nonfocal.  Psych: Normal affect.  Labs    Chemistry Recent Labs  Lab 03/19/20 0814 03/19/20 1609 03/20/20 0500 03/21/20 0622 03/22/20 0502  NA 137   < > 140 141 143  K 4.0   < > 4.6 4.0 3.4*  CL 105   < > 109 109 110  CO2 15*   < > 19* 23 20*  GLUCOSE 375*   < > 123* 101* 129*  BUN 27*   < > 30* 31* 36*  CREATININE 1.72*   < > 1.68* 1.85* 2.01*  CALCIUM 8.3*   < > 8.7* 8.5* 8.3*  PROT 7.6  --   --   --   --   ALBUMIN 3.1*  --   --   --   --  AST 33  --   --   --   --   ALT 30  --   --   --   --   ALKPHOS 85  --   --   --   --   BILITOT 0.8  --   --   --   --   GFRNONAA 28*   < > 29* 26* 23*  ANIONGAP 17*   < > 12 9 13    < > = values in this interval not displayed.     Hematology Recent Labs  Lab 03/20/20 0500 03/21/20 0622 03/22/20 0502  WBC 16.5* 15.3* 13.3*  RBC 3.14* 3.11* 2.83*  HGB 8.2* 7.9* 7.3*  HCT 25.6* 24.8* 21.8*  MCV 81.5 79.7* 77.0*  MCH 26.1 25.4* 25.8*  MCHC 32.0 31.9 33.5  RDW 15.9* 15.9* 15.7*  PLT 332 360 352    Cardiac EnzymesNo results for input(s): TROPONINI in the last 168 hours. No results for input(s): TROPIPOC in the last 168 hours.   BNP Recent Labs  Lab 03/21/20 0622 03/21/20 1953 03/22/20 0502  BNP 1,699.8* 1,544.3* 1,070.4*     DDimer No results for input(s): DDIMER in the last 168 hours.   Radiology    DG Chest Port 1 View  Result Date: 03/21/2020 IMPRESSION: 1.  Decreased trace right and small left pleural effusions. 2. Stable extensive patchy opacity throughout both lungs, predominantly parahilar, differential includes multifocal pneumonia or persistent pulmonary edema. Electronically Signed   By: Ilona Sorrel M.D.   On: 03/21/2020 08:35   Cardiac Studies   2D echo 03/20/2020: 1. Left ventricular ejection fraction, by estimation, is 55 to 60%. The  left ventricle has normal function. Left ventricular endocardial border  not optimally defined to evaluate regional wall motion. There is mild left  ventricular hypertrophy. Left  ventricular diastolic parameters are indeterminate.  2. Right ventricular systolic function is normal. The right ventricular  size is normal. There is moderately elevated pulmonary artery systolic  pressure. The estimated right ventricular systolic pressure is 22.9 mmHg.  3. Left atrial size was mildly dilated.  4. The mitral valve is normal in structure. Moderate mitral valve  regurgitation. No evidence of mitral stenosis.  5. The aortic valve is normal in structure. Aortic valve regurgitation is  not visualized. Mild to moderate aortic valve sclerosis/calcification is  present, without any evidence of aortic stenosis.  Patient Profile     87 y.o. 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 Patsey Berthold.  Assessment & Plan    1. Acute hypoxic respiratory failure: -Multifactorial including PNA, pulmonary edema, pulmonary hypertension, anemia, and MR -Wean BiPAP as tolerated   2. Pulmonary hypertension with likely diastolic dysfunction: -Diastolic function parameters were indeterminate on echo, though suspect this is contributing  -Minimal UOP noted with IV Lasix 40 mg x 1 on 3/7, though there was significant residual urine noted on bladder scan -Start IV Lasix 80 bid  now that she has a Foley catheter in place and assess UOP and respiratory status  -If UOP remains low and her dyspnea persists, she may need a RHC -Strict I/O  3. Elevated high sensitivity troponin: -No chest pain -Initial HS-Tn 34 with a current value of 491, continue to cycle until peak -Unable to add heparin gtt at this time with underlying anemia with HGB 7.9 -Echo  with preserved LVSF, though not optimal for evaluation of WMA -Cannot exclude supply demand ischemia in the setting of PNA, pulmonary edema, anemia, and AKI -Consider ischemic evaluation once her acute pulmonary illness, volume status, renal function, and anemia have been evaluated and corrected  -Taper off nitro gtt as able today  4. Mitral regurgitation: -Possibly functional in the setting of volume overload -Can reassess as an outpatient   5. CKD stage IIIa: -Slight increase today -Baseline SCr appears to be around 1.6-1.8 -Monitor with IV diuresis as above  6. Anemia: -HGB 8.8 upon admission with a current value of 7.3 with baseline appearing to be around 11 by CBC in 11/2019 -There has been some hemoptysis, suspected to be related to capillary rupture with pulmonary edema after discussing with CCM -She is a Sales promotion account executive witness  -Work up and management per CCM  7. HTN: -Add amlodipine -IV Lasix as above  For questions or updates, please contact [place] Please consult [email or web address] for contact info under Cardiology/STEMI.    Signed, Christell Faith, PA-C [place] Pager: [phone] 03/22/2020, 11:22 AM

## 2020-03-23 ENCOUNTER — Inpatient Hospital Stay: Payer: Medicare Other

## 2020-03-23 DIAGNOSIS — I5031 Acute diastolic (congestive) heart failure: Secondary | ICD-10-CM | POA: Diagnosis not present

## 2020-03-23 LAB — BASIC METABOLIC PANEL
Anion gap: 14 (ref 5–15)
BUN: 42 mg/dL — ABNORMAL HIGH (ref 8–23)
CO2: 16 mmol/L — ABNORMAL LOW (ref 22–32)
Calcium: 8.7 mg/dL — ABNORMAL LOW (ref 8.9–10.3)
Chloride: 113 mmol/L — ABNORMAL HIGH (ref 98–111)
Creatinine, Ser: 2.4 mg/dL — ABNORMAL HIGH (ref 0.44–1.00)
GFR, Estimated: 19 mL/min — ABNORMAL LOW (ref >60)
Glucose, Bld: 128 mg/dL — ABNORMAL HIGH (ref 70–99)
Potassium: 4.9 mmol/L (ref 3.5–5.1)
Sodium: 143 mmol/L (ref 135–145)

## 2020-03-23 LAB — CBC
HCT: 22.6 % — ABNORMAL LOW (ref 36.0–46.0)
Hemoglobin: 7.6 g/dL — ABNORMAL LOW (ref 12.0–15.0)
MCH: 26 pg (ref 26.0–34.0)
MCHC: 33.6 g/dL (ref 30.0–36.0)
MCV: 77.4 fL — ABNORMAL LOW (ref 80.0–100.0)
Platelets: 324 10*3/uL (ref 150–400)
RBC: 2.92 MIL/uL — ABNORMAL LOW (ref 3.87–5.11)
RDW: 15.9 % — ABNORMAL HIGH (ref 11.5–15.5)
WBC: 13.5 10*3/uL — ABNORMAL HIGH (ref 4.0–10.5)
nRBC: 0.2 % (ref 0.0–0.2)

## 2020-03-23 LAB — URINALYSIS, ROUTINE W REFLEX MICROSCOPIC
Bilirubin Urine: NEGATIVE
Glucose, UA: NEGATIVE mg/dL
Ketones, ur: NEGATIVE mg/dL
Nitrite: NEGATIVE
Protein, ur: 100 mg/dL — AB
Specific Gravity, Urine: 1.008 (ref 1.005–1.030)
Squamous Epithelial / HPF: >50 — ABNORMAL HIGH (ref 0–5)
WBC, UA: >50 WBC/hpf — ABNORMAL HIGH (ref 0–5)
pH: 5 (ref 5.0–8.0)

## 2020-03-23 LAB — PROTEIN / CREATININE RATIO, URINE
Creatinine, Urine: 25 mg/dL
Protein Creatinine Ratio: 4.4 mg/mg{Cre} — ABNORMAL HIGH (ref 0.00–0.15)
Total Protein, Urine: 110 mg/dL

## 2020-03-23 LAB — GLUCOSE, CAPILLARY
Glucose-Capillary: 117 mg/dL — ABNORMAL HIGH (ref 70–99)
Glucose-Capillary: 164 mg/dL — ABNORMAL HIGH (ref 70–99)
Glucose-Capillary: 210 mg/dL — ABNORMAL HIGH (ref 70–99)
Glucose-Capillary: 125 mg/dL — ABNORMAL HIGH (ref 70–99)
Glucose-Capillary: 109 mg/dL — ABNORMAL HIGH (ref 70–99)
Glucose-Capillary: 93 mg/dL (ref 70–99)

## 2020-03-23 LAB — MAGNESIUM: Magnesium: 2.1 mg/dL (ref 1.7–2.4)

## 2020-03-23 LAB — ALBUMIN: Albumin: 2.5 g/dL — ABNORMAL LOW (ref 3.5–5.0)

## 2020-03-23 MED ORDER — SODIUM CHLORIDE 0.9 % IV SOLN
100.0000 mg | Freq: Once | INTRAVENOUS | Status: AC
  Administered 2020-03-23: 100 mg via INTRAVENOUS
  Filled 2020-03-23: qty 5

## 2020-03-23 MED ORDER — FAMOTIDINE 40 MG/5ML PO SUSR
20.0000 mg | Freq: Every day | ORAL | Status: DC
  Administered 2020-03-24 – 2020-03-30 (×7): 20 mg via ORAL
  Filled 2020-03-23 (×7): qty 2.5

## 2020-03-23 NOTE — Progress Notes (Signed)
Progress Note  Patient Name: Sheri Ryan Date of Encounter: 03/23/2020  Primary Cardiologist: New to [facility] - consult by Dorenda Pfannenstiel  Subjective   Urine output improved over the last 24 hours after increasing furosemide to 80 mg twice daily.  She is off BiPAP currently on high flow nasal cannula.  She reports some improvement in shortness of breath.  Inpatient Medications    Scheduled Meds: . amLODipine  5 mg Oral Daily  . budesonide (PULMICORT) nebulizer solution  0.5 mg Nebulization BID  . Chlorhexidine Gluconate Cloth  6 each Topical Q0600  . furosemide  80 mg Intravenous BID  . heparin  5,000 Units Subcutaneous Q8H  . insulin aspart  0-20 Units Subcutaneous Q4H  . insulin glargine  10 Units Subcutaneous QHS  . ipratropium-albuterol  3 mL Nebulization Q4H  . mouth rinse  15 mL Mouth Rinse BID  . sodium chloride flush  3 mL Intravenous Q12H   Continuous Infusions: . sodium chloride Stopped (03/20/20 1603)  . ceFEPime (MAXIPIME) IV Stopped (03/22/20 1215)  . famotidine (PEPCID) IV 20 mg (03/23/20 0921)  . iron sucrose    . nitroGLYCERIN 30 mcg/min (03/23/20 0700)   PRN Meds: sodium chloride, acetaminophen, dextrose, docusate sodium, fentaNYL (SUBLIMAZE) injection, hydrALAZINE, midazolam, morphine injection, ondansetron (ZOFRAN) IV, polyethylene glycol, sodium chloride flush   Vital Signs    Vitals:   03/23/20 0500 03/23/20 0600 03/23/20 0700 03/23/20 0804  BP: (!) 141/72 (!) 147/72 131/73   Pulse: 81 81 79   Resp: (!) 21 (!) 24 17   Temp:      TempSrc:      SpO2: 96% 99% 96% 98%  Weight:      Height:        Intake/Output Summary (Last 24 hours) at 03/23/2020 0952 Last data filed at 03/23/2020 0700 Gross per 24 hour  Intake 328.42 ml  Output 1495 ml  Net -1166.58 ml   Filed Weights   03/20/20 0500 03/21/20 0500 03/22/20 0500  Weight: 75 kg 73.8 kg 74 kg    Telemetry    SR, 80s bpm - Personally Reviewed  ECG    No new tracings - Personally  Reviewed  Physical Exam   GEN: No acute distress.   Neck: JVD elevated to the angle of the mandible. Cardiac: RRR, II/VI systolic murmur at the base, no rubs, or gallops.  Respiratory: Diminished breath sounds bilaterally.  Bibasilar crackles GI: Soft, nontender, non-distended.   MS: No edema; No deformity. Neuro:  Alert and oriented x 3; Nonfocal.  Psych: Normal affect.  Labs    Chemistry Recent Labs  Lab 03/19/20 0814 03/19/20 1609 03/22/20 0502 03/22/20 1121 03/23/20 0705  NA 137   < > 143 145 143  K 4.0   < > 3.4* 4.0 4.9  CL 105   < > 110 110 113*  CO2 15*   < > 20* 23 16*  GLUCOSE 375*   < > 129* 122* 128*  BUN 27*   < > 36* 38* 42*  CREATININE 1.72*   < > 2.01* 1.89* 2.40*  CALCIUM 8.3*   < > 8.3* 8.7* 8.7*  PROT 7.6  --   --   --   --   ALBUMIN 3.1*  --   --   --   --   AST 33  --   --   --   --   ALT 30  --   --   --   --  ALKPHOS 85  --   --   --   --   BILITOT 0.8  --   --   --   --   GFRNONAA 28*   < > 23* 25* 19*  ANIONGAP 17*   < > 13 12 14    < > = values in this interval not displayed.     Hematology Recent Labs  Lab 03/21/20 0622 03/22/20 0502 03/22/20 1121 03/23/20 0705  WBC 15.3* 13.3*  --  13.5*  RBC 3.11* 2.83* 2.91* 2.92*  HGB 7.9* 7.3*  --  7.6*  HCT 24.8* 21.8*  --  22.6*  MCV 79.7* 77.0*  --  77.4*  MCH 25.4* 25.8*  --  26.0  MCHC 31.9 33.5  --  33.6  RDW 15.9* 15.7*  --  15.9*  PLT 360 352  --  324    Cardiac EnzymesNo results for input(s): TROPONINI in the last 168 hours. No results for input(s): TROPIPOC in the last 168 hours.   BNP Recent Labs  Lab 03/21/20 0622 03/21/20 1953 03/22/20 0502  BNP 1,699.8* 1,544.3* 1,070.4*     DDimer No results for input(s): DDIMER in the last 168 hours.   Radiology    DG Chest Port 1 View  Result Date: 03/21/2020 IMPRESSION: 1. Decreased trace right and small left pleural effusions. 2. Stable extensive patchy opacity throughout both lungs, predominantly parahilar, differential  includes multifocal pneumonia or persistent pulmonary edema. Electronically Signed   By: Ilona Sorrel M.D.   On: 03/21/2020 08:35   Cardiac Studies   2D echo 03/20/2020: 1. Left ventricular ejection fraction, by estimation, is 55 to 60%. The  left ventricle has normal function. Left ventricular endocardial border  not optimally defined to evaluate regional wall motion. There is mild left  ventricular hypertrophy. Left  ventricular diastolic parameters are indeterminate.  2. Right ventricular systolic function is normal. The right ventricular  size is normal. There is moderately elevated pulmonary artery systolic  pressure. The estimated right ventricular systolic pressure is 13.0 mmHg.  3. Left atrial size was mildly dilated.  4. The mitral valve is normal in structure. Moderate mitral valve  regurgitation. No evidence of mitral stenosis.  5. The aortic valve is normal in structure. Aortic valve regurgitation is  not visualized. Mild to moderate aortic valve sclerosis/calcification is  present, without any evidence of aortic stenosis.  Patient Profile     87 y.o. 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 Patsey Berthold.  Assessment & Plan    1. Acute hypoxic respiratory failure: -Initially thought to be multifactorial but thanks very findings are highly suggestive of pulmonary edema.  There seems to be a high inflammatory response as well and thus atypical infectious etiology cannot be completely excluded.  2. Pulmonary hypertension with likely diastolic dysfunction: -Clinically she seems to be slightly improved over the last 2 days with less oxygen requirement.  However, her chest x-ray looks worse with significant pulmonary edema. In spite of worsening renal function, I recommend continuing IV furosemide 80 mg twice daily. I do feel  that worsening anemia and low albumin are also contributing. No plans for invasive cardiac evaluation at the present time.  3.  Mildly elevated troponin: Likely supply demand ischemia in the setting of the above.  Given her age and comorbidities, no plans for ischemic cardiac evaluation.  4. Mitral regurgitation: -Moderate mitral regurgitation by echo.  This is likely contributing as well to have heart failure.  5. CKD stage IIIa: -Slight increase today -Baseline SCr appears to be around 1.6-1.8 -Monitor with IV diuresis as above  6. Anemia: -HGB 8.8 upon admission with gradual decline.  Today it is 7.6.  The patient does not accept blood transfusion for religious reasons.  Iron studies consistent suggestive of iron deficiency anemia.  Consider iron supplementation as well as Epogen.  7. HTN: -Blood pressure improved with addition of amlodipine.   This is a complex case overall and I had an extensive discussion with Dr. Lanney Gins regarding improving anemia, supplementing albumin and clarifying goals of treatment.  A palliative care consult is appropriate.  For questions or updates, please contact [place] Please consult [email or web address] for contact info under Cardiology/STEMI.    Signed, Kathlyn [place], MD [facility] 03/23/2020, 9:52 AM

## 2020-03-23 NOTE — Progress Notes (Signed)
[facility]  ROUNDING NOTE   Subjective:   [name] was admitted to [facility] on 03/19/2020 for Respiratory failure [facility]) [J96.90] Acute pulmonary edema ([place]) [J81.0] Acute respiratory failure with hypoxia ([place]) [J96.01] Diabetic ketoacidosis without coma associated with type 2 diabetes mellitus ([place]) [E11.10] Anemia, unspecified type [D64.9]  Patient was seen by nephrology on 3/2 as an initial consult for chronic kidney disease stage IIIB, hypertension, diabetic nephropathy, and nephrotic range proteinuria.   Patient presented to [facility] ICU with shortness of breath. Found to have acute bacterial pneumonia and acute exacerbation of systolic congestive heart failure. Echocardiogram on admission with preserved systolic function.  Patient found to be in Diabetic ketoacidosis.  Placed on BIPAP. Now transitioned to high flow nasal canula.   Nephrology consulted for worsening kidney injury with metabolic acidosis and anemia  Objective:  Vital signs in last 24 hours:  Temp:  [96.9 F (36.1 C)-97.2 F (36.2 C)] 97.2 F (36.2 C) (03/09 0200) Pulse Rate:  [79-98] 79 (03/09 0700) Resp:  [17-34] 17 (03/09 0700) BP: (116-151)/(51-91) 131/73 (03/09 0700) SpO2:  [87 %-99 %] 98 % (03/09 0804) FiO2 (%):  [50 %] 50 % (03/09 0804)  Weight change:  Filed Weights   03/20/20 0500 03/21/20 0500 03/22/20 0500  Weight: 75 kg 73.8 kg 74 kg    Intake/Output: I/O last 3 completed shifts: In: 430.2 [I.V.:330.2; IV Piggyback:100] Out: 1985 [GYFVC:[id number]]   Intake/Output this shift:  Total I/O In: 120 [P.O.:120] Out: -   Physical Exam: General: Critically ill  Head: HFNC+ +hard of hearing  Eyes: Anicteric, PERRL  Neck: Supple, trachea midline  Lungs:  Bilateral rhonchi and crackles  Heart: Regular rate and rhythm  Abdomen:  Soft, nontender  Extremities: no peripheral edema.  Neurologic: Nonfocal, moving all four extremities  Skin: No lesions  GU: Foley with urine     Basic Metabolic Panel: Recent Labs  Lab 03/19/20 0814 03/19/20 1609 03/20/20 0500 03/21/20 0622 03/22/20 0502 03/22/20 1121 03/23/20 0705  NA 137   < > 140 141 143 145 143  K 4.0   < > 4.6 4.0 3.4* 4.0 4.9  CL 105   < > 109 109 110 110 113*  CO2 15*   < > 19* 23 20* 23 16*  GLUCOSE 375*   < > 123* 101* 129* 122* 128*  BUN 27*   < > 30* 31* 36* 38* 42*  CREATININE 1.72*   < > 1.68* 1.85* 2.01* 1.89* 2.40*  CALCIUM 8.3*   < > 8.7* 8.5* 8.3* 8.7* 8.7*  MG 2.2  --  2.1 2.0 2.0  --  2.1  PHOS 4.0  --  4.7* 4.4 4.1  --   --    < > = values in this interval not displayed.    Liver Function Tests: Recent Labs  Lab 03/19/20 0814  AST 33  ALT 30  ALKPHOS 85  BILITOT 0.8  PROT 7.6  ALBUMIN 3.1*   No results for input(s): LIPASE, AMYLASE in the last 168 hours. No results for input(s): AMMONIA in the last 168 hours.  CBC: Recent Labs  Lab 03/19/20 0814 03/20/20 0500 03/21/20 0622 03/22/20 0502 03/23/20 0705  WBC 15.6* 16.5* 15.3* 13.3* 13.5*  HGB 8.8* 8.2* 7.9* 7.3* 7.6*  HCT 27.5* 25.6* 24.8* 21.8* 22.6*  MCV 80.4 81.5 79.7* 77.0* 77.4*  PLT 330 332 360 352 324    Cardiac Enzymes: No results for input(s): CKTOTAL, CKMB, CKMBINDEX, TROPONINI in the last 168 hours.  BNP: Invalid input(s): POCBNP  CBG: Recent Labs  Lab 03/22/20 1627 03/22/20 1919 03/22/20 2307 03/23/20 0318 03/23/20 0724  GLUCAP 195* 196* 112* 117* 164*    Microbiology: Results for orders placed or performed during the hospital encounter of 03/19/20  Blood culture (routine x 2)     Status: None (Preliminary result)   Collection Time: 03/19/20  8:14 AM   Specimen: BLOOD  Result Value Ref Range Status   Specimen Description BLOOD RIGHT FOREARM  Final   Special Requests   Final    BOTTLES DRAWN AEROBIC AND ANAEROBIC Blood Culture results may not be optimal due to an excessive volume of blood received in culture bottles   Culture   Final    NO GROWTH 4 DAYS Performed at [facility], [address] [place], [place] [address]    Report Status PENDING  Incomplete  Blood culture (routine x 2)     Status: None (Preliminary result)   Collection Time: 03/19/20  8:14 AM   Specimen: BLOOD  Result Value Ref Range Status   Specimen Description BLOOD RIGHT HAND  Final   Special Requests   Final    BOTTLES DRAWN AEROBIC AND ANAEROBIC Blood Culture adequate volume   Culture   Final    NO GROWTH 4 DAYS Performed at [facility], [address], [place], [place] [address]    Report Status PENDING  Incomplete  Resp Panel by RT-PCR (Flu A&B, Covid) Nasopharyngeal Swab     Status: None   Collection Time: 03/19/20  8:14 AM   Specimen: Nasopharyngeal Swab; Nasopharyngeal(NP) swabs in vial transport medium  Result Value Ref Range Status   SARS Coronavirus 2 by RT PCR NEGATIVE NEGATIVE Final    Comment: (NOTE) SARS-CoV-2 target nucleic acids are NOT DETECTED.  The SARS-CoV-2 RNA is generally detectable in upper respiratory specimens during the acute phase of infection. The lowest concentration of SARS-CoV-2 viral copies this assay can detect is 138 copies/mL. A negative result does not preclude SARS-Cov-2 infection and should not be used as the sole basis for treatment or other patient management decisions. A negative result may occur with  improper specimen collection/handling, submission of specimen other than nasopharyngeal swab, presence of viral mutation(s) within the areas targeted by this assay, and inadequate number of viral copies(<138 copies/mL). A negative result must be combined with clinical observations, patient history, and epidemiological information. The expected result is Negative.  Fact Sheet for Patients:  EntrepreneurPulse.com.au  Fact Sheet for Healthcare Providers:  IncredibleEmployment.be  This test is no t yet approved or cleared by the [place] FDA and  has been authorized for  detection and/or diagnosis of SARS-CoV-2 by FDA under an Emergency Use Authorization (EUA). This EUA will remain  in effect (meaning this test can be used) for the duration of the COVID-19 declaration under Section 564(b)(1) of the Act, 21 U.S.C.section 360bbb-3(b)(1), unless the authorization is terminated  or revoked sooner.       Influenza A by PCR NEGATIVE NEGATIVE Final   Influenza B by PCR NEGATIVE NEGATIVE Final    Comment: (NOTE) The Xpert Xpress SARS-CoV-2/FLU/RSV plus assay is intended as an aid in the diagnosis of influenza from Nasopharyngeal swab specimens and should not be used as a sole basis for treatment. Nasal washings and aspirates are unacceptable for Xpert Xpress SARS-CoV-2/FLU/RSV testing.  Fact Sheet for Patients: EntrepreneurPulse.com.au  Fact Sheet for Healthcare Providers: IncredibleEmployment.be  This test is not yet approved or cleared by the [place]  FDA and has been authorized for detection and/or diagnosis of SARS-CoV-2 by FDA under an Emergency Use Authorization (EUA). This EUA will remain in effect (meaning this test can be used) for the duration of the COVID-19 declaration under Section 564(b)(1) of the Act, 21 U.S.C. section 360bbb-3(b)(1), unless the authorization is terminated or revoked.  Performed at [facility], [address] [place], [place] [address]   MRSA PCR Screening     Status: None   Collection Time: 03/19/20 11:17 AM   Specimen: Nasopharyngeal  Result Value Ref Range Status   MRSA by PCR NEGATIVE NEGATIVE Final    Comment:        The GeneXpert MRSA Assay (FDA approved for NASAL specimens only), is one component of a comprehensive MRSA colonization surveillance program. It is not intended to diagnose MRSA infection nor to guide or monitor treatment for MRSA infections. Performed at [facility], [address] [place], [place] [address]     Coagulation  Studies: No results for input(s): LABPROT, INR in the last 72 hours.  Urinalysis: No results for input(s): COLORURINE, LABSPEC, PHURINE, GLUCOSEU, HGBUR, BILIRUBINUR, KETONESUR, PROTEINUR, UROBILINOGEN, NITRITE, LEUKOCYTESUR in the last 72 hours.  Invalid input(s): APPERANCEUR    Imaging: DG Chest Port 1 View  Result Date: 03/23/2020 CLINICAL DATA:  Pulmonary edema. EXAM: PORTABLE CHEST 1 VIEW COMPARISON:  03/21/2020. FINDINGS: Cardiomegaly again noted. Diffuse severe bilateral pulmonary infiltrates/edema again noted. Low lung volumes. Small bilateral pleural effusions cannot be excluded. No pneumothorax. Degenerative change thoracic spine. IMPRESSION: 1. Cardiomegaly again noted. 2. Diffuse severe bilateral pulmonary infiltrates/edema again noted. Small bilateral pleural effusions cannot be excluded. Electronically Signed   By: Marcello Moores  Register   On: 03/23/2020 05:57     Medications:   . sodium chloride Stopped (03/20/20 1603)  . famotidine (PEPCID) IV 20 mg (03/23/20 0921)   . amLODipine  5 mg Oral Daily  . Chlorhexidine Gluconate Cloth  6 each Topical Q0600  . furosemide  80 mg Intravenous BID  . heparin  5,000 Units Subcutaneous Q8H  . insulin aspart  0-20 Units Subcutaneous Q4H  . insulin glargine  10 Units Subcutaneous QHS  . mouth rinse  15 mL Mouth Rinse BID  . sodium chloride flush  3 mL Intravenous Q12H   sodium chloride, acetaminophen, dextrose, docusate sodium, hydrALAZINE, midazolam, morphine injection, ondansetron (ZOFRAN) IV, polyethylene glycol, sodium chloride flush  Assessment/ Plan:  [name] is a 87 y.o. black Jehovah witness female with diabetes mellitus type II, diabetic retinopathy, hypertension, left eye blindness, congestive heart failure, who is admitted to [facility] on 03/19/2020 for Respiratory failure ([place]) [J96.90] Acute pulmonary edema ([place]) [J81.0] Acute respiratory failure with hypoxia ([place]) [J96.01] Diabetic ketoacidosis without coma  associated with type 2 diabetes mellitus ([place]) [E11.10] Anemia, unspecified type [D64.9]  1. Acute kidney injury on chronic kidney disease stage IIIB with nephrotic range proteinuria. (>7 grams) Baseline creatinine of 1.8, GFR of 32 on 02/19/2020.  Chronic kidney disease thought to be secondary to diabetic nephropathy although serologic work up not complete.  ANA negative. SPEP/UPEP negative.  Holding home lisinopril dose.  - Recheck urine studies.  - Check serologic studies - may need a kidney biopsy when more stable.   2. Acute exacerbation of congestive heart failure:  - IV furosemide - low salt diet - fluid restriction  3. Hypertension: home regimen of lisinopril, amlodipine and nebivolol.  - currently on a NTG gtt.  - IV furosemide as above - restarted amlodipine.  4. Anemia with kidney failure: hemoglobin 7.6 microcytic. With iron deficiency. 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  - Check serum albumin level.  - If IV albumin would be of benefit, will discuss with patient.  - Continue IV iron.   5. Diabetes mellitus type II with chronic kidney disease: noninsulin dependent at baseline. Currently on insulin glargine and sliding scale. Hemoglobin A1c of 7.5% on 02/19/20.  Not currently on an SGLT-2 inhibitor.    LOS: 4 Darnell Jeschke 3/9/202210:50 AM

## 2020-03-23 NOTE — Consult Note (Addendum)
PHARMACY CONSULT NOTE - FOLLOW UP  Pharmacy Consult for Electrolyte Monitoring and Replacement   Recent Labs: Potassium (mmol/L)  Date Value  03/23/2020 4.9   Magnesium (mg/dL)  Date Value  03/23/2020 2.1   Calcium (mg/dL)  Date Value  03/23/2020 8.7 (L)   Albumin (g/dL)  Date Value  03/19/2020 3.1 (L)  07/31/2019 3.9   Phosphorus (mg/dL)  Date Value  03/22/2020 4.1   Sodium (mmol/L)  Date Value  03/23/2020 143  07/31/2019 137    Assessment: 87 yo F presented with acute respiratory failure likely due to pulmonary edema. Pt on furosemide 80 mg BID, continuing with worsening renal function due to worsening edema on CXR. Pt with low hemoglobin, unable to transfuse for religious reasons. Nephrology consulted for worsening metabolic acidosis and anemia in setting of CKD with elevated serum creatinine.    Goal of Therapy:  Electrolytes WNL  Plan:  --All electrolytes WNL, no electrolyte replacement needed at this time --KCl 20 mEq BID discontinued due to K 4.0>4.9 --Follow-up electrolytes with AM labs tomorrow  Benn Moulder, PharmD Pharmacy Resident  03/23/2020 9:47 AM
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
## 2020-03-23 NOTE — Plan of Care (Signed)
Pt tolerated bed in chair position x two hours his shift, verbalized understanding of her POC w/ some repeating of education, she states she now understands that lab blood draws are not responsible for her low hemoglobin, she reiterates she will not accept a blood transfusion.  She was educated extensively re the condition of her kidneys by intensivist and nephrologist.  [facility] at 45L/50% FIO2 all day, she reports no SOB while receiving this therapy.  SW to bedside to speak with patient and family also this shift.

## 2020-03-24 DIAGNOSIS — D649 Anemia, unspecified: Secondary | ICD-10-CM | POA: Diagnosis not present

## 2020-03-24 DIAGNOSIS — J810 Acute pulmonary edema: Secondary | ICD-10-CM | POA: Diagnosis not present

## 2020-03-24 DIAGNOSIS — J9601 Acute respiratory failure with hypoxia: Secondary | ICD-10-CM | POA: Diagnosis not present

## 2020-03-24 DIAGNOSIS — N1832 Chronic kidney disease, stage 3b: Secondary | ICD-10-CM | POA: Diagnosis not present

## 2020-03-24 LAB — CULTURE, BLOOD (ROUTINE X 2)
Culture: NO GROWTH
Culture: NO GROWTH
Special Requests: ADEQUATE

## 2020-03-24 LAB — MPO/PR-3 (ANCA) ANTIBODIES
ANCA Proteinase 3: <3.5 U/mL (ref 0.0–3.5)
Myeloperoxidase Abs: <9 U/mL (ref 0.0–9.0)

## 2020-03-24 LAB — BASIC METABOLIC PANEL
Anion gap: 10 (ref 5–15)
BUN: 41 mg/dL — ABNORMAL HIGH (ref 8–23)
CO2: 27 mmol/L (ref 22–32)
Calcium: 8.8 mg/dL — ABNORMAL LOW (ref 8.9–10.3)
Chloride: 106 mmol/L (ref 98–111)
Creatinine, Ser: 2.21 mg/dL — ABNORMAL HIGH (ref 0.44–1.00)
GFR, Estimated: 21 mL/min — ABNORMAL LOW (ref >60)
Glucose, Bld: 87 mg/dL (ref 70–99)
Potassium: 4.6 mmol/L (ref 3.5–5.1)
Sodium: 143 mmol/L (ref 135–145)

## 2020-03-24 LAB — PHOSPHORUS: Phosphorus: 3.9 mg/dL (ref 2.5–4.6)

## 2020-03-24 LAB — C4 COMPLEMENT: Complement C4, Body Fluid: 44 mg/dL — ABNORMAL HIGH (ref 12–38)

## 2020-03-24 LAB — GLUCOSE, CAPILLARY
Glucose-Capillary: 88 mg/dL (ref 70–99)
Glucose-Capillary: 89 mg/dL (ref 70–99)
Glucose-Capillary: 199 mg/dL — ABNORMAL HIGH (ref 70–99)
Glucose-Capillary: 169 mg/dL — ABNORMAL HIGH (ref 70–99)
Glucose-Capillary: 107 mg/dL — ABNORMAL HIGH (ref 70–99)
Glucose-Capillary: 116 mg/dL — ABNORMAL HIGH (ref 70–99)

## 2020-03-24 LAB — ANCA TITERS
Atypical P-ANCA titer: <1:20 {titer}
C-ANCA: <1:20 {titer}
P-ANCA: <1:20 {titer}

## 2020-03-24 LAB — GLOMERULAR BASEMENT MEMBRANE ANTIBODIES: GBM Ab: 5 units (ref 0–20)

## 2020-03-24 LAB — MAGNESIUM: Magnesium: 2.1 mg/dL (ref 1.7–2.4)

## 2020-03-24 LAB — C3 COMPLEMENT: C3 Complement: 147 mg/dL (ref 82–167)

## 2020-03-24 MED ORDER — AMLODIPINE BESYLATE 5 MG PO TABS
5.0000 mg | ORAL_TABLET | Freq: Once | ORAL | Status: AC
  Administered 2020-03-24: 5 mg via ORAL
  Filled 2020-03-24: qty 1

## 2020-03-24 MED ORDER — NEBIVOLOL HCL 10 MG PO TABS
10.0000 mg | ORAL_TABLET | Freq: Every day | ORAL | Status: DC
  Administered 2020-03-24 – 2020-03-30 (×7): 10 mg via ORAL
  Filled 2020-03-24 (×7): qty 1

## 2020-03-24 MED ORDER — AMLODIPINE BESYLATE 10 MG PO TABS
10.0000 mg | ORAL_TABLET | Freq: Every day | ORAL | Status: DC
  Administered 2020-03-25 – 2020-03-29 (×5): 10 mg via ORAL
  Filled 2020-03-24 (×5): qty 1

## 2020-03-24 NOTE — Evaluation (Signed)
Occupational Therapy Evaluation Patient Details Name: Sheri Ryan MRN: [id number] DOB: Jun 14, 1929 Today's Date: 03/24/2020    History of Present Illness 87 yo AAF with Severe ACUTE Hypoxic and Hypercapnic Respiratory Failure due to bilateral infiltrates with acute bacterial/viral pneumonia with findings of acute S CHF exacerbation with acute on chronic renal failure with acidosis and DKA. PMH of Blind left eye, Cataract, Chronic kidney disease, Diabetes mellitus without complication ([place]), Hypertension, and Retinopathy.   Clinical Impression   Sheri Ryan was seen for OT/PT co-evaluation this date. Prior to hospital admission, pt was Independent for mobility and ADLs. Pt lives c husband and grandson in home with 5 STE and L rail, family available 24/7. Pt presents to acute OT demonstrating impaired ADL performance and functional mobility 2/2 decreased activity tolerance, functional strength/ROM/balance deficits, and poor safety awareness. Pt currently requires CGA for LB access seated EOB. MIN A + RW for simulated toilet t/f. SpO2 98-100% on 40L HHFNC t/o mobility. Pt would benefit from skilled OT to address noted impairments and functional limitations (see below for any additional details) in order to maximize safety and independence while minimizing falls risk and caregiver burden. Upon hospital discharge, recommend HHOT to maximize pt safety and return to functional independence during meaningful occupations of daily life.     Follow Up Recommendations  Home health OT    Equipment Recommendations  None recommended by OT    Recommendations for Other Services       Precautions / Restrictions Precautions Precautions: Fall Restrictions Weight Bearing Restrictions: No      Mobility Bed Mobility Overal bed mobility: Needs Assistance Bed Mobility: Supine to Sit;Sit to Supine     Supine to sit: Min assist Sit to supine: Min assist        Transfers Overall transfer  level: Needs assistance Equipment used: Rolling walker (2 wheeled) Transfers: Sit to/from Stand Sit to Stand: Min assist         General transfer comment: Assist for lift off and cues for hand placement    Balance Overall balance assessment: Needs assistance Sitting-balance support: No upper extremity supported;Feet supported Sitting balance-Leahy Scale: Fair     Standing balance support: Bilateral upper extremity supported Standing balance-Leahy Scale: Good                             ADL either performed or assessed with clinical judgement   ADL Overall ADL's : Needs assistance/impaired                                       General ADL Comments: CGA for LB access seated EOB. MIN A + RW for ADL t/f and simulated toilet t/f.                  Pertinent Vitals/Pain Pain Assessment: No/denies pain     Hand Dominance Right   Extremity/Trunk Assessment Upper Extremity Assessment Upper Extremity Assessment: LUE deficits/detail LUE Deficits / Details: Reports baseline shoulder weakness - shoulder flexion 2-/5, PROM WFL   Lower Extremity Assessment Lower Extremity Assessment: Generalized weakness       Communication Communication Communication: HOH   Cognition Arousal/Alertness: Awake/alert Behavior During Therapy: WFL for tasks assessed/performed Overall Cognitive Status: Within Functional Limits for tasks assessed  General Comments  SpO2 98-100% on 40L HHFNC t/o mobility    Exercises Exercises: Other exercises Other Exercises Other Exercises: Pt educated re: OT role, DME recs, d/c recs, falls prevention, HEP, ECS Other Exercises: LBD, sup<>sit, sit<>stand, sitting/standing balance/tolerance   Shoulder Instructions      Home Living Family/patient expects to be discharged to:: Private residence Living Arrangements: Spouse/significant other;Children (grandson) Available Help at  Discharge: Family;Available 24 hours/day Type of Home: House Home Access: Stairs to enter [employer] of Steps: 5 Entrance Stairs-Rails: Left Home Layout: One level     Bathroom Shower/Tub: Tub/shower unit         Home Equipment: Shower seat   Additional Comments: Husband is 52 but grandson does not work      Prior Functioning/Environment Level of Independence: Independent with assistive device(s)        Comments: Grandson does shopping, MOD I for ADLs using shower chair, no AD but endorses furniture walking        OT Problem List: Decreased strength;Decreased activity tolerance;Impaired balance (sitting and/or standing)      OT Treatment/Interventions: Self-care/ADL training;Therapeutic exercise;Energy conservation;DME and/or AE instruction;Therapeutic activities;Patient/family education;Balance training    OT Goals(Current goals can be found in the care plan section) Acute Rehab OT Goals Patient Stated Goal: To return home OT Goal Formulation: With patient Time For Goal Achievement: 04/07/20 Potential to Achieve Goals: Good ADL Goals Pt Will Perform Grooming: with modified independence;standing (c LRAD PRN) Pt Will Perform Lower Body Dressing: with modified independence;sit to/from stand (c LRAD PRN) Pt Will Transfer to Toilet: with modified independence;ambulating;regular height toilet (c LRAD PRN)  OT Frequency: Min 1X/week           Co-evaluation PT/OT/SLP Co-Evaluation/Treatment: Yes Reason for Co-Treatment: For patient/therapist safety;To address functional/ADL transfers PT goals addressed during session: Mobility/safety with mobility;Proper use of DME OT goals addressed during session: ADL's and self-care      AM-PAC OT "6 Clicks" Daily Activity     Outcome Measure Help from another person eating meals?: None Help from another person taking care of personal grooming?: A Little Help from another person toileting, which includes using toliet,  bedpan, or urinal?: A Little Help from another person bathing (including washing, rinsing, drying)?: A Little Help from another person to put on and taking off regular upper body clothing?: A Little Help from another person to put on and taking off regular lower body clothing?: A Little 6 Click Score: 19   End of Session Equipment Utilized During Treatment: Rolling walker;Oxygen Nurse Communication: Mobility status  Activity Tolerance: Patient tolerated treatment well Patient left: in bed;with call bell/phone within reach;with bed alarm set  OT Visit Diagnosis: Other abnormalities of gait and mobility (R26.89);Muscle weakness (generalized) (M62.81)                Time: 9381-0175 OT Time Calculation (min): 31 min Charges:  OT General Charges $OT Visit: 1 Visit OT Evaluation $OT Eval Moderate Complexity: 1 Mod OT Treatments $Self Care/Home Management : 8-22 mins  Dessie Coma, M.S. OTR/L  03/24/20, 10:25 AM  ascom [phone]

## 2020-03-24 NOTE — Consult Note (Signed)
PHARMACY CONSULT NOTE - FOLLOW UP  Pharmacy Consult for Electrolyte Monitoring and Replacement   Recent Labs: Potassium (mmol/L)  Date Value  03/24/2020 4.6   Magnesium (mg/dL)  Date Value  03/24/2020 2.1   Calcium (mg/dL)  Date Value  03/24/2020 8.8 (L)   Albumin (g/dL)  Date Value  03/23/2020 2.5 (L)  07/31/2019 3.9   Phosphorus (mg/dL)  Date Value  03/24/2020 3.9   Sodium (mmol/L)  Date Value  03/24/2020 143  07/31/2019 137   Corrected Ca: 10  Assessment: 87 yo F presented with acute respiratory failure likely due to pulmonary edema. Pt on furosemide 80 mg BID, continuing with worsening renal function due to worsening edema on CXR; Scr slightly improved 1.89>2.40>2.21. Pt with low hemoglobin 7.3>7.6, unable to transfuse for religious reasons. Nephrology consulted for worsening metabolic acidosis and anemia in setting of CKD with elevated serum creatinine.    Goal of Therapy:  Electrolytes WNL  Plan:  --All electrolytes WNL, no electrolyte replacement needed at this time --KCl 20 mEq BID discontinued 3/9 due to K 4.0>4.9. 3/10: 4.6 - continue to monitor --Follow-up electrolytes with AM labs tomorrow  Benn Moulder, PharmD Pharmacy Resident  03/24/2020 11:31 AM

## 2020-03-24 NOTE — Evaluation (Signed)
Physical Therapy Evaluation Patient Details Name: Sheri Ryan MRN: [id number] DOB: 30-Aug-1929 Today's Date: 03/24/2020   History of Present Illness  87 yo AAF with Severe ACUTE Hypoxic and Hypercapnic Respiratory Failure due to bilateral infiltrates with acute bacterial/viral pneumonia with findings of acute S CHF exacerbation with acute on chronic renal failure with acidosis and DKA. PMH of Blind left eye, Cataract, Chronic kidney disease, Diabetes mellitus without complication ([place]), Hypertension, and Retinopathy.  Clinical Impression  PT evaluation completed. Patient is eager to participate with PT and mobilize. Patient is previously independent with activity with reported gradual decline with activity tolerance over the past month. Patient was on heated HFNC during session with Sp02 98-100% with activity. Patient was able to stand and walk a short distance with rolling walker with minimal assistance. No dizziness reported with upright activity. Standing tolerance of ~ 5 minutes with UE supported on rolling walker and good standing balance demonstrated. Recommend PT follow up to maximize independence and address functional limitations listed below in preparation for discharge home. Based on current functional status and patient's goal to return home, anticipate patient will be able to discharge home with HHPT and intermittent support from family.     Follow Up Recommendations Home health PT;Supervision - Intermittent    Equipment Recommendations  Rolling walker with 5" wheels    Recommendations for Other Services       Precautions / Restrictions Precautions Precautions: Fall Restrictions Weight Bearing Restrictions: No      Mobility  Bed Mobility Overal bed mobility: Needs Assistance Bed Mobility: Supine to Sit;Sit to Supine     Supine to sit: Min assist Sit to supine: Min assist        Transfers Overall transfer level: Needs assistance Equipment used: Rolling  walker (2 wheeled) Transfers: Sit to/from Stand Sit to Stand: Min assist         General transfer comment: lift off assistance required for standing multiple bouts. verbal cues for sequencing and technique  Ambulation/Gait Ambulation/Gait assistance: Min assist Gait Distance (Feet): 5 Feet Assistive device: Rolling walker (2 wheeled) Gait Pattern/deviations: Narrow base of support Gait velocity: decreased   General Gait Details: gait distance limited by HHFNC. patient required minimal assistance for steadying and cues for technique using rolling walker. Sp02 98-100% with standing activity.  Stairs            Wheelchair Mobility    Modified Rankin (Stroke Patients Only)       Balance Overall balance assessment: Needs assistance Sitting-balance support: No upper extremity supported;Feet supported Sitting balance-Leahy Scale: Fair     Standing balance support: Bilateral upper extremity supported Standing balance-Leahy Scale: Good Standing balance comment: with rolling walker for UE support. standing tolerance ~ 5 minutes                             Pertinent Vitals/Pain Pain Assessment: No/denies pain    Home Living Family/patient expects to be discharged to:: Private residence Living Arrangements: Spouse/significant other;Children Available Help at Discharge: Family;Available 24 hours/day Type of Home: House Home Access: Stairs to enter Entrance Stairs-Rails: Left Entrance Stairs-Number of Steps: 5 Home Layout: One level Home Equipment: Shower seat Additional Comments: Husband is 69 but grandson does not work    Prior Function Level of Independence: Independent         Comments: patient reports progressive decline in activity level over the past month but no physical assistance is required for mobility at  baseline     Hand Dominance   Dominant Hand: Right    Extremity/Trunk Assessment   Upper Extremity Assessment Upper Extremity  Assessment: LUE deficits/detail LUE Deficits / Details: baseline limited shoulder movement. see OT note for details    Lower Extremity Assessment Lower Extremity Assessment: Generalized weakness       Communication   Communication: HOH  Cognition Arousal/Alertness: Awake/alert Behavior During Therapy: WFL for tasks assessed/performed Overall Cognitive Status: Within Functional Limits for tasks assessed                                 General Comments: patient able to follow commands without difficulty      General Comments General comments (skin integrity, edema, etc.): returned to room after PT evaluation for family education per family request. family had multiple questions about discharge plans, therapy plan of care, what to expect from a mobility stand point, role of PT and OT while in the hospital, and dishcarge recommendations. education provided for 10 minutes in the room with patient and family.    Exercises Other Exercises Other Exercises: Pt educated re: OT role, DME recs, d/c recs, falls prevention, HEP, ECS Other Exercises: LBD, sup<>sit, sit<>stand, sitting/standing balance/tolerance   Assessment/Plan    PT Assessment Patient needs continued PT services  PT Problem List Decreased strength;Decreased activity tolerance;Decreased mobility;Decreased balance;Decreased safety awareness;Cardiopulmonary status limiting activity       PT Treatment Interventions DME instruction;Gait training;Stair training;Functional mobility training;Therapeutic activities;Therapeutic exercise;Neuromuscular re-education;Balance training;Cognitive remediation;Patient/family education    PT Goals (Current goals can be found in the Care Plan section)  Acute Rehab PT Goals Patient Stated Goal: to go home PT Goal Formulation: With patient Time For Goal Achievement: 04/07/20 Potential to Achieve Goals: Good    Frequency Min 2X/week   Barriers to discharge         Co-evaluation PT/OT/SLP Co-Evaluation/Treatment: Yes Reason for Co-Treatment: For patient/therapist safety;To address functional/ADL transfers PT goals addressed during session: Mobility/safety with mobility OT goals addressed during session: ADL's and self-care       AM-PAC PT "6 Clicks" Mobility  Outcome Measure Help needed turning from your back to your side while in a flat bed without using bedrails?: A Little Help needed moving from lying on your back to sitting on the side of a flat bed without using bedrails?: A Little Help needed moving to and from a bed to a chair (including a wheelchair)?: A Little Help needed standing up from a chair using your arms (e.g., wheelchair or bedside chair)?: A Little Help needed to walk in hospital room?: A Little Help needed climbing 3-5 steps with a railing? : A Lot 6 Click Score: 17    End of Session Equipment Utilized During Treatment: Oxygen Activity Tolerance: Patient tolerated treatment well Patient left: in bed;with call bell/phone within reach Nurse Communication: Mobility status PT Visit Diagnosis: Muscle weakness (generalized) (M62.81);Unsteadiness on feet (R26.81)    Time: 3710-6269 PT Time Calculation (min) (ACUTE ONLY): 41 min   Charges:   PT Evaluation $PT Eval Moderate Complexity: 1 Mod PT Treatments $Therapeutic Activity: 23-37 mins        Minna Merritts, PT, MPT   Percell Locus 03/24/2020, 12:48 PM

## 2020-03-24 NOTE — Progress Notes (Signed)
[facility]  ROUNDING NOTE   Subjective:   [name] was admitted to [facility] on 03/19/2020 for Respiratory failure [facility]) [J96.90] Acute pulmonary edema ([place]) [J81.0] Acute respiratory failure with hypoxia ([place]) [J96.01] Diabetic ketoacidosis without coma associated with type 2 diabetes mellitus ([place]) [E11.10] Anemia, unspecified type [D64.9]  UOP 2661mL Creatinine 2.21 (2.4)  Sons at bedside.   Objective:  Vital signs in last 24 hours:  Temp:  [98 F (36.7 C)-99.3 F (37.4 C)] 98.9 F (37.2 C) (03/10 0800) Pulse Rate:  [76-91] 78 (03/10 0700) Resp:  [14-35] 22 (03/10 0800) BP: (137-172)/(63-76) 172/71 (03/10 0800) SpO2:  [90 %-100 %] 99 % (03/10 0800) FiO2 (%):  [50 %] 50 % (03/10 0820)  Weight change:  Filed Weights   03/20/20 0500 03/21/20 0500 03/22/20 0500  Weight: 75 kg 73.8 kg 74 kg    Intake/Output: I/O last 3 completed shifts: In: 1963.1 [P.O.:1607; I.V.:151.1; IV Piggyback:205] Out: 4235 [Urine:3165]   Intake/Output this shift:  No intake/output data recorded.  Physical Exam: General: Critically ill  Head: HFNC+ +hard of hearing  Eyes: Anicteric, PERRL  Neck: Supple, trachea midline  Lungs:  Bilateral crackles  Heart: Regular rate and rhythm  Abdomen:  Soft, nontender  Extremities: no peripheral edema.  Neurologic: Nonfocal, moving all four extremities  Skin: No lesions  GU: Foley with urine    Basic Metabolic Panel: Recent Labs  Lab 03/19/20 0814 03/19/20 1609 03/20/20 0500 03/21/20 0622 03/22/20 0502 03/22/20 1121 03/23/20 0705 03/24/20 0420  NA 137   < > 140 141 143 145 143 143  K 4.0   < > 4.6 4.0 3.4* 4.0 4.9 4.6  CL 105   < > 109 109 110 110 113* 106  CO2 15*   < > 19* 23 20* 23 16* 27  GLUCOSE 375*   < > 123* 101* 129* 122* 128* 87  BUN 27*   < > 30* 31* 36* 38* 42* 41*  CREATININE 1.72*   < > 1.68* 1.85* 2.01* 1.89* 2.40* 2.21*  CALCIUM 8.3*   < > 8.7* 8.5* 8.3* 8.7* 8.7* 8.8*  MG 2.2  --  2.1 2.0  2.0  --  2.1 2.1  PHOS 4.0  --  4.7* 4.4 4.1  --   --  3.9   < > = values in this interval not displayed.    Liver Function Tests: Recent Labs  Lab 03/19/20 0814 03/23/20 0705  AST 33  --   ALT 30  --   ALKPHOS 85  --   BILITOT 0.8  --   PROT 7.6  --   ALBUMIN 3.1* 2.5*   No results for input(s): LIPASE, AMYLASE in the last 168 hours. No results for input(s): AMMONIA in the last 168 hours.  CBC: Recent Labs  Lab 03/19/20 0814 03/20/20 0500 03/21/20 0622 03/22/20 0502 03/23/20 0705  WBC 15.6* 16.5* 15.3* 13.3* 13.5*  HGB 8.8* 8.2* 7.9* 7.3* 7.6*  HCT 27.5* 25.6* 24.8* 21.8* 22.6*  MCV 80.4 81.5 79.7* 77.0* 77.4*  PLT 330 332 360 352 324    Cardiac Enzymes: No results for input(s): CKTOTAL, CKMB, CKMBINDEX, TROPONINI in the last 168 hours.  BNP: Invalid input(s): POCBNP  CBG: Recent Labs  Lab 03/23/20 1510 03/23/20 1915 03/23/20 2310 03/24/20 0318 03/24/20 0715  GLUCAP 125* 109* 93 88 89    Microbiology: Results for orders placed or performed during the hospital encounter of 03/19/20  Blood culture (routine x 2)  Status: None   Collection Time: 03/19/20  8:14 AM   Specimen: BLOOD  Result Value Ref Range Status   Specimen Description BLOOD RIGHT FOREARM  Final   Special Requests   Final    BOTTLES DRAWN AEROBIC AND ANAEROBIC Blood Culture results may not be optimal due to an excessive volume of blood received in culture bottles   Culture   Final    NO GROWTH 5 DAYS Performed at [facility], [address] [place], [place] [address]    Report Status 03/24/2020 FINAL  Final  Blood culture (routine x 2)     Status: None   Collection Time: 03/19/20  8:14 AM   Specimen: BLOOD  Result Value Ref Range Status   Specimen Description BLOOD RIGHT HAND  Final   Special Requests   Final    BOTTLES DRAWN AEROBIC AND ANAEROBIC Blood Culture adequate volume   Culture   Final    NO GROWTH 5 DAYS Performed at [facility], [address] [place], [place] [address]    Report Status 03/24/2020 FINAL  Final  Resp Panel by RT-PCR (Flu A&B, Covid) Nasopharyngeal Swab     Status: None   Collection Time: 03/19/20  8:14 AM   Specimen: Nasopharyngeal Swab; Nasopharyngeal(NP) swabs in vial transport medium  Result Value Ref Range Status   SARS Coronavirus 2 by RT PCR NEGATIVE NEGATIVE Final    Comment: (NOTE) SARS-CoV-2 target nucleic acids are NOT DETECTED.  The SARS-CoV-2 RNA is generally detectable in upper respiratory specimens during the acute phase of infection. The lowest concentration of SARS-CoV-2 viral copies this assay can detect is 138 copies/mL. A negative result does not preclude SARS-Cov-2 infection and should not be used as the sole basis for treatment or other patient management decisions. A negative result may occur with  improper specimen collection/handling, submission of specimen other than nasopharyngeal swab, presence of viral mutation(s) within the areas targeted by this assay, and inadequate number of viral copies(<138 copies/mL). A negative result must be combined with clinical observations, patient history, and epidemiological information. The expected result is Negative.  Fact Sheet for Patients:  EntrepreneurPulse.com.au  Fact Sheet for Healthcare Providers:  IncredibleEmployment.be  This test is no t yet approved or cleared by the [place] FDA and  has been authorized for detection and/or diagnosis of SARS-CoV-2 by FDA under an Emergency Use Authorization (EUA). This EUA will remain  in effect (meaning this test can be used) for the duration of the COVID-19 declaration under Section 564(b)(1) of the Act, 21 U.S.C.section 360bbb-3(b)(1), unless the authorization is terminated  or revoked sooner.       Influenza A by PCR NEGATIVE NEGATIVE Final   Influenza B by PCR NEGATIVE NEGATIVE Final    Comment: (NOTE) The Xpert Xpress SARS-CoV-2/FLU/RSV  plus assay is intended as an aid in the diagnosis of influenza from Nasopharyngeal swab specimens and should not be used as a sole basis for treatment. Nasal washings and aspirates are unacceptable for Xpert Xpress SARS-CoV-2/FLU/RSV testing.  Fact Sheet for Patients: EntrepreneurPulse.com.au  Fact Sheet for Healthcare Providers: IncredibleEmployment.be  This test is not yet approved or cleared by the [place] FDA and has been authorized for detection and/or diagnosis of SARS-CoV-2 by FDA under an Emergency Use Authorization (EUA). This EUA will remain in effect (meaning this test can be used) for the duration of the COVID-19 declaration under Section 564(b)(1) of the Act, 21 U.S.C. section 360bbb-3(b)(1), unless the authorization is terminated or revoked.  Performed at [facility], [address] [place], [place] [address]   MRSA PCR Screening     Status: None   Collection Time: 03/19/20 11:17 AM   Specimen: Nasopharyngeal  Result Value Ref Range Status   MRSA by PCR NEGATIVE NEGATIVE Final    Comment:        The GeneXpert MRSA Assay (FDA approved for NASAL specimens only), is one component of a comprehensive MRSA colonization surveillance program. It is not intended to diagnose MRSA infection nor to guide or monitor treatment for MRSA infections. Performed at [facility], [address] [place], [place] [address]     Coagulation Studies: No results for input(s): LABPROT, INR in the last 72 hours.  Urinalysis: Recent Labs    03/23/20 1530  COLORURINE YELLOW*  LABSPEC 1.008  PHURINE 5.0  GLUCOSEU NEGATIVE  HGBUR MODERATE*  BILIRUBINUR NEGATIVE  KETONESUR NEGATIVE  PROTEINUR 100*  NITRITE NEGATIVE  LEUKOCYTESUR LARGE*      Imaging: US RENAL  Result Date: 03/23/2020 CLINICAL DATA:  Acute kidney injury EXAM: RENAL / URINARY TRACT ULTRASOUND COMPLETE COMPARISON:  February 04, 2020 FINDINGS:  Right Kidney: Renal measurements: 8.5 x 3.9 x 4.8 cm = volume: 83 mL. There is no hydronephrosis. The cortical echogenicity is slightly increased. Left Kidney: Renal measurements: 8.1 x 4.4 x 3.5 cm = volume: 65 mL. There is no hydronephrosis. The cortical echogenicity is slightly increased. There is a cyst in the upper pole measuring 2.7 cm. Bladder: The bladder is decompressed by Foley catheter. Other: Incidentally noted is a left-sided pleural effusion. IMPRESSION: 1. No hydronephrosis. 2. Echogenic kidneys bilaterally which can be seen in patients with medical renal disease. The bladder was not well evaluated secondary to the presence of a Foley catheter. 3. Incidentally noted left-sided pleural effusion. Electronically Signed   By: Constance Holster M.D.   On: 03/23/2020 19:48   DG Chest Port 1 View  Result Date: 03/23/2020 CLINICAL DATA:  Pulmonary edema. EXAM: PORTABLE CHEST 1 VIEW COMPARISON:  03/21/2020. FINDINGS: Cardiomegaly again noted. Diffuse severe bilateral pulmonary infiltrates/edema again noted. Low lung volumes. Small bilateral pleural effusions cannot be excluded. No pneumothorax. Degenerative change thoracic spine. IMPRESSION: 1. Cardiomegaly again noted. 2. Diffuse severe bilateral pulmonary infiltrates/edema again noted. Small bilateral pleural effusions cannot be excluded. Electronically Signed   By: Marcello Moores  Register   On: 03/23/2020 05:57     Medications:   . sodium chloride Stopped (03/20/20 1603)   . [START ON 03/25/2020] amLODipine  10 mg Oral Daily  . amLODipine  5 mg Oral Once  . Chlorhexidine Gluconate Cloth  6 each Topical Q0600  . famotidine  20 mg Oral Daily  . furosemide  80 mg Intravenous BID  . heparin  5,000 Units Subcutaneous Q8H  . insulin aspart  0-20 Units Subcutaneous Q4H  . insulin glargine  10 Units Subcutaneous QHS  . mouth rinse  15 mL Mouth Rinse BID  . nebivolol  10 mg Oral Daily  . sodium chloride flush  3 mL Intravenous Q12H   sodium chloride,  acetaminophen, dextrose, docusate sodium, hydrALAZINE, midazolam, morphine injection, ondansetron (ZOFRAN) IV, polyethylene glycol, sodium chloride flush  Assessment/ Plan:  [name] is a 87 y.o. black Jehovah witness female with diabetes mellitus type II, diabetic retinopathy, hypertension, left eye blindness, congestive heart failure, who is admitted to [facility] on 03/19/2020 for Respiratory failure ([place]) [J96.90] Acute pulmonary edema ([place]) [J81.0] Acute respiratory failure with hypoxia ([place]) [J96.01] Diabetic ketoacidosis without coma associated  with type 2 diabetes mellitus (H[facility]) [E11.10] Anemia, unspecified type [D64.9]  1. Acute kidney injury on chronic kidney disease stage IIIB with nephrotic range proteinuria. (>4.4 grams) Baseline creatinine of 1.8, GFR of 32 on 02/19/2020.  Chronic kidney disease thought to be secondary to diabetic nephropathy although serologic work up not complete.  ANA negative. SPEP/UPEP negative.  Holding home lisinopril dose.  - Pending serologic studies - may need a kidney biopsy when more stable.   2. Acute exacerbation of congestive heart failure: echocardiogram on 3/6 with preserved systolic function.  - IV furosemide - low salt diet - fluid restriction  3. Hypertension: home regimen of lisinopril, amlodipine and nebivolol.  - IV furosemide as above - Continue nebivolol and amlodipine.   4. Anemia with kidney failure: hemoglobin 7.6 microcytic. With iron deficiency. 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  - IV iron.   5. Diabetes mellitus type II with chronic kidney disease: noninsulin dependent at baseline. Currently on insulin glargine and sliding scale. Hemoglobin A1c of 7.5% on 02/19/20.  Not currently on an SGLT-2 inhibitor.    LOS: 5 Sheri Ryan 3/10/202210:07 AM

## 2020-03-24 NOTE — Progress Notes (Signed)
87 y/o F w/ PMH of CKD, DM, HTN, blindness of left eye who was admitted for CHF exacerbation. Hospitalist service will take over care tomorrow.  This a non-billable note.

## 2020-03-24 NOTE — Consult Note (Signed)
PHARMACY CONSULT NOTE - FOLLOW UP  Pharmacy Consult for Electrolyte Monitoring and Replacement   Recent Labs: Potassium (mmol/L)  Date Value  03/24/2020 4.6   Magnesium (mg/dL)  Date Value  03/24/2020 2.1   Calcium (mg/dL)  Date Value  03/24/2020 8.8 (L)   Albumin (g/dL)  Date Value  03/23/2020 2.5 (L)  07/31/2019 3.9   Phosphorus (mg/dL)  Date Value  03/24/2020 3.9   Sodium (mmol/L)  Date Value  03/24/2020 143  07/31/2019 137    Assessment: 87 yo F presented with acute respiratory failure likely due to pulmonary edema. Pt on furosemide 80 mg BID, continuing with worsening renal function due to worsening edema on CXR. Pt with low hemoglobin, unable to transfuse for religious reasons. Nephrology consulted for worsening metabolic acidosis and anemia in setting of CKD with elevated serum creatinine.    Goal of Therapy:  Electrolytes WNL  Plan:  --All electrolytes WNL, no electrolyte replacement needed at this time --Follow-up electrolytes with AM labs tomorrow  [name], PharmD 03/24/2020 8:58 AM

## 2020-03-24 NOTE — TOC Progression Note (Signed)
Transition of Care [facility]) - Progression Note    Patient Details  Name: Sheri Ryan MRN: [id number] Date of Birth: June 27, 1929  Transition of Care [facility]) CM/SW [place], [place] Phone Number: [phone] 03/24/2020, 3:44 PM  Clinical Narrative:     CSW spoke with patient and patient's spouse Mr. Paradiso and son [name] with an update on PT/OT recommendations.  CSW stated PT/OT recommended home health and explained the process of finding an agency.  Mr. And Ms. Boltz did not have a choice of home health agency.  CSW contacted [facility] and they will accept the patient for PT/OT.    Expected Discharge Plan: [place] Barriers to Discharge: Continued Medical Work up  Expected Discharge Plan and Services Expected Discharge Plan: [place] In-house Referral: Clinical Social Work   Post Acute Care Choice: [place] arrangements for the past 2 months: Single Family Home                                       Social Determinants of Health (SDOH) Interventions    Readmission Risk Interventions No flowsheet data found.

## 2020-03-24 NOTE — TOC Progression Note (Addendum)
Transition of Care [facility]) - Progression Note    Patient Details  Name: Sheri Ryan MRN: [id number] Date of Birth: Sep 22, 1929  Transition of Care [facility]) CM/SW [place], [place] Phone Number: [phone] 03/24/2020, 10:49 AM  Clinical Narrative:     CSWspoke with patient and her son about the difference between home health and SNF placement.  Patient does not want SNF placement. Patient stated her main concern is the need for assistance throughout the day.  CSW stated insurance would not pay all day care, and she would need to contact a private care giver organization Patient and don verbalized understanding.     Expected Discharge Plan: [place] Barriers to Discharge: Continued Medical Work up  Expected Discharge Plan and Services Expected Discharge Plan: [place] In-house Referral: Clinical Social Work   Post Acute Care Choice: [place] arrangements for the past 2 months: Single Family Home                                       Social Determinants of Health (SDOH) Interventions    Readmission Risk Interventions No flowsheet data found.

## 2020-03-24 NOTE — Progress Notes (Signed)
Progress Note  Patient Name: Sheri Ryan Date of Encounter: 03/24/2020  [facility] Cardiologist: Dr. Fletcher Anon, [facility]  Subjective   No complaints, standing by the bedside working with OT Oxygen saturation 99 up to 100%, on high flow nasal cannula Still has some abdominal soreness, denies leg swelling -4 L this admission   Inpatient Medications    Scheduled Meds: . amLODipine  5 mg Oral Daily  . Chlorhexidine Gluconate Cloth  6 each Topical Q0600  . famotidine  20 mg Oral Daily  . furosemide  80 mg Intravenous BID  . heparin  5,000 Units Subcutaneous Q8H  . insulin aspart  0-20 Units Subcutaneous Q4H  . insulin glargine  10 Units Subcutaneous QHS  . mouth rinse  15 mL Mouth Rinse BID  . sodium chloride flush  3 mL Intravenous Q12H   Continuous Infusions: . sodium chloride Stopped (03/20/20 1603)   PRN Meds: sodium chloride, acetaminophen, dextrose, docusate sodium, hydrALAZINE, midazolam, morphine injection, ondansetron (ZOFRAN) IV, polyethylene glycol, sodium chloride flush   Vital Signs    Vitals:   03/24/20 0500 03/24/20 0600 03/24/20 0700 03/24/20 0800  BP: (!) 165/73 (!) 156/68 (!) 163/64 (!) 172/71  Pulse: 80 76 78   Resp: 17 14 (!) 21 (!) 22  Temp:    98.9 F (37.2 C)  TempSrc:    Axillary  SpO2: 98% 99% 100% 99%  Weight:      Height:        Intake/Output Summary (Last 24 hours) at 03/24/2020 0929 Last data filed at 03/24/2020 0549 Gross per 24 hour  Intake 1559.1 ml  Output 2560 ml  Net -1000.9 ml   Last 3 Weights 03/22/2020 03/21/2020 03/20/2020  Weight (lbs) 163 lb 2.3 oz 162 lb 11.2 oz 165 lb 5.5 oz  Weight (kg) 74 kg 73.8 kg 75 kg      Telemetry    NSWR - Personally Reviewed  ECG     - Personally Reviewed  Physical Exam   GEN: No acute distress.  Standing, working with OT Neck:  Unable to estimate JVD Cardiac: RRR, no murmurs, rubs, or gallops.  Respiratory: Clear to auscultation bilaterally. GI: Soft, nontender,  non-distended  MS: No edema; No deformity. Neuro:  Nonfocal  Psych: Normal affect   Labs    High Sensitivity Troponin:   Recent Labs  Lab 03/19/20 0814 03/21/20 0622 03/21/20 1113  TROPONINIHS 34* 491* 412*      Chemistry Recent Labs  Lab 03/19/20 0814 03/19/20 1609 03/22/20 1121 03/23/20 0705 03/24/20 0420  NA 137   < > 145 143 143  K 4.0   < > 4.0 4.9 4.6  CL 105   < > 110 113* 106  CO2 15*   < > 23 16* 27  GLUCOSE 375*   < > 122* 128* 87  BUN 27*   < > 38* 42* 41*  CREATININE 1.72*   < > 1.89* 2.40* 2.21*  CALCIUM 8.3*   < > 8.7* 8.7* 8.8*  PROT 7.6  --   --   --   --   ALBUMIN 3.1*  --   --  2.5*  --   AST 33  --   --   --   --   ALT 30  --   --   --   --   ALKPHOS 85  --   --   --   --   BILITOT 0.8  --   --   --   --  GFRNONAA 28*   < > 25* 19* 21*  ANIONGAP 17*   < > 12 14 10    < > = values in this interval not displayed.     Hematology Recent Labs  Lab 03/21/20 0622 03/22/20 0502 03/22/20 1121 03/23/20 0705  WBC 15.3* 13.3*  --  13.5*  RBC 3.11* 2.83* 2.91* 2.92*  HGB 7.9* 7.3*  --  7.6*  HCT 24.8* 21.8*  --  22.6*  MCV 79.7* 77.0*  --  77.4*  MCH 25.4* 25.8*  --  26.0  MCHC 31.9 33.5  --  33.6  RDW 15.9* 15.7*  --  15.9*  PLT 360 352  --  324    BNP Recent Labs  Lab 03/21/20 0622 03/21/20 1953 03/22/20 0502  BNP 1,699.8* 1,544.3* 1,070.4*     DDimer No results for input(s): DDIMER in the last 168 hours.   Radiology    US RENAL  Result Date: 03/23/2020 CLINICAL DATA:  Acute kidney injury EXAM: RENAL / URINARY TRACT ULTRASOUND COMPLETE COMPARISON:  February 04, 2020 FINDINGS: Right Kidney: Renal measurements: 8.5 x 3.9 x 4.8 cm = volume: 83 mL. There is no hydronephrosis. The cortical echogenicity is slightly increased. Left Kidney: Renal measurements: 8.1 x 4.4 x 3.5 cm = volume: 65 mL. There is no hydronephrosis. The cortical echogenicity is slightly increased. There is a cyst in the upper pole measuring 2.7 cm. Bladder: The bladder  is decompressed by Foley catheter. Other: Incidentally noted is a left-sided pleural effusion. IMPRESSION: 1. No hydronephrosis. 2. Echogenic kidneys bilaterally which can be seen in patients with medical renal disease. The bladder was not well evaluated secondary to the presence of a Foley catheter. 3. Incidentally noted left-sided pleural effusion. Electronically Signed   By: Constance Holster M.D.   On: 03/23/2020 19:48   DG Chest Port 1 View  Result Date: 03/23/2020 CLINICAL DATA:  Pulmonary edema. EXAM: PORTABLE CHEST 1 VIEW COMPARISON:  03/21/2020. FINDINGS: Cardiomegaly again noted. Diffuse severe bilateral pulmonary infiltrates/edema again noted. Low lung volumes. Small bilateral pleural effusions cannot be excluded. No pneumothorax. Degenerative change thoracic spine. IMPRESSION: 1. Cardiomegaly again noted. 2. Diffuse severe bilateral pulmonary infiltrates/edema again noted. Small bilateral pleural effusions cannot be excluded. Electronically Signed   By: Marcello Moores  Register   On: 03/23/2020 05:57    Cardiac Studies   Echo 1. Left ventricular ejection fraction, by estimation, is 55 to 60%. The  left ventricle has normal function. Left ventricular endocardial border  not optimally defined to evaluate regional wall motion. There is mild left  ventricular hypertrophy. Left  ventricular diastolic parameters are indeterminate.  2. Right ventricular systolic function is normal. The right ventricular  size is normal. There is moderately elevated pulmonary artery systolic  pressure. The estimated right ventricular systolic pressure is 83.4 mmHg.  3. Left atrial size was mildly dilated.  4. The mitral valve is normal in structure. Moderate mitral valve  regurgitation. No evidence of mitral stenosis.  5. The aortic valve is normal in structure. Aortic valve regurgitation is  not visualized. Mild to moderate aortic valve sclerosis/calcification is  present, without any evidence of aortic  stenosis.   Patient Profile     87 y.o. female with history of CKD stage III, DM, HTN, HLD, anemia, and cervical radiculopathywho is a Jehovah's witness andis being seen today for the evaluation ofpulmonary edema  Assessment & Plan   Acute hypoxic respiratory failure Diastolic heart failure, pulmonary edema in the setting of poorly controlled hypertension and anemia,  renal failure with --Slow improvement, 4 L negative Still with abdominal fullness -Continue Lasix 80 IV twice daily, renal function stable -Suspect oxygen can be weaned  Pulmonary hypertension/diastolic CHF Moderately elevated right heart pressures on echo Symptoms exacerbated by anemia, low albumin Continue IV Lasix twice daily as of  Demand ischemia Elevated troponin in the setting of hypoxic respiratory failure No plan for ischemic work-up at this time Not a good candidate in the setting of advanced age, anemia, family to accept blood products  Anemia Iron deficiency, Iron infusion, May need EPO in the setting of renal failure  Essential hypertension Still poorly controlled, likely contributing to CHF symptoms above As outpatient she is taking lisinopril with bystolic and amlodipine Will increase amlodipine up to outpatient dosing 10 mg daily, restart bystolic 10 daily (as outpatient was taking 10 mg twice daily) -Additional changes will likely be needed   Long discussion with her concerning etiology of her heart failure symptoms including anemia, renal failure, hypertension, diastolic dysfunction  Total encounter time more than 35 minutes  Greater than 50% was spent in counseling and coordination of care with the patient    For questions or updates, please contact [facility] Please consult [email or web address] for contact info under        Signed, Ida Rogue, MD  03/24/2020, 9:29 AM
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
## 2020-03-25 DIAGNOSIS — I5033 Acute on chronic diastolic (congestive) heart failure: Secondary | ICD-10-CM

## 2020-03-25 DIAGNOSIS — I272 Pulmonary hypertension, unspecified: Secondary | ICD-10-CM

## 2020-03-25 DIAGNOSIS — J9601 Acute respiratory failure with hypoxia: Secondary | ICD-10-CM | POA: Diagnosis not present

## 2020-03-25 LAB — HEMOGLOBIN A1C
Hgb A1c MFr Bld: 7.1 % — ABNORMAL HIGH (ref 4.8–5.6)
Mean Plasma Glucose: 157.07 mg/dL

## 2020-03-25 LAB — CBC
HCT: 27 % — ABNORMAL LOW (ref 36.0–46.0)
Hemoglobin: 8.5 g/dL — ABNORMAL LOW (ref 12.0–15.0)
MCH: 25.2 pg — ABNORMAL LOW (ref 26.0–34.0)
MCHC: 31.5 g/dL (ref 30.0–36.0)
MCV: 80.1 fL (ref 80.0–100.0)
Platelets: 400 10*3/uL (ref 150–400)
RBC: 3.37 MIL/uL — ABNORMAL LOW (ref 3.87–5.11)
RDW: 15.8 % — ABNORMAL HIGH (ref 11.5–15.5)
WBC: 10.4 10*3/uL (ref 4.0–10.5)
nRBC: 0.3 % — ABNORMAL HIGH (ref 0.0–0.2)

## 2020-03-25 LAB — BASIC METABOLIC PANEL
Anion gap: 10 (ref 5–15)
BUN: 46 mg/dL — ABNORMAL HIGH (ref 8–23)
CO2: 27 mmol/L (ref 22–32)
Calcium: 8.5 mg/dL — ABNORMAL LOW (ref 8.9–10.3)
Chloride: 101 mmol/L (ref 98–111)
Creatinine, Ser: 2.26 mg/dL — ABNORMAL HIGH (ref 0.44–1.00)
GFR, Estimated: 20 mL/min — ABNORMAL LOW (ref >60)
Glucose, Bld: 124 mg/dL — ABNORMAL HIGH (ref 70–99)
Potassium: 3.1 mmol/L — ABNORMAL LOW (ref 3.5–5.1)
Sodium: 138 mmol/L (ref 135–145)

## 2020-03-25 LAB — GLUCOSE, CAPILLARY
Glucose-Capillary: 66 mg/dL — ABNORMAL LOW (ref 70–99)
Glucose-Capillary: 130 mg/dL — ABNORMAL HIGH (ref 70–99)
Glucose-Capillary: 99 mg/dL (ref 70–99)
Glucose-Capillary: 133 mg/dL — ABNORMAL HIGH (ref 70–99)
Glucose-Capillary: 204 mg/dL — ABNORMAL HIGH (ref 70–99)
Glucose-Capillary: 154 mg/dL — ABNORMAL HIGH (ref 70–99)

## 2020-03-25 LAB — MAGNESIUM: Magnesium: 1.8 mg/dL (ref 1.7–2.4)

## 2020-03-25 MED ORDER — HYDRALAZINE HCL 25 MG PO TABS
25.0000 mg | ORAL_TABLET | Freq: Three times a day (TID) | ORAL | Status: DC
  Administered 2020-03-25 – 2020-03-29 (×14): 25 mg via ORAL
  Filled 2020-03-25 (×15): qty 1

## 2020-03-25 MED ORDER — INSULIN ASPART 100 UNIT/ML ~~LOC~~ SOLN
0.0000 [IU] | SUBCUTANEOUS | Status: DC
  Administered 2020-03-25: 5 [IU] via SUBCUTANEOUS
  Administered 2020-03-25: 3 [IU] via SUBCUTANEOUS
  Administered 2020-03-25 (×2): 2 [IU] via SUBCUTANEOUS
  Administered 2020-03-26: 5 [IU] via SUBCUTANEOUS
  Administered 2020-03-26: 3 [IU] via SUBCUTANEOUS
  Filled 2020-03-25 (×5): qty 1

## 2020-03-25 MED ORDER — LOPERAMIDE HCL 2 MG PO CAPS
2.0000 mg | ORAL_CAPSULE | ORAL | Status: DC | PRN
  Administered 2020-03-25 – 2020-03-26 (×2): 2 mg via ORAL
  Filled 2020-03-25 (×2): qty 1

## 2020-03-25 MED ORDER — INSULIN GLARGINE 100 UNIT/ML ~~LOC~~ SOLN
5.0000 [IU] | Freq: Every day | SUBCUTANEOUS | Status: DC
  Administered 2020-03-25 – 2020-03-29 (×5): 5 [IU] via SUBCUTANEOUS
  Filled 2020-03-25 (×6): qty 0.05

## 2020-03-25 MED ORDER — MAGNESIUM SULFATE IN D5W 1-5 GM/100ML-% IV SOLN
1.0000 g | Freq: Once | INTRAVENOUS | Status: AC
  Administered 2020-03-25: 1 g via INTRAVENOUS
  Filled 2020-03-25: qty 100

## 2020-03-25 MED ORDER — POTASSIUM CHLORIDE 10 MEQ/100ML IV SOLN
10.0000 meq | INTRAVENOUS | Status: AC
Start: 2020-03-25 — End: 2020-03-25
  Administered 2020-03-25 (×2): 10 meq via INTRAVENOUS
  Filled 2020-03-25 (×2): qty 100

## 2020-03-25 MED ORDER — POTASSIUM CHLORIDE CRYS ER 20 MEQ PO TBCR
40.0000 meq | EXTENDED_RELEASE_TABLET | Freq: Every day | ORAL | Status: DC
  Administered 2020-03-25 – 2020-03-26 (×2): 40 meq via ORAL
  Filled 2020-03-25 (×2): qty 2

## 2020-03-25 NOTE — Progress Notes (Addendum)
[place] at [place] NAME: Sheri Ryan    MR#:  [id number]  DATE OF BIRTH:  January 11, 1930  SUBJECTIVE:  I feel a lot better. Currently on high flow nasal cannula oxygen about 6 L. Husband at bedside. Denies chest pain. Tolerating PO diet.  REVIEW OF SYSTEMS:   Review of Systems  Constitutional: Negative for chills, fever and weight loss.  HENT: Negative for ear discharge, ear pain and nosebleeds.   Eyes: Negative for blurred vision, pain and discharge.  Respiratory: Positive for shortness of breath. Negative for sputum production, wheezing and stridor.   Cardiovascular: Negative for chest pain, palpitations, orthopnea and PND.  Gastrointestinal: Negative for abdominal pain, diarrhea, nausea and vomiting.  Genitourinary: Negative for frequency and urgency.  Musculoskeletal: Negative for back pain and joint pain.  Neurological: Positive for weakness. Negative for sensory change, speech change and focal weakness.  Psychiatric/Behavioral: Negative for depression and hallucinations. The patient is not nervous/anxious.    Tolerating Diet:yes Tolerating PT:   DRUG ALLERGIES:  No Known Allergies  VITALS:  Blood pressure (!) 141/69, pulse 74, temperature 98.8 F (37.1 C), temperature source Oral, resp. rate 12, height 5\' 5"  (1.651 m), weight 69.5 kg, SpO2 100 %.  PHYSICAL EXAMINATION:   Physical Exam  GENERAL:  87 y.o.-year-old patient lying in the bed with no acute distress.   LUNGS: decreased breath sounds bilaterally, no wheezing, rales, rhonchi. No use of accessory muscles of respiration.  CARDIOVASCULAR: S1, S2 normal. No murmurs, rubs, or gallops.  ABDOMEN: Soft, nontender, nondistended. Bowel sounds present. No organomegaly or mass.  EXTREMITIES: No cyanosis, clubbing or edema b/l.    NEUROLOGIC: Cranial nerves II through XII are intact. No focal Motor or sensory deficits b/l.   PSYCHIATRIC:  patient is alert and oriented x 3.   SKIN: No obvious rash, lesion, or ulcer.   LABORATORY PANEL:  CBC Recent Labs  Lab 03/25/20 0510  WBC 10.4  HGB 8.5*  HCT 27.0*  PLT 400    Chemistries  Recent Labs  Lab 03/19/20 0814 03/19/20 1609 03/25/20 0510  NA 137   < > 138  K 4.0   < > 3.1*  CL 105   < > 101  CO2 15*   < > 27  GLUCOSE 375*   < > 124*  BUN 27*   < > 46*  CREATININE 1.72*   < > 2.26*  CALCIUM 8.3*   < > 8.5*  MG 2.2   < > 1.8  AST 33  --   --   ALT 30  --   --   ALKPHOS 85  --   --   BILITOT 0.8  --   --    < > = values in this interval not displayed.   Cardiac Enzymes No results for input(s): TROPONINI in the last 168 hours. RADIOLOGY:  US RENAL  Result Date: 03/23/2020 CLINICAL DATA:  Acute kidney injury EXAM: RENAL / URINARY TRACT ULTRASOUND COMPLETE COMPARISON:  February 04, 2020 FINDINGS: Right Kidney: Renal measurements: 8.5 x 3.9 x 4.8 cm = volume: 83 mL. There is no hydronephrosis. The cortical echogenicity is slightly increased. Left Kidney: Renal measurements: 8.1 x 4.4 x 3.5 cm = volume: 65 mL. There is no hydronephrosis. The cortical echogenicity is slightly increased. There is a cyst in the upper pole measuring 2.7 cm. Bladder: The bladder is decompressed by Foley catheter. Other: Incidentally noted is a left-sided pleural effusion. IMPRESSION: 1. No hydronephrosis.  2. Echogenic kidneys bilaterally which can be seen in patients with medical renal disease. The bladder was not well evaluated secondary to the presence of a Foley catheter. 3. Incidentally noted left-sided pleural effusion. Electronically Signed   By: Sheri Ryan M.D.   On: 03/23/2020 19:48   ASSESSMENT AND PLAN:  Sheri Ryan is a 87 y.o. female brought in by EMS on CPAP for shortness of breath/respiratory failure.  They report room air saturation was 80% upon arrival, no reports of fevers.  Acute hypoxic respiratory failure secondary to pulmonary edema/pulmonary hypertension with underlying chronic anemia  and mitral regurgitation Acute on chronic diastolic dysfunction Sepsis ruled out -- patient was placed on BiPAP. Currently she is on high flow nasal cannula oxygen. BiPAP as needed -- continue IV Lasix diuresis per cardiology recommendation -- appreciate [facility] cardiology input. Plans for right heart catheterization likely next week. -- Patient is improving slowly. Good urine output.  Elevated troponin in the setting of congestive heart failure/pulmonary edema -- initial troponin 34--- 491 -- no heparin given underlying anemia -- echo shows preserved LV systolic function.  Acute on CKD stage IIIa -- baseline creatinine 1.6 -- 1.8 --creat 2.26  Anemia of chronic disease -- hemoglobin stable at 8.5 -- she is Jehov[employer]'s Witness  Hypertension-- home meds are lisinopril, amlodipine, Nebivolo --currently on nebivolol, amlodipine and hydralazine  Diabetes type II with chronic kidney disease non-insulin-dependent -- currently continue Lantus. -- Hemoglobin 7.5 on 02/19/20  Procedures: Family communication : husband at bed side Consults : cardiology, nephrology CODE STATUS: partial, DNI DVT Prophylaxis : heparin Level of care: Stepdown Status is: Inpatient  Remains inpatient appropriate because:Inpatient level of care appropriate due to severity of illness   Dispo: The patient is from: Home              Anticipated d/c is to: Home              Patient currently is not medically stable to d/c.   Difficult to place patient No  Admitted with acute respiratory failure. Currently on high flow nasal cannula oxygen. Will monitor with the weekend and cardiology likely plans right heart catheterization next week depending on creatinine levels.      TOTAL TIME TAKING CARE OF THIS PATIENT: 25 minutes.  >50% time spent on counselling and coordination of care  Note: This dictation was prepared with Dragon dictation along with smaller phrase technology. Any transcriptional errors that result  from this process are unintentional.  Sheri Ryan M.D    [facility]   CC: Primary care physician; Sheri Ryan, MDPatient ID: Sheri Ryan, female   DOB: [birth year], 87 y.o.   MRN: [id number]

## 2020-03-25 NOTE — Consult Note (Addendum)
[place] for Electrolyte Monitoring and Replacement   Recent Labs: Potassium (mmol/L)  Date Value  03/25/2020 3.1 (L)   Magnesium (mg/dL)  Date Value  03/24/2020 2.1   Calcium (mg/dL)  Date Value  03/25/2020 8.5 (L)   Albumin (g/dL)  Date Value  03/23/2020 2.5 (L)  07/31/2019 3.9   Phosphorus (mg/dL)  Date Value  03/24/2020 3.9   Sodium (mmol/L)  Date Value  03/25/2020 138  07/31/2019 137   Corrected Ca: 9.7 mg/dL Add-On magnesium: 1.8 mg/dL  Assessment: 87 yo F presented with acute respiratory failure likely due to pulmonary edema.Pt with low hemoglobin, unable to transfuse for religious reasons. Nephrology consulted for worsening metabolic acidosis and anemia in setting of CKD with elevated serum creatinine.    Diuretics: furosemide 80 mg IV BID  Goal of Therapy:  Potassium 4.0 - 5.1 mmol/L Magnesium 2.0 - 2.4 mg/dL All Other Electrolytes WNL  Plan (conservative given renal function):   10 mEq IV KCl x 2  40 mEq oral KCl once daily  1 gram IV magnesium sulfate x 1  Follow-up electrolytes with AM labs tomorrow  [name], PharmD 03/25/2020 6:52 AM

## 2020-03-25 NOTE — Progress Notes (Signed)
[facility]  ROUNDING NOTE   Subjective:   [name] was admitted to [facility] on 03/19/2020 for Respiratory failure [facility]) [J96.90] Acute pulmonary edema ([place]) [J81.0] Acute respiratory failure with hypoxia ([place]) [J96.01] Diabetic ketoacidosis without coma associated with type 2 diabetes mellitus ([place]) [E11.10] Anemia, unspecified type [D64.9]  UOP 2657mL - furosemide 80mg  IV bid  Weaned down to HFNC 6 L O2 Patient states she is feeling better.   Objective:  Vital signs in last 24 hours:  Temp:  [98.3 F (36.8 C)-98.9 F (37.2 C)] 98.7 F (37.1 C) (03/11 0500) Pulse Rate:  [66-88] 72 (03/11 0600) Resp:  [13-25] 19 (03/11 0600) BP: (140-177)/(60-125) 162/125 (03/11 0600) SpO2:  [93 %-100 %] 98 % (03/11 0600) FiO2 (%):  [40 %-49 %] 40 % (03/10 1640) Weight:  [69.5 kg] 69.5 kg (03/11 0500)  Weight change:  Filed Weights   03/21/20 0500 03/22/20 0500 03/25/20 0500  Weight: 73.8 kg 74 kg 69.5 kg    Intake/Output: I/O last 3 completed shifts: In: 2270 [P.O.:2270] Out: 4425 [IZTIW:5809]   Intake/Output this shift:  No intake/output data recorded.  Physical Exam: General: ill  Head: HFNC+ +hard of hearing  Eyes: Anicteric, PERRL  Neck: Supple, trachea midline  Lungs:  Bilateral crackles at bases  Heart: Regular rate and rhythm  Abdomen:  Soft, nontender  Extremities: no peripheral edema.  Neurologic: Nonfocal, moving all four extremities  Skin: No lesions  GU: Foley with urine    Basic Metabolic Panel: Recent Labs  Lab 03/19/20 0814 03/19/20 1609 03/20/20 0500 03/21/20 0622 03/22/20 0502 03/22/20 1121 03/23/20 0705 03/24/20 0420 03/25/20 0510  NA 137   < > 140 141 143 145 143 143 138  K 4.0   < > 4.6 4.0 3.4* 4.0 4.9 4.6 3.1*  CL 105   < > 109 109 110 110 113* 106 101  CO2 15*   < > 19* 23 20* 23 16* 27 27  GLUCOSE 375*   < > 123* 101* 129* 122* 128* 87 124*  BUN 27*   < > 30* 31* 36* 38* 42* 41* 46*  CREATININE 1.72*   < >  1.68* 1.85* 2.01* 1.89* 2.40* 2.21* 2.26*  CALCIUM 8.3*   < > 8.7* 8.5* 8.3* 8.7* 8.7* 8.8* 8.5*  MG 2.2  --  2.1 2.0 2.0  --  2.1 2.1 1.8  PHOS 4.0  --  4.7* 4.4 4.1  --   --  3.9  --    < > = values in this interval not displayed.    Liver Function Tests: Recent Labs  Lab 03/19/20 0814 03/23/20 0705  AST 33  --   ALT 30  --   ALKPHOS 85  --   BILITOT 0.8  --   PROT 7.6  --   ALBUMIN 3.1* 2.5*   No results for input(s): LIPASE, AMYLASE in the last 168 hours. No results for input(s): AMMONIA in the last 168 hours.  CBC: Recent Labs  Lab 03/20/20 0500 03/21/20 0622 03/22/20 0502 03/23/20 0705 03/25/20 0510  WBC 16.5* 15.3* 13.3* 13.5* 10.4  HGB 8.2* 7.9* 7.3* 7.6* 8.5*  HCT 25.6* 24.8* 21.8* 22.6* 27.0*  MCV 81.5 79.7* 77.0* 77.4* 80.1  PLT 332 360 352 324 400    Cardiac Enzymes: No results for input(s): CKTOTAL, CKMB, CKMBINDEX, TROPONINI in the last 168 hours.  BNP: Invalid input(s): POCBNP  CBG: Recent Labs  Lab 03/24/20 1919 03/24/20 2303 03/25/20 0420 03/25/20 0517 03/25/20 9833  GLUCAP 563* 875* 64* 332* 95    Microbiology: Results for orders placed or performed during the hospital encounter of 03/19/20  Blood culture (routine x 2)     Status: None   Collection Time: 03/19/20  8:14 AM   Specimen: BLOOD  Result Value Ref Range Status   Specimen Description BLOOD RIGHT FOREARM  Final   Special Requests   Final    BOTTLES DRAWN AEROBIC AND ANAEROBIC Blood Culture results may not be optimal due to an excessive volume of blood received in culture bottles   Culture   Final    NO GROWTH 5 DAYS Performed at [facility], [address] [place], [place] [address]    Report Status 03/24/2020 FINAL  Final  Blood culture (routine x 2)     Status: None   Collection Time: 03/19/20  8:14 AM   Specimen: BLOOD  Result Value Ref Range Status   Specimen Description BLOOD RIGHT HAND  Final   Special Requests   Final    BOTTLES DRAWN AEROBIC AND  ANAEROBIC Blood Culture adequate volume   Culture   Final    NO GROWTH 5 DAYS Performed at [facility], [address] [place], [place] [address]    Report Status 03/24/2020 FINAL  Final  Resp Panel by RT-PCR (Flu A&B, Covid) Nasopharyngeal Swab     Status: None   Collection Time: 03/19/20  8:14 AM   Specimen: Nasopharyngeal Swab; Nasopharyngeal(NP) swabs in vial transport medium  Result Value Ref Range Status   SARS Coronavirus 2 by RT PCR NEGATIVE NEGATIVE Final    Comment: (NOTE) SARS-CoV-2 target nucleic acids are NOT DETECTED.  The SARS-CoV-2 RNA is generally detectable in upper respiratory specimens during the acute phase of infection. The lowest concentration of SARS-CoV-2 viral copies this assay can detect is 138 copies/mL. A negative result does not preclude SARS-Cov-2 infection and should not be used as the sole basis for treatment or other patient management decisions. A negative result may occur with  improper specimen collection/handling, submission of specimen other than nasopharyngeal swab, presence of viral mutation(s) within the areas targeted by this assay, and inadequate number of viral copies(<138 copies/mL). A negative result must be combined with clinical observations, patient history, and epidemiological information. The expected result is Negative.  Fact Sheet for Patients:  EntrepreneurPulse.com.au  Fact Sheet for Healthcare Providers:  IncredibleEmployment.be  This test is no t yet approved or cleared by the [place] FDA and  has been authorized for detection and/or diagnosis of SARS-CoV-2 by FDA under an Emergency Use Authorization (EUA). This EUA will remain  in effect (meaning this test can be used) for the duration of the COVID-19 declaration under Section 564(b)(1) of the Act, 21 U.S.C.section 360bbb-3(b)(1), unless the authorization is terminated  or revoked sooner.       Influenza A by PCR  NEGATIVE NEGATIVE Final   Influenza B by PCR NEGATIVE NEGATIVE Final    Comment: (NOTE) The Xpert Xpress SARS-CoV-2/FLU/RSV plus assay is intended as an aid in the diagnosis of influenza from Nasopharyngeal swab specimens and should not be used as a sole basis for treatment. Nasal washings and aspirates are unacceptable for Xpert Xpress SARS-CoV-2/FLU/RSV testing.  Fact Sheet for Patients: EntrepreneurPulse.com.au  Fact Sheet for Healthcare Providers: IncredibleEmployment.be  This test is not yet approved or cleared by the [place] FDA and has been authorized for detection and/or diagnosis of SARS-CoV-2 by FDA under an Emergency Use Authorization (EUA). This EUA will remain  in effect (meaning this test can be used) for the duration of the COVID-19 declaration under Section 564(b)(1) of the Act, 21 U.S.C. section 360bbb-3(b)(1), unless the authorization is terminated or revoked.  Performed at [facility], [address] [place], [place] [address]   MRSA PCR Screening     Status: None   Collection Time: 03/19/20 11:17 AM   Specimen: Nasopharyngeal  Result Value Ref Range Status   MRSA by PCR NEGATIVE NEGATIVE Final    Comment:        The GeneXpert MRSA Assay (FDA approved for NASAL specimens only), is one component of a comprehensive MRSA colonization surveillance program. It is not intended to diagnose MRSA infection nor to guide or monitor treatment for MRSA infections. Performed at [facility], [address] [place], [place] [address]     Coagulation Studies: No results for input(s): LABPROT, INR in the last 72 hours.  Urinalysis: Recent Labs    03/23/20 1530  COLORURINE YELLOW*  LABSPEC 1.008  PHURINE 5.0  GLUCOSEU NEGATIVE  HGBUR MODERATE*  BILIRUBINUR NEGATIVE  KETONESUR NEGATIVE  PROTEINUR 100*  NITRITE NEGATIVE  LEUKOCYTESUR LARGE*      Imaging: US RENAL  Result Date:  03/23/2020 CLINICAL DATA:  Acute kidney injury EXAM: RENAL / URINARY TRACT ULTRASOUND COMPLETE COMPARISON:  February 04, 2020 FINDINGS: Right Kidney: Renal measurements: 8.5 x 3.9 x 4.8 cm = volume: 83 mL. There is no hydronephrosis. The cortical echogenicity is slightly increased. Left Kidney: Renal measurements: 8.1 x 4.4 x 3.5 cm = volume: 65 mL. There is no hydronephrosis. The cortical echogenicity is slightly increased. There is a cyst in the upper pole measuring 2.7 cm. Bladder: The bladder is decompressed by Foley catheter. Other: Incidentally noted is a left-sided pleural effusion. IMPRESSION: 1. No hydronephrosis. 2. Echogenic kidneys bilaterally which can be seen in patients with medical renal disease. The bladder was not well evaluated secondary to the presence of a Foley catheter. 3. Incidentally noted left-sided pleural effusion. Electronically Signed   By: Constance Holster M.D.   On: 03/23/2020 19:48     Medications:   . sodium chloride Stopped (03/20/20 1603)  . potassium chloride     . amLODipine  10 mg Oral Daily  . Chlorhexidine Gluconate Cloth  6 each Topical Q0600  . famotidine  20 mg Oral Daily  . furosemide  80 mg Intravenous BID  . heparin  5,000 Units Subcutaneous Q8H  . hydrALAZINE  25 mg Oral Q8H  . insulin aspart  0-15 Units Subcutaneous Q4H  . insulin glargine  10 Units Subcutaneous QHS  . mouth rinse  15 mL Mouth Rinse BID  . nebivolol  10 mg Oral Daily  . sodium chloride flush  3 mL Intravenous Q12H   sodium chloride, acetaminophen, dextrose, docusate sodium, hydrALAZINE, midazolam, morphine injection, ondansetron (ZOFRAN) IV, polyethylene glycol, sodium chloride flush  Assessment/ Plan:  [name] is a 87 y.o. black Jehovah witness female with diabetes mellitus type II, diabetic retinopathy, hypertension, left eye blindness, congestive heart failure, who is admitted to [facility] on 03/19/2020 for Respiratory failure ([place]) [J96.90] Acute pulmonary  edema ([place]) [J81.0] Acute respiratory failure with hypoxia ([place]) [J96.01] Diabetic ketoacidosis without coma associated with type 2 diabetes mellitus ([place]) [E11.10] Anemia, unspecified type [D64.9]  1. Acute kidney injury on chronic kidney disease stage IIIB with nephrotic range proteinuria. (>4.4 grams) Baseline creatinine of 1.8, GFR of 32 on 02/19/2020.  Chronic kidney disease thought to be secondary to diabetic nephropathy  although serologic work up not complete.  ANA negative. SPEP/UPEP negative. ANCA negative, Anti-GBM negative. Complements normal Holding home lisinopril dose.   2. Acute exacerbation of congestive heart failure: echocardiogram on 3/6 with preserved systolic function.  - IV furosemide - low salt diet - fluid restriction  3. Hypertension: home regimen of lisinopril, amlodipine and nebivolol.  - IV furosemide as above - Continue nebivolol and amlodipine.   4. Anemia with kidney failure: hemoglobin 8.5 microcytic. With iron deficiency. 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  - IV iron.   5. Diabetes mellitus type II with chronic kidney disease: noninsulin dependent at baseline. Currently on insulin glargine and sliding scale. Hemoglobin A1c of 7.5% on 02/19/20.  Not currently on an SGLT-2 inhibitor.   6. Hypokalemia: secondary to IV furosemide - PO potassium chloride    LOS: 6 Sarath Kolluru 3/11/20229:35 AM

## 2020-03-25 NOTE — Progress Notes (Signed)
A very pleasant visit with patient today. She was very talkative and joyful compared to previous days. Husband was bedside, they both are pleased with improvement medically.

## 2020-03-25 NOTE — Progress Notes (Signed)
Inpatient Diabetes Program Recommendations  AACE/ADA: New Consensus Statement on Inpatient Glycemic Control (2015)  Target Ranges:  Prepandial:   less than 140 mg/dL      Peak postprandial:   less than 180 mg/dL (1-2 hours)      Critically ill patients:  140 - 180 mg/dL   Results for Sheri Ryan, Sheri Ryan (MRN [id number]) as of 03/25/2020 06:44  Ref. Range 03/23/2020 23:10 03/24/2020 03:18 03/24/2020 07:15 03/24/2020 11:36 03/24/2020 15:23 03/24/2020 19:19  Glucose-Capillary Latest Ref Range: 70 - 99 mg/dL 93 88 89 199 (H) 169 (H) 107 (H)   Results for [name], [name] (MRN [id number]) as of 03/25/2020 06:44  Ref. Range 03/24/2020 23:03 03/25/2020 04:20 03/25/2020 05:17  Glucose-Capillary Latest Ref Range: 70 - 99 mg/dL 116 (H) 66 (L) 130 (H)    Home DM Meds: Glipizide 10 mg Daily                             Januvia 50 mg Daily (NOT taking)  Current Orders: Lantus 10 units QHS                            Novolog Resistant Correction Scale/ SSI (0-15 units) Q4 hours        MD- May want to recheck Current A1c level.    Last one on file was 7.7% back on 11/25/2019 (see Office visit notes from 01/29/2020)  May also consider reducing Lantus to 5 units QHS (pt had Mild Hypoglycemic event at 4am today (CBG 66)    --Will follow patient during hospitalization--  Wyn Quaker RN, MSN, CDE Diabetes Coordinator Inpatient Glycemic Control Team Team Pager: [phone] (8a-5p)

## 2020-03-25 NOTE — Progress Notes (Signed)
Occupational Therapy Treatment Patient Details Name: [place] Flett MRN: [id number] DOB: 08-24-1929 Today's Date: 03/25/2020    History of present illness 87 yo AAF with Severe ACUTE Hypoxic and Hypercapnic Respiratory Failure due to bilateral infiltrates with acute bacterial/viral pneumonia with findings of acute S CHF exacerbation with acute on chronic renal failure with acidosis and DKA. PMH of Blind left eye, Cataract, Chronic kidney disease, Diabetes mellitus without complication ([place]), Hypertension, and Retinopathy.   OT comments  Ms Guizar was seen for OT treatment on this date. Upon arrival to room pt eager for therapy and motivated to participate, husband and grandson at bedside. MOD I bed mobility. CGA + RW for ADL t/f - assist for lines mgmt and VCs for hand placement. SETUP tooth brushing seated EOC. SBA adjust B socks seated EOC. Pt completed IS and sit<>stand exercises with cues for technique. SpO2 100% on 6L HFNC, 95% on RA (removed for moving around bed). Pt making good progress toward goals. Pt continues to benefit from skilled OT services to maximize return to PLOF and minimize risk of future falls, injury, caregiver burden, and readmission. Will continue to follow POC. Discharge recommendation remains appropriate.    Follow Up Recommendations  Home health OT    Equipment Recommendations  None recommended by OT    Recommendations for Other Services      Precautions / Restrictions Precautions Precautions: Fall Restrictions Weight Bearing Restrictions: No       Mobility Bed Mobility Overal bed mobility: Modified Independent             General bed mobility comments: Increased time    Transfers Overall transfer level: Needs assistance Equipment used: Rolling walker (2 wheeled) Transfers: Sit to/from Stand Sit to Stand: Min guard              Balance Overall balance assessment: Needs assistance Sitting-balance support: No upper extremity  supported;Feet supported Sitting balance-Leahy Scale: Good     Standing balance support: Bilateral upper extremity supported Standing balance-Leahy Scale: Fair                             ADL either performed or assessed with clinical judgement   ADL Overall ADL's : Needs assistance/impaired                                       General ADL Comments: CGA + RW for ADL t/f - assist for lines mgmt and VCs for hand placement. SETUP tooth brushing seated EOC. SBA adjust B socks seated EOC.               Cognition Arousal/Alertness: Awake/alert Behavior During Therapy: WFL for tasks assessed/performed Overall Cognitive Status: Within Functional Limits for tasks assessed                                          Exercises Exercises: Other exercises Other Exercises Other Exercises: Pt and family educated re: OT role, DME recs, d/c recs, falls prevention, HEP, ECS Other Exercises: LBD, sup>sit, sit<>stand x2, sitting/standing balance/tolerance, toothbrushing, IS           Pertinent Vitals/ Pain       Pain Assessment: No/denies pain         Frequency  Min 1X/week  Progress Toward Goals  OT Goals(current goals can now be found in the care plan section)  Progress towards OT goals: Progressing toward goals  Acute Rehab OT Goals Patient Stated Goal: to go home OT Goal Formulation: With patient Time For Goal Achievement: 04/07/20 Potential to Achieve Goals: Good ADL Goals Pt Will Perform Grooming: with modified independence;standing Pt Will Perform Lower Body Dressing: with modified independence;sit to/from stand Pt Will Transfer to Toilet: with modified independence;ambulating;regular height toilet  Plan Discharge plan remains appropriate;Frequency remains appropriate       AM-PAC OT "6 Clicks" Daily Activity     Outcome Measure   Help from another person eating meals?: None Help from another person taking care  of personal grooming?: A Little Help from another person toileting, which includes using toliet, bedpan, or urinal?: A Little Help from another person bathing (including washing, rinsing, drying)?: A Little Help from another person to put on and taking off regular upper body clothing?: A Little Help from another person to put on and taking off regular lower body clothing?: A Little 6 Click Score: 19    End of Session Equipment Utilized During Treatment: Rolling walker;Oxygen  OT Visit Diagnosis: Other abnormalities of gait and mobility (R26.89);Muscle weakness (generalized) (M62.81)   Activity Tolerance Patient tolerated treatment well   Patient Left in chair;with call bell/phone within reach;with family/visitor present;with nursing/sitter in room   Nurse Communication          Time: 6789-3810 OT Time Calculation (min): 27 min  Charges: OT General Charges $OT Visit: 1 Visit OT Treatments $Self Care/Home Management : 8-22 mins $Therapeutic Exercise: 8-22 mins  Dessie Coma, M.S. OTR/L  03/25/20, 3:00 PM  ascom [phone]

## 2020-03-25 NOTE — Progress Notes (Signed)
Progress Note  Patient Name: Sheri Ryan Date of Encounter: 03/25/2020  [facility] Cardiologist: Fletcher Anon  Subjective   Gradually feeling better but still some shortness of breath on HFNC.  No CP, palpitations, or edema.  Inpatient Medications    Scheduled Meds: . amLODipine  10 mg Oral Daily  . Chlorhexidine Gluconate Cloth  6 each Topical Q0600  . famotidine  20 mg Oral Daily  . furosemide  80 mg Intravenous BID  . heparin  5,000 Units Subcutaneous Q8H  . insulin aspart  0-15 Units Subcutaneous Q4H  . insulin glargine  10 Units Subcutaneous QHS  . mouth rinse  15 mL Mouth Rinse BID  . nebivolol  10 mg Oral Daily  . sodium chloride flush  3 mL Intravenous Q12H   Continuous Infusions: . sodium chloride Stopped (03/20/20 1603)  . potassium chloride     PRN Meds: sodium chloride, acetaminophen, dextrose, docusate sodium, hydrALAZINE, midazolam, morphine injection, ondansetron (ZOFRAN) IV, polyethylene glycol, sodium chloride flush   Vital Signs    Vitals:   03/25/20 0300 03/25/20 0400 03/25/20 0500 03/25/20 0600  BP: (!) 155/64 (!) 146/61 (!) 149/60 (!) 162/125  Pulse: 70 69 70 72  Resp: 14 15 16 19   Temp:   98.7 F (37.1 C)   TempSrc:   Oral   SpO2: 96% 96% 97% 98%  Weight:   69.5 kg   Height:        Intake/Output Summary (Last 24 hours) at 03/25/2020 0922 Last data filed at 03/25/2020 0500 Gross per 24 hour  Intake 2030 ml  Output 2675 ml  Net -645 ml   Last 3 Weights 03/25/2020 03/22/2020 03/21/2020  Weight (lbs) 153 lb 3.5 oz 163 lb 2.3 oz 162 lb 11.2 oz  Weight (kg) 69.5 kg 74 kg 73.8 kg      Telemetry    NSR - Personally Reviewed  ECG    No new tracing  Physical Exam   GEN: No acute distress.   Neck: JVP ~8 cm Cardiac: RRR, no murmurs, rubs, or gallops.  Respiratory: Diminished breath sounds at both lung bases.  No wheezes or crackles. GI: Soft, nontender, non-distended  MS: Trace pretibial edema; No deformity. Neuro:  Nonfocal   Psych: Normal affect   Labs    High Sensitivity Troponin:   Recent Labs  Lab 03/19/20 0814 03/21/20 0622 03/21/20 1113  TROPONINIHS 34* 491* 412*      Chemistry Recent Labs  Lab 03/19/20 0814 03/19/20 1609 03/23/20 0705 03/24/20 0420 03/25/20 0510  NA 137   < > 143 143 138  K 4.0   < > 4.9 4.6 3.1*  CL 105   < > 113* 106 101  CO2 15*   < > 16* 27 27  GLUCOSE 375*   < > 128* 87 124*  BUN 27*   < > 42* 41* 46*  CREATININE 1.72*   < > 2.40* 2.21* 2.26*  CALCIUM 8.3*   < > 8.7* 8.8* 8.5*  PROT 7.6  --   --   --   --   ALBUMIN 3.1*  --  2.5*  --   --   AST 33  --   --   --   --   ALT 30  --   --   --   --   ALKPHOS 85  --   --   --   --   BILITOT 0.8  --   --   --   --  GFRNONAA 28*   < > 19* 21* 20*  ANIONGAP 17*   < > 14 10 10    < > = values in this interval not displayed.     Hematology Recent Labs  Lab 03/22/20 0502 03/22/20 1121 03/23/20 0705 03/25/20 0510  WBC 13.3*  --  13.5* 10.4  RBC 2.83* 2.91* 2.92* 3.37*  HGB 7.3*  --  7.6* 8.5*  HCT 21.8*  --  22.6* 27.0*  MCV 77.0*  --  77.4* 80.1  MCH 25.8*  --  26.0 25.2*  MCHC 33.5  --  33.6 31.5  RDW 15.7*  --  15.9* 15.8*  PLT 352  --  324 400    BNP Recent Labs  Lab 03/21/20 0622 03/21/20 1953 03/22/20 0502  BNP 1,699.8* 1,544.3* 1,070.4*     DDimer No results for input(s): DDIMER in the last 168 hours.   Radiology    US RENAL  Result Date: 03/23/2020 CLINICAL DATA:  Acute kidney injury EXAM: RENAL / URINARY TRACT ULTRASOUND COMPLETE COMPARISON:  February 04, 2020 FINDINGS: Right Kidney: Renal measurements: 8.5 x 3.9 x 4.8 cm = volume: 83 mL. There is no hydronephrosis. The cortical echogenicity is slightly increased. Left Kidney: Renal measurements: 8.1 x 4.4 x 3.5 cm = volume: 65 mL. There is no hydronephrosis. The cortical echogenicity is slightly increased. There is a cyst in the upper pole measuring 2.7 cm. Bladder: The bladder is decompressed by Foley catheter. Other: Incidentally noted is  a left-sided pleural effusion. IMPRESSION: 1. No hydronephrosis. 2. Echogenic kidneys bilaterally which can be seen in patients with medical renal disease. The bladder was not well evaluated secondary to the presence of a Foley catheter. 3. Incidentally noted left-sided pleural effusion. Electronically Signed   By: Constance Holster M.D.   On: 03/23/2020 19:48    Cardiac Studies   TTE (03/20/2020): 1. Left ventricular ejection fraction, by estimation, is 55 to 60%. The  left ventricle has normal function. Left ventricular endocardial border  not optimally defined to evaluate regional wall motion. There is mild left  ventricular hypertrophy. Left  ventricular diastolic parameters are indeterminate.  2. Right ventricular systolic function is normal. The right ventricular  size is normal. There is moderately elevated pulmonary artery systolic  pressure. The estimated right ventricular systolic pressure is 02.7 mmHg.  3. Left atrial size was mildly dilated.  4. The mitral valve is normal in structure. Moderate mitral valve  regurgitation. No evidence of mitral stenosis.  5. The aortic valve is normal in structure. Aortic valve regurgitation is  not visualized. Mild to moderate aortic valve sclerosis/calcification is  present, without any evidence of aortic stenosis.   Patient Profile     87 y.o. female with history of CKD stage III, DM, HTN, HLD, anemia, and cervical radiculopathywho is a Jehovah's witness andis being seen today for the evaluation ofpulmonary edema  Assessment & Plan    Acute respiratory failure with hypoxia and acute on chronic HFpEF with pulmonary hypertension: Respiratory status is improving very slowly, though Ms. Todorov no longer requires around-the-clock BiPAP.  I&O's show significant fluid intake last night.  She is net negative 4.8 L this admission.  Wt is down 10 pounds since 3/8, though I question accuracy given that bed scale is being used.  Creatinine is  stable since yesterday but higher than on admission.  Severe anemia likely contributing to shortness of breath.  Continue furosemide 80 mg IV BID.  If breathing does not improve and renal function worsens,  would favor RHC on Monday to better understand hemodynamics.  Prognosis is guarded; consultation with palliative care may be helpful.  Hypertension: BP still quite elevated.  Continue amlodipine 10 mg daily and nebivolol 10 mg daily.  Add hydralazine 25 mg TID.  Continue diuresis.  Acute kidney injury superimposed on chronic kidney disease: BUN gradually trending up.  Creatinine stable from yesterday but overall worsening the last few days.  Nephrology following.  Continue diuresis.  Avoid nephrotoxic drugs.  Chronic anemia: Most likely related to chronic disease.  Patient refused blood products on religious grounds.  Per IM/nephrology.  For questions or updates, please contact [place] Please consult [email or web address] for contact info under [facility].     Signed, Nelva Bush, MD  03/25/2020, 9:22 AM

## 2020-03-26 DIAGNOSIS — I5033 Acute on chronic diastolic (congestive) heart failure: Secondary | ICD-10-CM | POA: Diagnosis not present

## 2020-03-26 DIAGNOSIS — R0602 Shortness of breath: Secondary | ICD-10-CM

## 2020-03-26 DIAGNOSIS — D649 Anemia, unspecified: Secondary | ICD-10-CM | POA: Diagnosis not present

## 2020-03-26 DIAGNOSIS — I272 Pulmonary hypertension, unspecified: Secondary | ICD-10-CM | POA: Diagnosis not present

## 2020-03-26 DIAGNOSIS — N1832 Chronic kidney disease, stage 3b: Secondary | ICD-10-CM | POA: Diagnosis not present

## 2020-03-26 DIAGNOSIS — J810 Acute pulmonary edema: Secondary | ICD-10-CM | POA: Diagnosis not present

## 2020-03-26 DIAGNOSIS — J9601 Acute respiratory failure with hypoxia: Secondary | ICD-10-CM | POA: Diagnosis not present

## 2020-03-26 LAB — BASIC METABOLIC PANEL
Anion gap: 10 (ref 5–15)
BUN: 56 mg/dL — ABNORMAL HIGH (ref 8–23)
CO2: 24 mmol/L (ref 22–32)
Calcium: 8.5 mg/dL — ABNORMAL LOW (ref 8.9–10.3)
Chloride: 101 mmol/L (ref 98–111)
Creatinine, Ser: 2.39 mg/dL — ABNORMAL HIGH (ref 0.44–1.00)
GFR, Estimated: 19 mL/min — ABNORMAL LOW (ref >60)
Glucose, Bld: 107 mg/dL — ABNORMAL HIGH (ref 70–99)
Potassium: 3.8 mmol/L (ref 3.5–5.1)
Sodium: 135 mmol/L (ref 135–145)

## 2020-03-26 LAB — GLUCOSE, CAPILLARY
Glucose-Capillary: 104 mg/dL — ABNORMAL HIGH (ref 70–99)
Glucose-Capillary: 106 mg/dL — ABNORMAL HIGH (ref 70–99)
Glucose-Capillary: 140 mg/dL — ABNORMAL HIGH (ref 70–99)
Glucose-Capillary: 169 mg/dL — ABNORMAL HIGH (ref 70–99)
Glucose-Capillary: 211 mg/dL — ABNORMAL HIGH (ref 70–99)
Glucose-Capillary: 98 mg/dL (ref 70–99)

## 2020-03-26 LAB — MAGNESIUM: Magnesium: 2.4 mg/dL (ref 1.7–2.4)

## 2020-03-26 MED ORDER — INSULIN ASPART 100 UNIT/ML ~~LOC~~ SOLN
0.0000 [IU] | Freq: Three times a day (TID) | SUBCUTANEOUS | Status: DC
  Administered 2020-03-27: 2 [IU] via SUBCUTANEOUS
  Administered 2020-03-27: 5 [IU] via SUBCUTANEOUS
  Administered 2020-03-27: 3 [IU] via SUBCUTANEOUS
  Administered 2020-03-28: 2 [IU] via SUBCUTANEOUS
  Administered 2020-03-28: 5 [IU] via SUBCUTANEOUS
  Administered 2020-03-28 – 2020-03-29 (×2): 2 [IU] via SUBCUTANEOUS
  Administered 2020-03-29: 3 [IU] via SUBCUTANEOUS
  Administered 2020-03-29: 5 [IU] via SUBCUTANEOUS
  Filled 2020-03-26 (×9): qty 1

## 2020-03-26 MED ORDER — INSULIN ASPART 100 UNIT/ML ~~LOC~~ SOLN: 0.0000 [IU] | Freq: Every day | SUBCUTANEOUS | Status: DC

## 2020-03-26 NOTE — Progress Notes (Signed)
[facility], [place] 03/26/20  Subjective:   Patient seen at bedside in the ICU. Breathing much better. Urine output is acceptable. Diuretics are on hold at this time.  03/11 0701 - 03/12 0700 In: 300 [IV Piggyback:300] Out: 2202 [Urine:1475] Lab Results  Component Value Date   CREATININE 2.39 (H) 03/26/2020   CREATININE 2.26 (H) 03/25/2020   CREATININE 2.21 (H) 03/24/2020      Objective:  Vital signs in last 24 hours:  Temp:  [98.1 F (36.7 C)-98.3 F (36.8 C)] 98.3 F (36.8 C) (03/12 0800) Pulse Rate:  [66-77] 71 (03/12 1000) Resp:  [12-26] 19 (03/12 1000) BP: (109-157)/(58-86) 142/86 (03/12 1000) SpO2:  [90 %-100 %] 100 % (03/12 1000) Weight:  [68.6 kg] 68.6 kg (03/12 0500)  Weight change: -0.9 kg Filed Weights   03/22/20 0500 03/25/20 0500 03/26/20 0500  Weight: 74 kg 69.5 kg 68.6 kg    Intake/Output:    Intake/Output Summary (Last 24 hours) at 03/26/2020 1142 Last data filed at 03/26/2020 0600 Gross per 24 hour  Intake 300 ml  Output 1475 ml  Net -1175 ml     Physical Exam: General: Not in any acute distress, well built  HEENT Within normal limits  Neck Supple  Pulm/lungs Clear bilaterally for percussion auscultation  CVS/Heart S1-S2 regular in rate and rhythm  Abdomen:  Soft, bowel sounds positive, nontender  Extremities: Trace to 1+ edema bilaterally  Neurologic: Awake and alert     Access:        Basic Metabolic Panel:  Recent Labs  Lab 03/20/20 0500 03/21/20 0622 03/22/20 0502 03/22/20 1121 03/23/20 0705 03/24/20 0420 03/25/20 0510 03/26/20 0452  NA 140 141 143 145 143 143 138 135  K 4.6 4.0 3.4* 4.0 4.9 4.6 3.1* 3.8  CL 109 109 110 110 113* 106 101 101  CO2 19* 23 20* 23 16* 27 27 24   GLUCOSE 123* 101* 129* 122* 128* 87 124* 107*  BUN 30* 31* 36* 38* 42* 41* 46* 56*  CREATININE 1.68* 1.85* 2.01* 1.89* 2.40* 2.21* 2.26* 2.39*  CALCIUM 8.7* 8.5* 8.3* 8.7* 8.7* 8.8* 8.5* 8.5*  MG 2.1 2.0 2.0  --   2.1 2.1 1.8 2.4  PHOS 4.7* 4.4 4.1  --   --  3.9  --   --      CBC: Recent Labs  Lab 03/20/20 0500 03/21/20 0622 03/22/20 0502 03/23/20 0705 03/25/20 0510  WBC 16.5* 15.3* 13.3* 13.5* 10.4  HGB 8.2* 7.9* 7.3* 7.6* 8.5*  HCT 25.6* 24.8* 21.8* 22.6* 27.0*  MCV 81.5 79.7* 77.0* 77.4* 80.1  PLT 332 360 352 324 400     No results found for: HEPBSAG, HEPBSAB, HEPBIGM    Microbiology:  Recent Results (from the past 240 hour(s))  Blood culture (routine x 2)     Status: None   Collection Time: 03/19/20  8:14 AM   Specimen: BLOOD  Result Value Ref Range Status   Specimen Description BLOOD RIGHT FOREARM  Final   Special Requests   Final    BOTTLES DRAWN AEROBIC AND ANAEROBIC Blood Culture results may not be optimal due to an excessive volume of blood received in culture bottles   Culture   Final    NO GROWTH 5 DAYS Performed at [facility], [address] [place], [place] [address]    Report Status 03/24/2020 FINAL  Final  Blood culture (routine x 2)     Status: None   Collection Time: 03/19/20  8:14 AM  Specimen: BLOOD  Result Value Ref Range Status   Specimen Description BLOOD RIGHT HAND  Final   Special Requests   Final    BOTTLES DRAWN AEROBIC AND ANAEROBIC Blood Culture adequate volume   Culture   Final    NO GROWTH 5 DAYS Performed at [facility], [address] [place], [place] [address]    Report Status 03/24/2020 FINAL  Final  Resp Panel by RT-PCR (Flu A&B, Covid) Nasopharyngeal Swab     Status: None   Collection Time: 03/19/20  8:14 AM   Specimen: Nasopharyngeal Swab; Nasopharyngeal(NP) swabs in vial transport medium  Result Value Ref Range Status   SARS Coronavirus 2 by RT PCR NEGATIVE NEGATIVE Final    Comment: (NOTE) SARS-CoV-2 target nucleic acids are NOT DETECTED.  The SARS-CoV-2 RNA is generally detectable in upper respiratory specimens during the acute phase of infection. The lowest concentration of SARS-CoV-2 viral copies  this assay can detect is 138 copies/mL. A negative result does not preclude SARS-Cov-2 infection and should not be used as the sole basis for treatment or other patient management decisions. A negative result may occur with  improper specimen collection/handling, submission of specimen other than nasopharyngeal swab, presence of viral mutation(s) within the areas targeted by this assay, and inadequate number of viral copies(<138 copies/mL). A negative result must be combined with clinical observations, patient history, and epidemiological information. The expected result is Negative.  Fact Sheet for Patients:  EntrepreneurPulse.com.au  Fact Sheet for Healthcare Providers:  IncredibleEmployment.be  This test is no t yet approved or cleared by the [place] FDA and  has been authorized for detection and/or diagnosis of SARS-CoV-2 by FDA under an Emergency Use Authorization (EUA). This EUA will remain  in effect (meaning this test can be used) for the duration of the COVID-19 declaration under Section 564(b)(1) of the Act, 21 U.S.C.section 360bbb-3(b)(1), unless the authorization is terminated  or revoked sooner.       Influenza A by PCR NEGATIVE NEGATIVE Final   Influenza B by PCR NEGATIVE NEGATIVE Final    Comment: (NOTE) The Xpert Xpress SARS-CoV-2/FLU/RSV plus assay is intended as an aid in the diagnosis of influenza from Nasopharyngeal swab specimens and should not be used as a sole basis for treatment. Nasal washings and aspirates are unacceptable for Xpert Xpress SARS-CoV-2/FLU/RSV testing.  Fact Sheet for Patients: EntrepreneurPulse.com.au  Fact Sheet for Healthcare Providers: IncredibleEmployment.be  This test is not yet approved or cleared by the [place] FDA and has been authorized for detection and/or diagnosis of SARS-CoV-2 by FDA under an Emergency Use Authorization (EUA). This EUA  will remain in effect (meaning this test can be used) for the duration of the COVID-19 declaration under Section 564(b)(1) of the Act, 21 U.S.C. section 360bbb-3(b)(1), unless the authorization is terminated or revoked.  Performed at [facility], [address] [place], [place] [address]   MRSA PCR Screening     Status: None   Collection Time: 03/19/20 11:17 AM   Specimen: Nasopharyngeal  Result Value Ref Range Status   MRSA by PCR NEGATIVE NEGATIVE Final    Comment:        The GeneXpert MRSA Assay (FDA approved for NASAL specimens only), is one component of a comprehensive MRSA colonization surveillance program. It is not intended to diagnose MRSA infection nor to guide or monitor treatment for MRSA infections. Performed at [facility] [place], [address], [place], [place] [address]     Coagulation Studies: No results for  input(s): LABPROT, INR in the last 72 hours.  Urinalysis: Recent Labs    03/23/20 1530  COLORURINE YELLOW*  LABSPEC 1.008  PHURINE 5.0  GLUCOSEU NEGATIVE  HGBUR MODERATE*  BILIRUBINUR NEGATIVE  KETONESUR NEGATIVE  PROTEINUR 100*  NITRITE NEGATIVE  LEUKOCYTESUR LARGE*      Imaging: No results found.   Medications:   . sodium chloride Stopped (03/20/20 1603)   . amLODipine  10 mg Oral Daily  . Chlorhexidine Gluconate Cloth  6 each Topical Q0600  . famotidine  20 mg Oral Daily  . heparin  5,000 Units Subcutaneous Q8H  . hydrALAZINE  25 mg Oral Q8H  . insulin aspart  0-15 Units Subcutaneous Q4H  . insulin glargine  5 Units Subcutaneous QHS  . mouth rinse  15 mL Mouth Rinse BID  . nebivolol  10 mg Oral Daily  . potassium chloride  40 mEq Oral Daily  . sodium chloride flush  3 mL Intravenous Q12H   sodium chloride, acetaminophen, dextrose, docusate sodium, hydrALAZINE, loperamide, midazolam, morphine injection, ondansetron (ZOFRAN) IV, polyethylene glycol, sodium chloride flush  Assessment/ Plan:   [name] is a 87 y.o. black Jehovah witness female with diabetes mellitus type II, diabetic retinopathy, hypertension, left eye blindness, congestive heart failure, who is admitted to [facility] on 03/19/2020 for Respiratory failure ([place]) [J96.90] Acute pulmonary edema ([place]) [J81.0] Acute respiratory failure with hypoxia ([place]) [J96.01] Diabetic ketoacidosis without coma associated with type 2 diabetes mellitus ([place]) [E11.10] Anemia, unspecified type [D64.9]  1. Acute kidney injury on chronic kidney disease stage IIIB with nephrotic range proteinuria. (>4.4 grams) Baseline creatinine of 1.8, GFR of 32 on 02/19/2020.  Chronic kidney disease thought to be secondary to diabetic nephropathy although serologic work up not complete.  ANA negative. SPEP/UPEP negative. ANCA negative, Anti-GBM negative. Complements normal Holding home lisinopril dose.   2. Acute exacerbation of congestive heart failure: echocardiogram on 3/6 with preserved systolic function.  - IV furosemide now on hold - low salt diet - fluid restriction  3. Hypertension: home regimen of lisinopril, amlodipine and nebivolol.  - Continue nebivolol and amlodipine.   4. Anemia with kidney failure: hemoglobin 8.5 microcytic. With iron deficiency. 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  - IV iron.   5. Diabetes mellitus type II with chronic kidney disease: noninsulin dependent at baseline. Currently on insulin glargine and sliding scale. Hemoglobin A1c of 7.5% on 02/19/20.  Not currently on an SGLT-2 inhibitor.   6. Hypokalemia: secondary to IV furosemide - PO potassium chloride  Will continue fluid restriction and hold the diuretics for now And follow closely.    LOS: [place] 3/12/202211:42 AM  [place], [place]  Note: This note was prepared with Dragon dictation. Any transcription errors are unintentional

## 2020-03-26 NOTE — Consult Note (Signed)
[place] for Electrolyte Monitoring and Replacement   Recent Labs: Potassium (mmol/L)  Date Value  03/26/2020 3.8   Magnesium (mg/dL)  Date Value  03/26/2020 2.4   Calcium (mg/dL)  Date Value  03/26/2020 8.5 (L)   Albumin (g/dL)  Date Value  03/23/2020 2.5 (L)  07/31/2019 3.9   Phosphorus (mg/dL)  Date Value  03/24/2020 3.9   Sodium (mmol/L)  Date Value  03/26/2020 135  07/31/2019 137    Assessment: 87 yo F presented with acute respiratory failure likely due to pulmonary edema.Pt with low hemoglobin, unable to transfuse for religious reasons. Nephrology consulted for worsening metabolic acidosis and anemia in setting of CKD with elevated serum creatinine.    Diuretics: furosemide 80 mg IV BID  Goal of Therapy:  Potassium 4.0 - 5.1 mmol/L Magnesium 2.0 - 2.4 mg/dL All Other Electrolytes WNL  Plan (conservative given renal function):   Electrolytes stable this morning. On potassium 40 mEq daily given ongoing diuresis. No further replacement indicated.  Continue to follow along.  Tawnya Crook, PharmD Clinical Pharmacist 03/26/2020 7:47 AM

## 2020-03-26 NOTE — Progress Notes (Signed)
[facility] [place] NAME: Sheri Ryan    MR#:  [id number]  DATE OF BIRTH:  01-Nov-1929  SUBJECTIVE:  patient down to 2 L nasal cannula oxygen. Able to talk complete sentences without shortness of breath. Tolerating PO diet. No chest pain REVIEW OF SYSTEMS:   Review of Systems  Constitutional: Negative for chills, fever and weight loss.  HENT: Negative for ear discharge, ear pain and nosebleeds.   Eyes: Negative for blurred vision, pain and discharge.  Respiratory: Positive for shortness of breath. Negative for sputum production, wheezing and stridor.   Cardiovascular: Negative for chest pain, palpitations, orthopnea and PND.  Gastrointestinal: Negative for abdominal pain, diarrhea, nausea and vomiting.  Genitourinary: Negative for frequency and urgency.  Musculoskeletal: Negative for back pain and joint pain.  Neurological: Positive for weakness. Negative for sensory change, speech change and focal weakness.  Psychiatric/Behavioral: Negative for depression and hallucinations. The patient is not nervous/anxious.    Tolerating Diet:yes Tolerating PT: HHPT  DRUG ALLERGIES:  No Known Allergies  VITALS:  Blood pressure (!) 142/86, pulse 71, temperature 98.3 F (36.8 C), temperature source Oral, resp. rate 19, height 5\' 5"  (1.651 m), weight 68.6 kg, SpO2 100 %.  PHYSICAL EXAMINATION:   Physical Exam  GENERAL:  87 y.o.-year-old patient lying in the bed with no acute distress.   LUNGS: decreased breath sounds bilaterally, no wheezing, rales, rhonchi. No use of accessory muscles of respiration.  CARDIOVASCULAR: S1, S2 normal. No murmurs, rubs, or gallops.  ABDOMEN: Soft, nontender, nondistended. Bowel sounds present. No organomegaly or mass.  EXTREMITIES: No cyanosis, clubbing or edema b/l.    NEUROLOGIC: Cranial nerves II through XII are intact. No focal Motor or sensory deficits b/l.   PSYCHIATRIC:  patient is alert and oriented x 3.   SKIN: No obvious rash, lesion, or ulcer.   LABORATORY PANEL:  CBC Recent Labs  Lab 03/25/20 0510  WBC 10.4  HGB 8.5*  HCT 27.0*  PLT 400    Chemistries  Recent Labs  Lab 03/26/20 0452  NA 135  K 3.8  CL 101  CO2 24  GLUCOSE 107*  BUN 56*  CREATININE 2.39*  CALCIUM 8.5*  MG 2.4   Cardiac Enzymes No results for input(s): TROPONINI in the last 168 hours. RADIOLOGY:  No results found. ASSESSMENT AND PLAN:  Sheri Ryan is a 87 y.o. female brought in by EMS on CPAP for shortness of breath/respiratory failure.  They report room air saturation was 80% upon arrival, no reports of fevers.  Acute hypoxic respiratory failure secondary to pulmonary edema/pulmonary hypertension with underlying chronic anemia and mitral regurgitation Acute on chronic diastolic dysfunction Sepsis ruled out -- patient was placed on BiPAP--> high flow nasal cannula oxygen--> 2 liter [place] sats 100% -- Lasix 80 mg IV bid--Holding today due to rising creat -- appreciate [facility] cardiology input. ?Plans for right heart catheterization likely next week. -- Patient is improving slowly. Good urine output. --d/c foley  Elevated troponin in the setting of congestive heart failure/pulmonary edema -- initial troponin 34--- 491 -- no heparin given underlying anemia -- echo shows preserved LV systolic function.  Acute on CKD stage IIIa -- baseline creatinine 1.6 -- 1.8 --creat 2.26--2.39 (holding lasix)  Anemia of chronic disease -- hemoglobin stable at 8.5 -- she is Jehovah's Witness  Hypertension-- home meds are lisinopril, amlodipine, Nebivolo --currently on nebivolol, amlodipine and hydralazine  Diabetes type II with chronic kidney disease non-insulin-dependent -- currently continue  Lantus. -- Hemoglobin 8.5 on 3/11  Procedures: Family communication : husband at bed side Consults : cardiology, nephrology CODE STATUS: partial, DNI DVT Prophylaxis : heparin Level of care:  Med-Surg Status is: Inpatient  Remains inpatient appropriate because:Inpatient level of care appropriate due to severity of illness   Dispo: The patient is from: Home              Anticipated d/c is to: Home              Patient currently is not medically stable to d/c.   Difficult to place patient No  Admitted with acute respiratory failure. Currently on high flow nasal cannula oxygen. Will monitor with the weekend and cardiology likely plans right heart catheterization next week depending on creatinine levels.   Transfer to med-surg with tele     TOTAL TIME TAKING CARE OF THIS PATIENT: 25 minutes.  >50% time spent on counselling and coordination of care  Note: This dictation was prepared with Dragon dictation along with smaller phrase technology. Any transcriptional errors that result from this process are unintentional.  Fritzi Mandes M.D    [facility]   CC: Primary care physician; Glean Hess, MDPatient ID: Sheri Ryan, female   DOB: 09-10-1929, 87 y.o.   MRN: [id number]

## 2020-03-26 NOTE — Progress Notes (Signed)
Progress Note  Patient Name: Sheri Ryan Date of Encounter: 03/26/2020  [facility] Cardiologist: Dr. [place] Anon, [facility]  Subjective   Lying supine, family at the bedside, reports feeling well, denies shortness of breath Feels she is at her baseline No significant abdominal distention, no leg swelling Received Lasix IV twice daily yesterday -6 L  Inpatient Medications    Scheduled Meds: . amLODipine  10 mg Oral Daily  . Chlorhexidine Gluconate Cloth  6 each Topical Q0600  . famotidine  20 mg Oral Daily  . heparin  5,000 Units Subcutaneous Q8H  . hydrALAZINE  25 mg Oral Q8H  . [START ON 03/27/2020] insulin aspart  0-15 Units Subcutaneous TID WC  . insulin aspart  0-5 Units Subcutaneous QHS  . insulin glargine  5 Units Subcutaneous QHS  . mouth rinse  15 mL Mouth Rinse BID  . nebivolol  10 mg Oral Daily  . potassium chloride  40 mEq Oral Daily  . sodium chloride flush  3 mL Intravenous Q12H   Continuous Infusions: . sodium chloride Stopped (03/20/20 1603)   PRN Meds: sodium chloride, acetaminophen, dextrose, docusate sodium, hydrALAZINE, loperamide, midazolam, morphine injection, ondansetron (ZOFRAN) IV, polyethylene glycol, sodium chloride flush   Vital Signs    Vitals:   03/26/20 1000 03/26/20 1200 03/26/20 1425 03/26/20 1500  BP: (!) 142/86 (!) 144/61  137/62  Pulse: 71 71 71   Resp: 19 14 (!) 21 16  Temp:  97.9 F (36.6 C)    TempSrc:  Oral    SpO2: 100% 96% 100% 95%  Weight:      Height:        Intake/Output Summary (Last 24 hours) at 03/26/2020 1823 Last data filed at 03/26/2020 0600 Gross per 24 hour  Intake --  Output 1125 ml  Net -1125 ml   Last 3 Weights 03/26/2020 03/25/2020 03/22/2020  Weight (lbs) 151 lb 3.8 oz 153 lb 3.5 oz 163 lb 2.3 oz  Weight (kg) 68.6 kg 69.5 kg 74 kg      Telemetry    Normal sinus rhythm- Personally Reviewed  ECG     - Personally Reviewed  Physical Exam   Constitutional:  oriented to person, place, and  time. No distress.  HENT:  Head: Grossly normal Eyes:  no discharge. No scleral icterus.  Neck:  JVD 8 cm, no carotid bruits  Cardiovascular: Regular rate and rhythm, no murmurs appreciated Pulmonary/Chest: Clear to auscultation bilaterally, no wheezes or rails Abdominal: Soft.  no distension.  no tenderness.  Musculoskeletal: Normal range of motion Neurological:  normal muscle tone. Coordination normal. No atrophy Skin: Skin warm and dry Psychiatric: normal affect, pleasant   Labs    High Sensitivity Troponin:   Recent Labs  Lab 03/19/20 0814 03/21/20 0622 03/21/20 1113  TROPONINIHS 34* 491* 412*      Chemistry Recent Labs  Lab 03/23/20 0705 03/24/20 0420 03/25/20 0510 03/26/20 0452  NA 143 143 138 135  K 4.9 4.6 3.1* 3.8  CL 113* 106 101 101  CO2 16* 27 27 24   GLUCOSE 128* 87 124* 107*  BUN 42* 41* 46* 56*  CREATININE 2.40* 2.21* 2.26* 2.39*  CALCIUM 8.7* 8.8* 8.5* 8.5*  ALBUMIN 2.5*  --   --   --   GFRNONAA 19* 21* 20* 19*  ANIONGAP 14 10 10 10      Hematology Recent Labs  Lab 03/22/20 0502 03/22/20 1121 03/23/20 0705 03/25/20 0510  WBC 13.3*  --  13.5* 10.4  RBC 2.83* 2.91*  2.92* 3.37*  HGB 7.3*  --  7.6* 8.5*  HCT 21.8*  --  22.6* 27.0*  MCV 77.0*  --  77.4* 80.1  MCH 25.8*  --  26.0 25.2*  MCHC 33.5  --  33.6 31.5  RDW 15.7*  --  15.9* 15.8*  PLT 352  --  324 400    BNP Recent Labs  Lab 03/21/20 0622 03/21/20 1953 03/22/20 0502  BNP 1,699.8* 1,544.3* 1,070.4*     DDimer No results for input(s): DDIMER in the last 168 hours.   Radiology    No results found.  Cardiac Studies   Echo 1. Left ventricular ejection fraction, by estimation, is 55 to 60%. The  left ventricle has normal function. Left ventricular endocardial border  not optimally defined to evaluate regional wall motion. There is mild left  ventricular hypertrophy. Left  ventricular diastolic parameters are indeterminate.  2. Right ventricular systolic function is  normal. The right ventricular  size is normal. There is moderately elevated pulmonary artery systolic  pressure. The estimated right ventricular systolic pressure is 97.6 mmHg.  3. Left atrial size was mildly dilated.  4. The mitral valve is normal in structure. Moderate mitral valve  regurgitation. No evidence of mitral stenosis.  5. The aortic valve is normal in structure. Aortic valve regurgitation is  not visualized. Mild to moderate aortic valve sclerosis/calcification is  present, without any evidence of aortic stenosis.   Patient Profile     87 y.o. female with history of CKD stage III, DM, HTN, HLD, anemia, and cervical radiculopathywho is a Jehovah's witness andis being seen today for the evaluation ofpulmonary edema  Assessment & Plan   Acute hypoxic respiratory failure Diastolic heart failure, pulmonary edema in the setting of poorly controlled hypertension and anemia, renal failure with 6 L negative, she feels euvolemic Lasix on hold given climbing renal function, will reassess tomorrow  Pulmonary hypertension/diastolic CHF Moderately elevated right heart pressures on echo Symptoms exacerbated by anemia, low albumin Lasix on hold as above  Demand ischemia Elevated troponin in the setting of hypoxic respiratory failure No plan for ischemic work-up at this time Close follow-up in clinic  Anemia Iron deficiency, Iron infusion, completed May need to consider Epogen  Essential hypertension Continue bystolic and amlodipine, hydralazine Pressures 734L to 937 systolic May need to increase hydralazine to higher dose  Long discussion concerning CHF management, daily weights, adjustment of diuretics based on 3 pound weight gain or holding diuretics for weight loss concerning for prerenal state Discussed fluid restrictions, salt restrictions  Total encounter time more than 35 minutes  Greater than 50% was spent in counseling and coordination of care with the  patient    For questions or updates, please contact [place] [facility] Please consult [email or web address] for contact info under        Signed, Ida Rogue, MD  03/26/2020, 6:23 PM

## 2020-03-27 DIAGNOSIS — J810 Acute pulmonary edema: Secondary | ICD-10-CM

## 2020-03-27 DIAGNOSIS — R531 Weakness: Secondary | ICD-10-CM

## 2020-03-27 DIAGNOSIS — J9601 Acute respiratory failure with hypoxia: Secondary | ICD-10-CM | POA: Diagnosis not present

## 2020-03-27 DIAGNOSIS — N1832 Chronic kidney disease, stage 3b: Secondary | ICD-10-CM

## 2020-03-27 LAB — GLUCOSE, CAPILLARY
Glucose-Capillary: 129 mg/dL — ABNORMAL HIGH (ref 70–99)
Glucose-Capillary: 214 mg/dL — ABNORMAL HIGH (ref 70–99)
Glucose-Capillary: 189 mg/dL — ABNORMAL HIGH (ref 70–99)
Glucose-Capillary: 166 mg/dL — ABNORMAL HIGH (ref 70–99)

## 2020-03-27 LAB — BASIC METABOLIC PANEL
Anion gap: 13 (ref 5–15)
BUN: 63 mg/dL — ABNORMAL HIGH (ref 8–23)
CO2: 21 mmol/L — ABNORMAL LOW (ref 22–32)
Calcium: 8.7 mg/dL — ABNORMAL LOW (ref 8.9–10.3)
Chloride: 100 mmol/L (ref 98–111)
Creatinine, Ser: 2.35 mg/dL — ABNORMAL HIGH (ref 0.44–1.00)
GFR, Estimated: 19 mL/min — ABNORMAL LOW (ref >60)
Glucose, Bld: 213 mg/dL — ABNORMAL HIGH (ref 70–99)
Potassium: 4.9 mmol/L (ref 3.5–5.1)
Sodium: 134 mmol/L — ABNORMAL LOW (ref 135–145)

## 2020-03-27 LAB — CREATININE, SERUM
Creatinine, Ser: 2.18 mg/dL — ABNORMAL HIGH (ref 0.44–1.00)
GFR, Estimated: 21 mL/min — ABNORMAL LOW (ref >60)

## 2020-03-27 LAB — BRAIN NATRIURETIC PEPTIDE: B Natriuretic Peptide: 757.4 pg/mL — ABNORMAL HIGH (ref 0.0–100.0)

## 2020-03-27 MED ORDER — FUROSEMIDE 40 MG PO TABS
40.0000 mg | ORAL_TABLET | Freq: Every day | ORAL | Status: DC
  Administered 2020-03-28: 40 mg via ORAL
  Filled 2020-03-27: qty 1

## 2020-03-27 NOTE — TOC Progression Note (Addendum)
Transition of Care [facility]) - Progression Note    Patient Details  Name: Sheri Ryan MRN: [id number] Date of Birth: 1929-06-14  [facility] CM/SW Contact  Zigmund Daniel Dorian Pod, RN Phone [phone] 03/27/2020, 1:12 PM  Clinical Narrative:   Dr. Posey Pronto requesting nursing be added to the [facility] orders and possible home O2 currently awaiting saturation score for home oxygen id needed. PLEASE CALL THE SON TITUS Krapf [phone] concerning pt's discharge on Monday. RN has contacted [place] Corene Cornea) and requested to add RN to the PT/OT [facility] services. Pt will also need DME for a RW ([employer] will deliver to pt's room today). Inquired if pt would need Home O2 if so will need again saturation score however bedside nurse indicated pt doing well and would probably not need home O2 but would test pt later today and notify TOC if needed.  Son [name]) indicated another family member (his brother) would be picking up pt from the hospital but he would need to be called with update prior to pt's discharge.   TOC will continue to follow.  3:58 PM-Addendum-Informed via bedside RN Quillian Quince) pt's sats are 100% at rest and no home oxygen needed.   Expected Discharge Plan: [place] Barriers to Discharge: Continued Medical Work up  Expected Discharge Plan and Services Expected Discharge Plan: [place] In-house Referral: Clinical Social Work   Post Acute Care Choice: [place] arrangements for the past 2 months: Single Family Home                                       Social Determinants of Health (SDOH) Interventions    Readmission Risk Interventions No flowsheet data found.

## 2020-03-27 NOTE — Progress Notes (Addendum)
[place] [facility] [place] NAME: Sheri Ryan    MR#:  [id number]  DATE OF BIRTH:  [birth year]  SUBJECTIVE:  patient down to 1 L nasal cannula oxygen. Able to talk complete sentences without shortness of breath. Tolerating PO diet. No chest pain REVIEW OF SYSTEMS:   Review of Systems  Constitutional: Negative for chills, fever and weight loss.  HENT: Negative for ear discharge, ear pain and nosebleeds.   Eyes: Negative for blurred vision, pain and discharge.  Respiratory: Positive for shortness of breath. Negative for sputum production, wheezing and stridor.   Cardiovascular: Negative for chest pain, palpitations, orthopnea and PND.  Gastrointestinal: Negative for abdominal pain, diarrhea, nausea and vomiting.  Genitourinary: Negative for frequency and urgency.  Musculoskeletal: Negative for back pain and joint pain.  Neurological: Positive for weakness. Negative for sensory change, speech change and focal weakness.  Psychiatric/Behavioral: Negative for depression and hallucinations. The patient is not nervous/anxious.    Tolerating Diet:yes Tolerating PT: HHPT  DRUG ALLERGIES:  No Known Allergies  VITALS:  Blood pressure (!) 129/53, pulse 67, temperature 98.3 F (36.8 C), temperature source Oral, resp. rate 15, height 5\' 5"  (1.651 m), weight 70.3 kg, SpO2 98 %.  PHYSICAL EXAMINATION:   Physical Exam  GENERAL:  87 y.o.-year-old patient lying in the bed with no acute distress.   LUNGS: decreased breath sounds bilaterally, no wheezing, rales, rhonchi. No use of accessory muscles of respiration.  CARDIOVASCULAR: S1, S2 normal. No murmurs, rubs, or gallops.  ABDOMEN: Soft, nontender, nondistended. Bowel sounds present. No organomegaly or mass.  EXTREMITIES: No cyanosis, clubbing or edema b/l.    NEUROLOGIC: Cranial nerves II through XII are intact. No focal Motor or sensory deficits b/l.   PSYCHIATRIC:  patient is alert and oriented x 3.   SKIN: No obvious rash, lesion, or ulcer.   LABORATORY PANEL:  CBC Recent Labs  Lab 03/25/20 0510  WBC 10.4  HGB 8.5*  HCT 27.0*  PLT 400    Chemistries  Recent Labs  Lab 03/26/20 0452 03/27/20 0831 03/27/20 1156  NA 135  --  134*  K 3.8  --  4.9  CL 101  --  100  CO2 24  --  21*  GLUCOSE 107*  --  213*  BUN 56*  --  63*  CREATININE 2.39*   < > 2.35*  CALCIUM 8.5*  --  8.7*  MG 2.4  --   --    < > = values in this interval not displayed.   Cardiac Enzymes No results for input(s): TROPONINI in the last 168 hours. RADIOLOGY:  No results found. ASSESSMENT AND PLAN:  Sheri Ryan is a 87 y.o. female brought in by EMS on CPAP for shortness of breath/respiratory failure.  They report room air saturation was 80% upon arrival, no reports of fevers.  Acute hypoxic respiratory failure secondary to pulmonary edema/pulmonary hypertension with underlying chronic anemia and mitral regurgitation Acute on chronic diastolic dysfunction Sepsis ruled out -- patient was placed on BiPAP--> high flow nasal cannula oxygen--> 2 liter [place] sats 100%-->98% on 1 liters --assess for home oxygen -- Lasix 80 mg IV bid--Holding  due to rising creat -- appreciate [facility] cardiology input.per Dr Rockey Situ no Plans for right heart catheterization cont current mnx and f/u cardio as out pt -- Patient is improving slowly. Good urine output. --Resume lasix 40 mg from 03/28/20  Elevated troponin in the setting of congestive heart failure/pulmonary edema --  initial troponin 34--- 491 -- no heparin given underlying anemia -- echo shows preserved LV systolic function.  Acute on CKD stage IIIa -- baseline creatinine 1.6 -- 1.8 --creat 2.26--2.39 (holding lasix)--2.18--resume lasix 40 mg po from 3/14  Anemia of chronic disease -- hemoglobin stable at 8.5 -- she is Jehovah's Witness  Hypertension-- home meds are lisinopril, amlodipine, Nebivolo --currently on nebivolol, amlodipine and  hydralazine  Diabetes type II with chronic kidney disease non-insulin-dependent -- currently continue Lantus--consider resume glipizide lower dose -- Hemoglobin 8.5 on 3/11  Procedures: Family communication : son Sheri Ryan on the phone Consults : cardiology, nephrology CODE STATUS: partial, DNI DVT Prophylaxis : heparin Level of care: Med-Surg Status is: Inpatient  Remains inpatient appropriate because:Inpatient level of care appropriate due to severity of illness   Dispo: The patient is from: Home              Anticipated d/c is to: Home llikely 3/14 with [facility]              Patient currently is medically improving   Difficult to place patient No   Patient is medically improving. Will likely discharge her to home tomorrow with home health PT OT and RN. She will need to be assessed for home oxygen. Patient will follow-up with nephrology and cardiology as outpatient    TOTAL TIME TAKING CARE OF THIS PATIENT: 25 minutes.  >50% time spent on counselling and coordination of care  Note: This dictation was prepared with Dragon dictation along with smaller phrase technology. Any transcriptional errors that result from this process are unintentional.  Sheri Ryan M.D    [facility]   CC: Primary care physician; Glean Hess, MDPatient ID: Sheri Ryan, female   DOB: [birth year], 87 y.o.   MRN: [id number]

## 2020-03-27 NOTE — Progress Notes (Signed)
Progress Note  Patient Name: Sheri Ryan Date of Encounter: 03/27/2020  PCP:  Glean Hess, MD   [facility] [place]  Cardiologist:  Kathlyn Sacramento, MD  Advanced Practice Provider:  No care team member to display Electrophysiologist:  None    Subjective   No CP or SOB.  Feels better when compared with yesterday.  Inpatient Medications    Scheduled Meds: . amLODipine  10 mg Oral Daily  . famotidine  20 mg Oral Daily  . heparin  5,000 Units Subcutaneous Q8H  . hydrALAZINE  25 mg Oral Q8H  . insulin aspart  0-15 Units Subcutaneous TID WC  . insulin aspart  0-5 Units Subcutaneous QHS  . insulin glargine  5 Units Subcutaneous QHS  . mouth rinse  15 mL Mouth Rinse BID  . nebivolol  10 mg Oral Daily  . sodium chloride flush  3 mL Intravenous Q12H   Continuous Infusions: . sodium chloride Stopped (03/20/20 1603)   PRN Meds: sodium chloride, acetaminophen, dextrose, docusate sodium, hydrALAZINE, loperamide, ondansetron (ZOFRAN) IV, polyethylene glycol, sodium chloride flush   Vital Signs    Vitals:   03/27/20 0000 03/27/20 0129 03/27/20 0536 03/27/20 0842  BP: (!) 158/62 (!) 149/68 (!) 134/56 (!) 129/53  Pulse: 71 69 65 67  Resp: 20 18 16 15   Temp: 98 F (36.7 C) 98.3 F (36.8 C) 97.7 F (36.5 C) 98.3 F (36.8 C)  TempSrc: Oral Oral Oral Oral  SpO2: 94% 100% 100% 98%  Weight:  70.3 kg    Height:  5\' 5"  (1.651 m)      Intake/Output Summary (Last 24 hours) at 03/27/2020 1140 Last data filed at 03/27/2020 0900 Gross per 24 hour  Intake 358 ml  Output 400 ml  Net -42 ml   Last 3 Weights 03/27/2020 03/26/2020 03/25/2020  Weight (lbs) 154 lb 15.7 oz 151 lb 3.8 oz 153 lb 3.5 oz  Weight (kg) 70.3 kg 68.6 kg 69.5 kg      Telemetry    NSR - Personally Reviewed  ECG    No new tracings - Personally Reviewed  Physical Exam   GEN: No acute distress.   Neck: JVP~8cm Cardiac: RRR, no murmurs, rubs, or gallops.  Respiratory:  Slightly reduced bibasilar breath sounds. GI: Soft, nontender, non-distended  MS: No edema; No deformity. Neuro:  Nonfocal  Psych: Normal affect   Labs    High Sensitivity Troponin:   Recent Labs  Lab 03/19/20 0814 03/21/20 0622 03/21/20 1113  TROPONINIHS 34* 491* 412*      Cardiac EnzymesNo results for input(s): TROPONINI in the last 168 hours. No results for input(s): TROPIPOC in the last 168 hours.   Chemistry Recent Labs  Lab 03/23/20 0705 03/24/20 0420 03/25/20 0510 03/26/20 0452 03/27/20 0831  NA 143 143 138 135  --   K 4.9 4.6 3.1* 3.8  --   CL 113* 106 101 101  --   CO2 16* 27 27 24   --   GLUCOSE 128* 87 124* 107*  --   BUN 42* 41* 46* 56*  --   CREATININE 2.40* 2.21* 2.26* 2.39* 2.18*  CALCIUM 8.7* 8.8* 8.5* 8.5*  --   ALBUMIN 2.5*  --   --   --   --   GFRNONAA 19* 21* 20* 19* 21*  ANIONGAP 14 10 10 10   --      Hematology Recent Labs  Lab 03/22/20 0502 03/22/20 1121 03/23/20 0705 03/25/20 0510  WBC 13.3*  --  13.5* 10.4  RBC 2.83* 2.91* 2.92* 3.37*  HGB 7.3*  --  7.6* 8.5*  HCT 21.8*  --  22.6* 27.0*  MCV 77.0*  --  77.4* 80.1  MCH 25.8*  --  26.0 25.2*  MCHC 33.5  --  33.6 31.5  RDW 15.7*  --  15.9* 15.8*  PLT 352  --  324 400    BNP Recent Labs  Lab 03/21/20 0622 03/21/20 1953 03/22/20 0502  BNP 1,699.8* 1,544.3* 1,070.4*     DDimer No results for input(s): DDIMER in the last 168 hours.   Radiology    No results found.  Cardiac Studies   Echo 03/20/20 1. Left ventricular ejection fraction, by estimation, is 55 to 60%. The  left ventricle has normal function. Left ventricular endocardial border  not optimally defined to evaluate regional wall motion. There is mild left  ventricular hypertrophy. Left  ventricular diastolic parameters are indeterminate.  2. Right ventricular systolic function is normal. The right ventricular  size is normal. There is moderately elevated pulmonary artery systolic  pressure. The estimated right  ventricular systolic pressure is 80.3 mmHg.  3. Left atrial size was mildly dilated.  4. The mitral valve is normal in structure. Moderate mitral valve  regurgitation. No evidence of mitral stenosis.  5. The aortic valve is normal in structure. Aortic valve regurgitation is  not visualized. Mild to moderate aortic valve sclerosis/calcification is  present, without any evidence of aortic stenosis.   Patient Profile     87 y.o. female with history of CKD stage III, DM, HTN, HLD, anemia, and cervical radiculopathywho is a Jehovah's witness andis being seen today for the evaluation ofpulmonary edema.  Assessment & Plan    Acute respiratory failure with hypoxia AOC HFpEF Pulmonary HTN --No SOB. SOB thought likely multifactorial as previously noted and exacerbated by underlying anemia and hypoalbuminemia.  --Echo as above, nl EF. RVSP 48.12mmHg. --Net -6.0L for admission and -282cc yesterday. Continue to monitor I/Os. --IV lasix held yesterday due to Cr bump. Less output with hold of diuresis. --Cr slightly improved 2.39  2.18 with baseline Cr 1.8. Daily Wts. --Wt 68  70kg, though unclear if accurate as noted in past. --Nephrology now following, given bump in Cr with diuresis.  --Per nephrology, recommend fluid restriction, diuresis still on hold. --Consider RHC +/- OP PHTN clinic referral. --ReDS vest. Trend BNP. --CHF education provided.  Elevated HS Tn, Supply demand ischemia --HS Tn elevated in the setting of the above and most consistent with supply demand ischemia due to comorbids. No plan for further ischemic workup. F/u at RTC.  HTN --Controlled at 129/53. --Continue current medications, including amlodipine, hydralazine, and bystolic. --No ACE/ARB/ARNI.  Anemia --Daily CBC. S/p Fe infusion. Jehovah's witness. Nephrology recommends Epo supplementation.  Hypokalemia --Continue K+ supplementation.   AOCKD --Daily BMET. --Nephrology following. --Baseline Cr 1.8 and  thought 2/2 DM nephropathy, though still pending complete workup. --Renal US with findings that might indicate renal dz. --Cr improved overnight after holding lasix.  --Recommendations per nephrology.  DM2 --SSI, per IM.    For questions or updates, please contact [place] Please consult [email or web address] for contact info under        Signed, Arvil Chaco, PA-C  03/27/2020, 11:40 AM

## 2020-03-27 NOTE — Progress Notes (Addendum)
Weened gradually from 3L to RA throughout day, remained SPO2 remained 96-100% at rest >3 hours.  Patient ambulated 80 feet, SPO2% remains 97-100% on room air for duration.  Unable to ambulate further due to weakness.  Denied SOB during ambulation.

## 2020-03-27 NOTE — Progress Notes (Signed)
[facility]  PROGRESS NOTE   Subjective:   Patient seen at bedside, out of bed to chair. Feels much better this morning Had breakfast  Objective:  Vital signs in last 24 hours:  Temp:  [97.1 F (36.2 C)-98.3 F (36.8 C)] 98.3 F (36.8 C) (03/13 0842) Pulse Rate:  [65-71] 67 (03/13 0842) Resp:  [14-21] 15 (03/13 0842) BP: (129-158)/(53-68) 129/53 (03/13 0842) SpO2:  [94 %-100 %] 98 % (03/13 0842) Weight:  [70.3 kg] 70.3 kg (03/13 0129)  Weight change: 1.7 kg Filed Weights   03/25/20 0500 03/26/20 0500 03/27/20 0129  Weight: 69.5 kg 68.6 kg 70.3 kg    Intake/Output: I/O last 3 completed shifts: In: 118 [P.O.:118] Out: 1525 [Urine:1525]   Intake/Output this shift:  Total I/O In: 240 [P.O.:240] Out: -   Physical Exam: General:  No acute distress  Head:  Normocephalic, atraumatic. Moist oral mucosal membranes  Eyes:  Anicteric  Neck:  Supple  Lungs:   Clear to auscultation, normal effort  Heart:  S1S2 no rubs  Abdomen:   Soft, nontender, bowel sounds present  Extremities:  peripheral edema.  Neurologic:  Awake, alert, following commands  Skin:  No lesions  Access:     Basic Metabolic Panel: Recent Labs  Lab 03/21/20 0622 03/22/20 0502 03/22/20 1121 03/23/20 0705 03/24/20 0420 03/25/20 0510 03/26/20 0452 03/27/20 0831  NA 141 143 145 143 143 138 135  --   K 4.0 3.4* 4.0 4.9 4.6 3.1* 3.8  --   CL 109 110 110 113* 106 101 101  --   CO2 23 20* 23 16* 27 27 24   --   GLUCOSE 101* 129* 122* 128* 87 124* 107*  --   BUN 31* 36* 38* 42* 41* 46* 56*  --   CREATININE 1.85* 2.01* 1.89* 2.40* 2.21* 2.26* 2.39* 2.18*  CALCIUM 8.5* 8.3* 8.7* 8.7* 8.8* 8.5* 8.5*  --   MG 2.0 2.0  --  2.1 2.1 1.8 2.4  --   PHOS 4.4 4.1  --   --  3.9  --   --   --     Liver Function Tests: Recent Labs  Lab 03/23/20 0705  ALBUMIN 2.5*   No results for input(s): LIPASE, AMYLASE in the last 168 hours. No results for input(s): AMMONIA in the last 168  hours.  CBC: Recent Labs  Lab 03/21/20 0622 03/22/20 0502 03/23/20 0705 03/25/20 0510  WBC 15.3* 13.3* 13.5* 10.4  HGB 7.9* 7.3* 7.6* 8.5*  HCT 24.8* 21.8* 22.6* 27.0*  MCV 79.7* 77.0* 77.4* 80.1  PLT 360 352 324 400    Cardiac Enzymes: No results for input(s): CKTOTAL, CKMB, CKMBINDEX, TROPONINI in the last 168 hours.  BNP: Invalid input(s): POCBNP  CBG: Recent Labs  Lab 03/26/20 0736 03/26/20 1218 03/26/20 1632 03/26/20 1914 03/27/20 0825  GLUCAP 104* 211* 169* 140* 129*    Microbiology: Results for orders placed or performed during the hospital encounter of 03/19/20  Blood culture (routine x 2)     Status: None   Collection Time: 03/19/20  8:14 AM   Specimen: BLOOD  Result Value Ref Range Status   Specimen Description BLOOD RIGHT FOREARM  Final   Special Requests   Final    BOTTLES DRAWN AEROBIC AND ANAEROBIC Blood Culture results may not be optimal due to an excessive volume of blood received in culture bottles   Culture   Final    NO GROWTH 5 DAYS Performed at [facility], [address] [place], [place] [address]    Report Status 03/24/2020 FINAL  Final  Blood culture (routine x 2)     Status: None   Collection Time: 03/19/20  8:14 AM   Specimen: BLOOD  Result Value Ref Range Status   Specimen Description BLOOD RIGHT HAND  Final   Special Requests   Final    BOTTLES DRAWN AEROBIC AND ANAEROBIC Blood Culture adequate volume   Culture   Final    NO GROWTH 5 DAYS Performed at [facility], [address] [place], [place] [address]    Report Status 03/24/2020 FINAL  Final  Resp Panel by RT-PCR (Flu A&B, Covid) Nasopharyngeal Swab     Status: None   Collection Time: 03/19/20  8:14 AM   Specimen: Nasopharyngeal Swab; Nasopharyngeal(NP) swabs in vial transport medium  Result Value Ref Range Status   SARS Coronavirus 2 by RT PCR NEGATIVE NEGATIVE Final    Comment: (NOTE) SARS-CoV-2 target nucleic acids are NOT DETECTED.  The  SARS-CoV-2 RNA is generally detectable in upper respiratory specimens during the acute phase of infection. The lowest concentration of SARS-CoV-2 viral copies this assay can detect is 138 copies/mL. A negative result does not preclude SARS-Cov-2 infection and should not be used as the sole basis for treatment or other patient management decisions. A negative result may occur with  improper specimen collection/handling, submission of specimen other than nasopharyngeal swab, presence of viral mutation(s) within the areas targeted by this assay, and inadequate number of viral copies(<138 copies/mL). A negative result must be combined with clinical observations, patient history, and epidemiological information. The expected result is Negative.  Fact Sheet for Patients:  EntrepreneurPulse.com.au  Fact Sheet for Healthcare Providers:  IncredibleEmployment.be  This test is no t yet approved or cleared by the [place] FDA and  has been authorized for detection and/or diagnosis of SARS-CoV-2 by FDA under an Emergency Use Authorization (EUA). This EUA will remain  in effect (meaning this test can be used) for the duration of the COVID-19 declaration under Section 564(b)(1) of the Act, 21 U.S.C.section 360bbb-3(b)(1), unless the authorization is terminated  or revoked sooner.       Influenza A by PCR NEGATIVE NEGATIVE Final   Influenza B by PCR NEGATIVE NEGATIVE Final    Comment: (NOTE) The Xpert Xpress SARS-CoV-2/FLU/RSV plus assay is intended as an aid in the diagnosis of influenza from Nasopharyngeal swab specimens and should not be used as a sole basis for treatment. Nasal washings and aspirates are unacceptable for Xpert Xpress SARS-CoV-2/FLU/RSV testing.  Fact Sheet for Patients: EntrepreneurPulse.com.au  Fact Sheet for Healthcare Providers: IncredibleEmployment.be  This test is not yet approved or  cleared by the [place] FDA and has been authorized for detection and/or diagnosis of SARS-CoV-2 by FDA under an Emergency Use Authorization (EUA). This EUA will remain in effect (meaning this test can be used) for the duration of the COVID-19 declaration under Section 564(b)(1) of the Act, 21 U.S.C. section 360bbb-3(b)(1), unless the authorization is terminated or revoked.  Performed at [facility], [address] [place],  [address]   MRSA PCR Screening     Status: None   Collection Time: 03/19/20 11:17 AM   Specimen: Nasopharyngeal  Result Value Ref Range Status   MRSA by PCR NEGATIVE NEGATIVE Final    Comment:        The GeneXpert MRSA Assay (FDA approved for NASAL specimens only), is one component of a comprehensive MRSA colonization surveillance program. It is not  intended to diagnose MRSA infection nor to guide or monitor treatment for MRSA infections. Performed at [facility], [address] [place], [place] [address]     Coagulation Studies: No results for input(s): LABPROT, INR in the last 72 hours.  Urinalysis: No results for input(s): COLORURINE, LABSPEC, PHURINE, GLUCOSEU, HGBUR, BILIRUBINUR, KETONESUR, PROTEINUR, UROBILINOGEN, NITRITE, LEUKOCYTESUR in the last 72 hours.  Invalid input(s): APPERANCEUR    Imaging: No results found.   Medications:   . sodium chloride Stopped (03/20/20 1603)   . amLODipine  10 mg Oral Daily  . famotidine  20 mg Oral Daily  . heparin  5,000 Units Subcutaneous Q8H  . hydrALAZINE  25 mg Oral Q8H  . insulin aspart  0-15 Units Subcutaneous TID WC  . insulin aspart  0-5 Units Subcutaneous QHS  . insulin glargine  5 Units Subcutaneous QHS  . mouth rinse  15 mL Mouth Rinse BID  . nebivolol  10 mg Oral Daily  . sodium chloride flush  3 mL Intravenous Q12H   sodium chloride, acetaminophen, dextrose, docusate sodium, hydrALAZINE, loperamide, ondansetron (ZOFRAN) IV, polyethylene glycol,  sodium chloride flush  Assessment/ Plan:  Sheri Ryan a 87 y.o.black Jehovah witness femalewith diabetes mellitus type II, diabetic retinopathy, hypertension, left eye blindness, congestive heart failure, who is admitted to [facility] on 3/5/2022for Respiratory failure ([place]) [J96.90] Acute pulmonary edema ([place]) [J81.0] Acute respiratory failure with hypoxia ([place]) [J96.01] Diabetic ketoacidosis without coma associated with type 2 diabetes mellitus ([place]) [E11.10] Anemia, unspecified type [D64.9]  1. Acute kidney injury on chronic kidney disease stage IIIB with nephrotic range proteinuria. (>4.4 grams) Baseline creatinine of 1.8, GFR of 32 on 02/19/2020.  Chronic kidney disease thought to be secondary to diabetic nephropathy although serologic work up not complete.  ANA negative. SPEP/UPEP negative.ANCA negative, Anti-GBM negative. Complements normal Holding home lisinopril dose.   2. Acute exacerbation of congestive heart failure: echocardiogram on 3/6 with preserved systolic function.  - IV furosemide now on hold - low salt diet - fluid restriction  3. Hypertension: home regimen of lisinopril, amlodipine and nebivolol.  - Continue nebivolol and amlodipine.   4. Anemia with kidney failure: hemoglobin8.35microcytic. With iron deficiency. 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  - IV iron.  Needs hematology evaluation as outpatient. Will need erythropoietin supplementation.  5. Diabetes mellitus type II with chronic kidney disease: noninsulin dependent at baseline. Currently on insulin glargine and sliding scale. Hemoglobin A1c of 7.5% on 02/19/20.  Not currently on an SGLT-2 inhibitor.  6. Hypokalemia: secondary to IV furosemide - PO potassium chloride  Will continue fluid restriction and hold the diuretics for now And follow closely. Will check labs in a.m.      LOS: 8 Karson Chicas 3/13/202211:24 AM

## 2020-03-27 NOTE — Consult Note (Signed)
[facility] for Electrolyte Monitoring and Replacement   Recent Labs: Potassium (mmol/L)  Date Value  03/26/2020 3.8   Magnesium (mg/dL)  Date Value  03/26/2020 2.4   Calcium (mg/dL)  Date Value  03/26/2020 8.5 (L)   Albumin (g/dL)  Date Value  03/23/2020 2.5 (L)  07/31/2019 3.9   Phosphorus (mg/dL)  Date Value  03/24/2020 3.9   Sodium (mmol/L)  Date Value  03/26/2020 135  07/31/2019 137    Assessment: 87 yo F presented with acute respiratory failure likely due to pulmonary edema.Pt with low hemoglobin, unable to transfuse for religious reasons. Nephrology consulted for worsening metabolic acidosis and anemia in setting of CKD with elevated serum creatinine.    Diuretics: furosemide 80 mg IV BID stopped 3/12  Goal of Therapy:  Potassium 4.0 - 5.1 mmol/L Magnesium 2.0 - 2.4 mg/dL All Other Electrolytes WNL  Plan (conservative given renal function):   On potassium 40 mEq daily given previous diuresis. Will hold further dosing while holding diuresis.  Continue to follow along.  Tawnya Crook, PharmD Clinical Pharmacist 03/27/2020 8:11 AM

## 2020-03-28 DIAGNOSIS — J810 Acute pulmonary edema: Secondary | ICD-10-CM | POA: Diagnosis not present

## 2020-03-28 DIAGNOSIS — N189 Chronic kidney disease, unspecified: Secondary | ICD-10-CM

## 2020-03-28 DIAGNOSIS — J9601 Acute respiratory failure with hypoxia: Secondary | ICD-10-CM | POA: Diagnosis not present

## 2020-03-28 DIAGNOSIS — N179 Acute kidney failure, unspecified: Secondary | ICD-10-CM | POA: Diagnosis not present

## 2020-03-28 LAB — CBC WITH DIFFERENTIAL/PLATELET
Abs Immature Granulocytes: 0.12 10*3/uL — ABNORMAL HIGH (ref 0.00–0.07)
Basophils Absolute: 0.1 10*3/uL (ref 0.0–0.1)
Basophils Relative: 1 %
Eosinophils Absolute: 0.4 10*3/uL (ref 0.0–0.5)
Eosinophils Relative: 4 %
HCT: 29.2 % — ABNORMAL LOW (ref 36.0–46.0)
Hemoglobin: 9.2 g/dL — ABNORMAL LOW (ref 12.0–15.0)
Immature Granulocytes: 1 %
Lymphocytes Relative: 32 %
Lymphs Abs: 3.5 10*3/uL (ref 0.7–4.0)
MCH: 25.3 pg — ABNORMAL LOW (ref 26.0–34.0)
MCHC: 31.5 g/dL (ref 30.0–36.0)
MCV: 80.4 fL (ref 80.0–100.0)
Monocytes Absolute: 0.7 10*3/uL (ref 0.1–1.0)
Monocytes Relative: 7 %
Neutro Abs: 6.1 10*3/uL (ref 1.7–7.7)
Neutrophils Relative %: 55 %
Platelets: 371 10*3/uL (ref 150–400)
RBC: 3.63 MIL/uL — ABNORMAL LOW (ref 3.87–5.11)
RDW: 16.4 % — ABNORMAL HIGH (ref 11.5–15.5)
WBC: 11 10*3/uL — ABNORMAL HIGH (ref 4.0–10.5)
nRBC: 0 % (ref 0.0–0.2)

## 2020-03-28 LAB — GLUCOSE, CAPILLARY
Glucose-Capillary: 133 mg/dL — ABNORMAL HIGH (ref 70–99)
Glucose-Capillary: 183 mg/dL — ABNORMAL HIGH (ref 70–99)
Glucose-Capillary: 238 mg/dL — ABNORMAL HIGH (ref 70–99)
Glucose-Capillary: 128 mg/dL — ABNORMAL HIGH (ref 70–99)

## 2020-03-28 LAB — COMPREHENSIVE METABOLIC PANEL
ALT: 30 U/L (ref 0–44)
AST: 31 U/L (ref 15–41)
Albumin: 3 g/dL — ABNORMAL LOW (ref 3.5–5.0)
Alkaline Phosphatase: 63 U/L (ref 38–126)
Anion gap: 10 (ref 5–15)
BUN: 77 mg/dL — ABNORMAL HIGH (ref 8–23)
CO2: 25 mmol/L (ref 22–32)
Calcium: 9.1 mg/dL (ref 8.9–10.3)
Chloride: 100 mmol/L (ref 98–111)
Creatinine, Ser: 3.32 mg/dL — ABNORMAL HIGH (ref 0.44–1.00)
GFR, Estimated: 13 mL/min — ABNORMAL LOW (ref >60)
Glucose, Bld: 198 mg/dL — ABNORMAL HIGH (ref 70–99)
Potassium: 4.9 mmol/L (ref 3.5–5.1)
Sodium: 135 mmol/L (ref 135–145)
Total Bilirubin: 0.6 mg/dL (ref 0.3–1.2)
Total Protein: 8 g/dL (ref 6.5–8.1)

## 2020-03-28 MED ORDER — SENNOSIDES-DOCUSATE SODIUM 8.6-50 MG PO TABS
2.0000 | ORAL_TABLET | Freq: Two times a day (BID) | ORAL | Status: DC
  Administered 2020-03-28 – 2020-03-29 (×3): 2 via ORAL
  Filled 2020-03-28 (×5): qty 2

## 2020-03-28 MED ORDER — INSULIN ASPART 100 UNIT/ML ~~LOC~~ SOLN
2.0000 [IU] | Freq: Three times a day (TID) | SUBCUTANEOUS | Status: DC
  Administered 2020-03-28 – 2020-03-29 (×4): 2 [IU] via SUBCUTANEOUS
  Filled 2020-03-28 (×5): qty 1

## 2020-03-28 MED ORDER — ALUM & MAG HYDROXIDE-SIMETH 200-200-20 MG/5ML PO SUSP
30.0000 mL | Freq: Four times a day (QID) | ORAL | Status: DC | PRN
  Administered 2020-03-28: 30 mL via ORAL
  Filled 2020-03-28: qty 30

## 2020-03-28 NOTE — Progress Notes (Signed)
Inpatient Diabetes Program Recommendations  AACE/ADA: New Consensus Statement on Inpatient Glycemic Control   Target Ranges:  Prepandial:   less than 140 mg/dL      Peak postprandial:   less than 180 mg/dL (1-2 hours)      Critically ill patients:  140 - 180 mg/dL  Results for MERRILL, DEANDA (MRN [id number]) as of 03/28/2020 09:25  Ref. Range 03/27/2020 08:25 03/27/2020 12:08 03/27/2020 16:34 03/27/2020 21:02 03/28/2020 07:45  Glucose-Capillary Latest Ref Range: 70 - 99 mg/dL 129 (H) 214 (H) 189 (H) 166 (H) 133 (H)    Review of Glycemic Control  Current orders for Inpatient glycemic control: Lantus 5 units QHS, Novolog 0-15 units TID with meals, Novolog 0-5 units QHS  Inpatient Diabetes Program Recommendations:    Insulin: Please consider ordering Novolog 2 units TID with meals for meal coverage if patient eats at least 50% of meals.  Thanks, Barnie Alderman, RN, MSN, CDE Diabetes Coordinator Inpatient Diabetes Program [phone] (Team Pager from 8am to 5pm)

## 2020-03-28 NOTE — Progress Notes (Addendum)
[facility] [place] NAME: Sheri Ryan    MR#:  [id number]  DATE OF BIRTH:  1929/03/27  SUBJECTIVE:  Sitting in the chair.  Felt very lightheaded upon ambulation with physical therapy.  Was having some chest pressure which she felt could be from gas.  She and her son does not feel safe for her discharge today REVIEW OF SYSTEMS:   Review of Systems  Constitutional: Negative for chills, fever and weight loss.  HENT: Negative for ear discharge, ear pain and nosebleeds.   Eyes: Negative for blurred vision, pain and discharge.  Respiratory: Positive for shortness of breath. Negative for sputum production, wheezing and stridor.   Cardiovascular: Positive for chest pain. Negative for palpitations, orthopnea and PND.  Gastrointestinal: Positive for constipation. Negative for abdominal pain, diarrhea, nausea and vomiting.  Genitourinary: Negative for frequency and urgency.  Musculoskeletal: Negative for back pain and joint pain.  Neurological: Positive for dizziness and weakness. Negative for sensory change, speech change and focal weakness.  Psychiatric/Behavioral: Negative for depression and hallucinations. The patient is not nervous/anxious.    Tolerating Diet:yes Tolerating PT: HHPT  DRUG ALLERGIES:  No Known Allergies  VITALS:  Blood pressure (!) 104/58, pulse 77, temperature 97.6 F (36.4 C), temperature source Oral, resp. rate 17, height 5\' 5"  (1.651 m), weight 70.3 kg, SpO2 100 %.  PHYSICAL EXAMINATION:   Physical Exam  GENERAL:  87 y.o.-year-old patient lying in the bed with no acute distress.   LUNGS: decreased breath sounds bilaterally, no wheezing, rales, rhonchi. No use of accessory muscles of respiration.  CARDIOVASCULAR: S1, S2 normal. No murmurs, rubs, or gallops.  ABDOMEN: Soft, nontender, nondistended. Bowel sounds present. No organomegaly or mass.  EXTREMITIES: No cyanosis, clubbing or edema b/l.    NEUROLOGIC: Cranial  nerves II through XII are intact. No focal Motor or sensory deficits b/l.   PSYCHIATRIC:  patient is alert and oriented x 3.  SKIN: No obvious rash, lesion, or ulcer.   LABORATORY PANEL:  CBC Recent Labs  Lab 03/28/20 1130  WBC 11.0*  HGB 9.2*  HCT 29.2*  PLT 371    Chemistries  Recent Labs  Lab 03/26/20 0452 03/27/20 0831 03/28/20 1130  NA 135   < > 135  K 3.8   < > 4.9  CL 101   < > 100  CO2 24   < > 25  GLUCOSE 107*   < > 198*  BUN 56*   < > 77*  CREATININE 2.39*   < > 3.32*  CALCIUM 8.5*   < > 9.1  MG 2.4  --   --   AST  --   --  31  ALT  --   --  30  ALKPHOS  --   --  63  BILITOT  --   --  0.6   < > = values in this interval not displayed.   Cardiac Enzymes No results for input(s): TROPONINI in the last 168 hours. RADIOLOGY:  No results found. ASSESSMENT AND PLAN:  Cornella Emmer is a 87 y.o. female brought in by EMS on CPAP for shortness of breath/respiratory failure.  They report room air saturation was 80% upon arrival, no reports of fevers.  Acute hypoxic respiratory failure secondary to pulmonary edema/pulmonary hypertension with underlying chronic anemia and mitral regurgitation Acute on chronic diastolic dysfunction Sepsis ruled out -- patient was placed on BiPAP--> high flow nasal cannula oxygen--> 2 liter [place] sats 100%-->98% on  1 liters [place]--now on room air -- Holding Lasix due to rising creat. -- appreciate [facility] cardiology input.per Dr Rockey Situ no Plans for right heart catheterization cont current mnx and f/u cardio as out pt -- Patient is improving slowly. Good urine output.  Elevated troponin in the setting of congestive heart failure/pulmonary edema -- initial troponin 34--- 491 -- no heparin given underlying anemia -- echo shows preserved LV systolic function.  Acute on CKD stage IIIb -- baseline creatinine 1.6 -- 1.8 --creat 2.26--2.39-> 3.3 -Lasix ordered to resume today but will hold with worsening kidney function -Nephrology  following  Anemia of chronic disease -- hemoglobin stable at 9.2 -- she is Jehovah's Witness  Hypertension-- home meds are lisinopril, amlodipine, Nebivolo --currently on nebivolol, amlodipine and hydralazine  Diabetes type II with chronic kidney disease non-insulin-dependent -- currently continue Lantus--consider resume glipizide lower dose -- Hemoglobin 8.5 on 3/11 Add NovoLog 2 units subcu with each meal  Constipation/gas pain Will start Maalox as needed and bowel regimen  Procedures: Family communication : son [name] on the phone on 3/14 Consults : cardiology, nephrology CODE STATUS: partial, DNI DVT Prophylaxis : heparin Level of care: Med-Surg Status is: Inpatient  Remains inpatient appropriate because:Inpatient level of care appropriate due to severity of illness   Dispo: The patient is from: Home              Anticipated d/c is to: Home llikely 3/[facility] with [facility]              Patient currently is medically improving   Difficult to place patient No   Considering her kidney function is continuing to get worse, feeling lightheaded and having some chest pressure I will hold off discharge today.  Potential discharge home tomorrow with home health PT OT and RN. Patient will follow-up with nephrology and cardiology as outpatient    TOTAL TIME TAKING CARE OF THIS PATIENT: 25 minutes.  >50% time spent on counselling and coordination of care  Note: This dictation was prepared with Dragon dictation along with smaller phrase technology. Any transcriptional errors that result from this process are unintentional.  Max Sane M.D    [facility]   CC: Primary care physician; Glean Hess, MDPatient ID: [name], female   DOB: 23-Oct-1929, 87 y.o.   MRN: [id number]

## 2020-03-28 NOTE — Progress Notes (Signed)
Physical Therapy Treatment Patient Details Name: Sheri Ryan MRN: [id number] DOB: 19-Sep-1929 Today's Date: 03/28/2020    History of Present Illness 87 yo AAF with Severe ACUTE Hypoxic and Hypercapnic Respiratory Failure due to bilateral infiltrates with acute bacterial/viral pneumonia with findings of acute S CHF exacerbation with acute on chronic renal failure with acidosis and DKA. PMH of Blind left eye, Cataract, Chronic kidney disease, Diabetes mellitus without complication ([place]), Hypertension, and Retinopathy.    PT Comments    Pt seen for PT evaluation with nurse present in room noting pt c/o feeling "bloated" with tightness across stomach with MD planning to order medication. Pt agreeable to tx with encouragement. Pt begins to ambulate with RW & supervision but once at door to room pt notes feeling lightheaded so provided CGA & pt assisted back to sitting in recliner. Pt found to have orthostatic hypotension with sit>stand so pt performed BLE strengthening exercises as noted below; encouraged pt to perform exercises on her own throughout the day. Will continue to follow pt acutely to progress gait, strengthening & endurance.   Sitting in recliner: BP 129/58 mmHg in RUE  Standing: BP 104/58 mmHg in RUE  SpO2 >90% on room air    Follow Up Recommendations  Home health PT;Supervision - Intermittent     Equipment Recommendations  Rolling walker with 5" wheels    Recommendations for Other Services       Precautions / Restrictions Precautions Precautions: Fall Restrictions Weight Bearing Restrictions: No    Mobility  Bed Mobility               General bed mobility comments: not observed as pt received/left sitting in recliner    Transfers Overall transfer level: Needs assistance   Transfers: Sit to/from Stand Sit to Stand: Supervision         General transfer comment: cuing for hand placement to push to standing vs pulling on  RW  Ambulation/Gait Ambulation/Gait assistance: Supervision;Min guard Gait Distance (Feet): 35 Feet Assistive device: Rolling walker (2 wheeled) Gait Pattern/deviations: Decreased step length - left;Decreased step length - right;Decreased stride length Gait velocity: decreased       Stairs             Wheelchair Mobility    Modified Rankin (Stroke Patients Only)       Balance Overall balance assessment: Needs assistance Sitting-balance support: No upper extremity supported;Feet supported Sitting balance-Leahy Scale: Good     Standing balance support: Bilateral upper extremity supported Standing balance-Leahy Scale: Fair                              Cognition Arousal/Alertness: Awake/alert Behavior During Therapy: WFL for tasks assessed/performed Overall Cognitive Status: Within Functional Limits for tasks assessed                                 General Comments: patient able to follow commands without difficulty, very pleasant throughout session      Exercises General Exercises - Lower Extremity Long Arc Quad: AROM;Strengthening;Right;Left;10 reps;Seated Hip ABduction/ADduction: AROM;Strengthening;Right;Left;10 reps;Seated (hip adduction pillow squeezes) Hip Flexion/Marching: AROM;Strengthening;Right;Left;Seated;10 reps    General Comments        Pertinent Vitals/Pain Pain Assessment: No/denies pain    Home Living                      Prior Function  PT Goals (current goals can now be found in the care plan section) Acute Rehab PT Goals Patient Stated Goal: to go home PT Goal Formulation: With patient Time For Goal Achievement: 04/07/20 Potential to Achieve Goals: Good Progress towards PT goals: Progressing toward goals    Frequency    Min 2X/week      PT Plan Current plan remains appropriate    Co-evaluation              AM-PAC PT "6 Clicks" Mobility   Outcome Measure  Help  needed turning from your back to your side while in a flat bed without using bedrails?: None Help needed moving from lying on your back to sitting on the side of a flat bed without using bedrails?: A Little Help needed moving to and from a bed to a chair (including a wheelchair)?: A Little Help needed standing up from a chair using your arms (e.g., wheelchair or bedside chair)?: A Little Help needed to walk in hospital room?: A Little Help needed climbing 3-5 steps with a railing? : A Lot 6 Click Score: 18    End of Session Equipment Utilized During Treatment: Gait belt Activity Tolerance:  (limited 2/2 feeling lightheaded) Patient left: in chair;with call bell/phone within reach;with nursing/sitter in room;with chair alarm set   PT Visit Diagnosis: Muscle weakness (generalized) (M62.81);Unsteadiness on feet (R26.81)     Time: 3235-5732 PT Time Calculation (min) (ACUTE ONLY): 16 min  Charges:  $Therapeutic Activity: 8-22 mins                     Lavone Nian, PT, DPT 03/28/20, 2:47 PM    Waunita Schooner 03/28/2020, 2:43 PM

## 2020-03-28 NOTE — Progress Notes (Signed)
[facility]  ROUNDING NOTE   Subjective:   [name] was admitted to [facility] on 03/19/2020 for Respiratory failure [facility]) [J96.90] Acute pulmonary edema ([place]) [J81.0] Acute respiratory failure with hypoxia ([place]) [J96.01] Diabetic ketoacidosis without coma associated with type 2 diabetes mellitus ([place]) [E11.10] Anemia, unspecified type [D64.9]  UOP 2630mL - furosemide 80mg  IV bid  Patient sitting in chair at bedside Alert and oriented Able to tolerate meals Denies nausea Denies shortness of breath except with exertion.    Objective:  Vital signs in last 24 hours:  Temp:  [97.4 F (36.3 C)-98.6 F (37 C)] 97.6 F (36.4 C) (03/14 1416) Pulse Rate:  [66-77] 77 (03/14 1431) Resp:  [16-18] 17 (03/14 1416) BP: (104-148)/(42-64) 104/58 (03/14 1431) SpO2:  [97 %-100 %] 100 % (03/14 1431)  Weight change:  Filed Weights   03/25/20 0500 03/26/20 0500 03/27/20 0129  Weight: 69.5 kg 68.6 kg 70.3 kg    Intake/Output: I/O last 3 completed shifts: In: 718 [P.O.:718] Out: 100 [Urine:100]   Intake/Output this shift:  Total I/O In: 480 [P.O.:480] Out: -   Physical Exam: General: No acute distress, sitting in chair  Head: Normocephalic, moist oral mus[place]a  Eyes: Anicteric, PERRL  Neck: Supple, trachea midline  Lungs:  Clear, normal breathing  Heart: Regular rate and rhythm  Abdomen:  Soft, nontender  Extremities: no peripheral edema.  Neurologic: Nonfocal, moving all four extremities  Skin: No lesions       Basic Metabolic Panel: Recent Labs  Lab 03/22/20 0502 03/22/20 1121 03/23/20 0705 03/24/20 0420 03/25/20 0510 03/26/20 0452 03/27/20 0831 03/27/20 1156 03/28/20 1130  NA 143   < > 143 143 138 135  --  134* 135  K 3.4*   < > 4.9 4.6 3.1* 3.8  --  4.9 4.9  CL 110   < > 113* 106 101 101  --  100 100  CO2 20*   < > 16* 27 27 24   --  21* 25  GLUCOSE 129*   < > 128* 87 124* 107*  --  213* 198*  BUN 36*   < > 42* 41* 46* 56*  --  63*  77*  CREATININE 2.01*   < > 2.40* 2.21* 2.26* 2.39* 2.18* 2.35* 3.32*  CALCIUM 8.3*   < > 8.7* 8.8* 8.5* 8.5*  --  8.7* 9.1  MG 2.0  --  2.1 2.1 1.8 2.4  --   --   --   PHOS 4.1  --   --  3.9  --   --   --   --   --    < > = values in this interval not displayed.    Liver Function Tests: Recent Labs  Lab 03/23/20 0705 03/28/20 1130  AST  --  31  ALT  --  30  ALKPHOS  --  63  BILITOT  --  0.6  PROT  --  8.0  ALBUMIN 2.5* 3.0*   No results for input(s): LIPASE, AMYLASE in the last 168 hours. No results for input(s): AMMONIA in the last 168 hours.  CBC: Recent Labs  Lab 03/22/20 0502 03/23/20 0705 03/25/20 0510 03/28/20 1130  WBC 13.3* 13.5* 10.4 11.0*  NEUTROABS  --   --   --  6.1  HGB 7.3* 7.6* 8.5* 9.2*  HCT 21.8* 22.6* 27.0* 29.2*  MCV 77.0* 77.4* 80.1 80.4  PLT 352 324 400 371    Cardiac Enzymes: No results for input(s): CKTOTAL, CKMB, CKMBINDEX,  TROPONINI in the last 168 hours.  BNP: Invalid input(s): POCBNP  CBG: Recent Labs  Lab 03/27/20 1208 03/27/20 1634 03/27/20 2102 03/28/20 0745 03/28/20 1131  GLUCAP 214* 189* 166* 133* 183*    Microbiology: Results for orders placed or performed during the hospital encounter of 03/19/20  Blood culture (routine x 2)     Status: None   Collection Time: 03/19/20  8:14 AM   Specimen: BLOOD  Result Value Ref Range Status   Specimen Description BLOOD RIGHT FOREARM  Final   Special Requests   Final    BOTTLES DRAWN AEROBIC AND ANAEROBIC Blood Culture results may not be optimal due to an excessive volume of blood received in culture bottles   Culture   Final    NO GROWTH 5 DAYS Performed at [facility], [address], [place], [place] [address]    Report Status 03/24/2020 FINAL  Final  Blood culture (routine x 2)     Status: None   Collection Time: 03/19/20  8:14 AM   Specimen: BLOOD  Result Value Ref Range Status   Specimen Description BLOOD RIGHT HAND  Final   Special Requests   Final     BOTTLES DRAWN AEROBIC AND ANAEROBIC Blood Culture adequate volume   Culture   Final    NO GROWTH 5 DAYS Performed at [facility], [address] [place], [place] [address]    Report Status 03/24/2020 FINAL  Final  Resp Panel by RT-PCR (Flu A&B, Covid) Nasopharyngeal Swab     Status: None   Collection Time: 03/19/20  8:14 AM   Specimen: Nasopharyngeal Swab; Nasopharyngeal(NP) swabs in vial transport medium  Result Value Ref Range Status   SARS Coronavirus 2 by RT PCR NEGATIVE NEGATIVE Final    Comment: (NOTE) SARS-CoV-2 target nucleic acids are NOT DETECTED.  The SARS-CoV-2 RNA is generally detectable in upper respiratory specimens during the acute phase of infection. The lowest concentration of SARS-CoV-2 viral copies this assay can detect is 138 copies/mL. A negative result does not preclude SARS-Cov-2 infection and should not be used as the sole basis for treatment or other patient management decisions. A negative result may occur with  improper specimen collection/handling, submission of specimen other than nasopharyngeal swab, presence of viral mutation(s) within the areas targeted by this assay, and inadequate number of viral copies(<138 copies/mL). A negative result must be combined with clinical observations, patient history, and epidemiological information. The expected result is Negative.  Fact Sheet for Patients:  EntrepreneurPulse.com.au  Fact Sheet for Healthcare Providers:  IncredibleEmployment.be  This test is no t yet approved or cleared by the [place] FDA and  has been authorized for detection and/or diagnosis of SARS-CoV-2 by FDA under an Emergency Use Authorization (EUA). This EUA will remain  in effect (meaning this test can be used) for the duration of the COVID-19 declaration under Section 564(b)(1) of the Act, 21 U.S.C.section 360bbb-3(b)(1), unless the authorization is terminated  or revoked sooner.        Influenza A by PCR NEGATIVE NEGATIVE Final   Influenza B by PCR NEGATIVE NEGATIVE Final    Comment: (NOTE) The Xpert Xpress SARS-CoV-2/FLU/RSV plus assay is intended as an aid in the diagnosis of influenza from Nasopharyngeal swab specimens and should not be used as a sole basis for treatment. Nasal washings and aspirates are unacceptable for Xpert Xpress SARS-CoV-2/FLU/RSV testing.  Fact Sheet for Patients: EntrepreneurPulse.com.au  Fact Sheet for Healthcare Providers: IncredibleEmployment.be  This test is not yet approved or  cleared by the [place] and has been authorized for detection and/or diagnosis of SARS-CoV-2 by FDA under an Emergency Use Authorization (EUA). This EUA will remain in effect (meaning this test can be used) for the duration of the COVID-19 declaration under Section 564(b)(1) of the Act, 21 U.S.C. section 360bbb-3(b)(1), unless the authorization is terminated or revoked.  Performed at [facility], [address] [place], [place] [address]   MRSA PCR Screening     Status: None   Collection Time: 03/19/20 11:17 AM   Specimen: Nasopharyngeal  Result Value Ref Range Status   MRSA by PCR NEGATIVE NEGATIVE Final    Comment:        The GeneXpert MRSA Assay (FDA approved for NASAL specimens only), is one component of a comprehensive MRSA colonization surveillance program. It is not intended to diagnose MRSA infection nor to guide or monitor treatment for MRSA infections. Performed at [facility], [address] [place], [place] [address]     Coagulation Studies: No results for input(s): LABPROT, INR in the last 72 hours.  Urinalysis: No results for input(s): COLORURINE, LABSPEC, PHURINE, GLUCOSEU, HGBUR, BILIRUBINUR, KETONESUR, PROTEINUR, UROBILINOGEN, NITRITE, LEUKOCYTESUR in the last 72 hours.  Invalid input(s): APPERANCEUR    Imaging: No results  found.   Medications:   . sodium chloride Stopped (03/20/20 1603)   . amLODipine  10 mg Oral Daily  . famotidine  20 mg Oral Daily  . heparin  5,000 Units Subcutaneous Q8H  . hydrALAZINE  25 mg Oral Q8H  . insulin aspart  0-15 Units Subcutaneous TID WC  . insulin aspart  0-5 Units Subcutaneous QHS  . insulin aspart  2 Units Subcutaneous TID WC  . insulin glargine  5 Units Subcutaneous QHS  . mouth rinse  15 mL Mouth Rinse BID  . nebivolol  10 mg Oral Daily  . senna-docusate  2 tablet Oral BID  . sodium chloride flush  3 mL Intravenous Q12H   sodium chloride, acetaminophen, alum & mag hydroxide-simeth, dextrose, docusate sodium, hydrALAZINE, loperamide, ondansetron (ZOFRAN) IV, polyethylene glycol, sodium chloride flush  Assessment/ Plan:  [name] is a 87 y.o. black Jehovah witness female with diabetes mellitus type II, diabetic retinopathy, hypertension, left eye blindness, congestive heart failure, who is admitted to [facility] on 03/19/2020 for Respiratory failure ([place]) [J96.90] Acute pulmonary edema ([place]) [J81.0] Acute respiratory failure with hypoxia ([place]) [J96.01] Diabetic ketoacidosis without coma associated with type 2 diabetes mellitus ([place]) [E11.10] Anemia, unspecified type [D64.9]  1. Acute kidney injury on chronic kidney disease stage IIIB with nephrotic range proteinuria. (>4.4 grams) Baseline creatinine of 1.8, GFR of 32 on 02/19/2020.  Chronic kidney disease thought to be secondary to diabetic nephropathy although serologic work up not complete.  Current level 3.32 Holding home lisinopril dose.  Will continue to monitor  2. Acute exacerbation of congestive heart failure: echocardiogram on 3/6 with preserved systolic function.  - IV furosemide - low salt diet - fluid restriction  3. Hypertension: home regimen of lisinopril, amlodipine and nebivolol.  - IV furosemide as above - Continue nebivolol and amlodipine.   4. Anemia with kidney failure:   hemoglobin 9.2 microcytic.  With iron deficiency.  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   5. Diabetes mellitus type II with chronic kidney disease: noninsulin dependent at baseline. Currently on insulin glargine and sliding scale. Hemoglobin A1c of 7.5% on 02/19/20.  Not currently on an SGLT-2 inhibitor.   6. Hypokalemia:  Current level 4.9   LOS: 9   3/14/20223:31 PM

## 2020-03-28 NOTE — Care Management Important Message (Signed)
Important Message  Patient Details  Name: Sheri Ryan MRN: [id number] Date of Birth: 03-25-1929   Medicare Important Message Given:  Yes     [name] 03/28/2020, 11:34 AM

## 2020-03-28 NOTE — Progress Notes (Signed)
patient said she is having tightness in her lower chest. she said she doesn't think it is her heart but just gas. she was just in the BR straining to have a BM.  she said she would probably feel better if she had a BM. I will give her the PRN miralax and Dr Manuella Ghazi notified and will order maalox. 125/56, HR71,

## 2020-03-28 NOTE — Progress Notes (Signed)
PT tried to walk the patient and got just outside the room door and pt said she felt lightheaded. we sat her down, BP 129/58 sitting, 104/58 with standing, HR remained in 70's. Dr Manuella Ghazi notified. No changes at this time

## 2020-03-29 DIAGNOSIS — N1832 Chronic kidney disease, stage 3b: Secondary | ICD-10-CM | POA: Diagnosis not present

## 2020-03-29 DIAGNOSIS — J9601 Acute respiratory failure with hypoxia: Secondary | ICD-10-CM | POA: Diagnosis not present

## 2020-03-29 DIAGNOSIS — J810 Acute pulmonary edema: Secondary | ICD-10-CM | POA: Diagnosis not present

## 2020-03-29 LAB — BASIC METABOLIC PANEL
Anion gap: 8 (ref 5–15)
BUN: 82 mg/dL — ABNORMAL HIGH (ref 8–23)
CO2: 25 mmol/L (ref 22–32)
Calcium: 8.8 mg/dL — ABNORMAL LOW (ref 8.9–10.3)
Chloride: 101 mmol/L (ref 98–111)
Creatinine, Ser: 3.26 mg/dL — ABNORMAL HIGH (ref 0.44–1.00)
GFR, Estimated: 13 mL/min — ABNORMAL LOW (ref >60)
Glucose, Bld: 200 mg/dL — ABNORMAL HIGH (ref 70–99)
Potassium: 5.2 mmol/L — ABNORMAL HIGH (ref 3.5–5.1)
Sodium: 134 mmol/L — ABNORMAL LOW (ref 135–145)

## 2020-03-29 LAB — CBC
HCT: 26.7 % — ABNORMAL LOW (ref 36.0–46.0)
Hemoglobin: 8.7 g/dL — ABNORMAL LOW (ref 12.0–15.0)
MCH: 26 pg (ref 26.0–34.0)
MCHC: 32.6 g/dL (ref 30.0–36.0)
MCV: 79.7 fL — ABNORMAL LOW (ref 80.0–100.0)
Platelets: 334 10*3/uL (ref 150–400)
RBC: 3.35 MIL/uL — ABNORMAL LOW (ref 3.87–5.11)
RDW: 16.4 % — ABNORMAL HIGH (ref 11.5–15.5)
WBC: 10 10*3/uL (ref 4.0–10.5)
nRBC: 0 % (ref 0.0–0.2)

## 2020-03-29 LAB — GLUCOSE, CAPILLARY
Glucose-Capillary: 192 mg/dL — ABNORMAL HIGH (ref 70–99)
Glucose-Capillary: 179 mg/dL — ABNORMAL HIGH (ref 70–99)
Glucose-Capillary: 141 mg/dL — ABNORMAL HIGH (ref 70–99)
Glucose-Capillary: 116 mg/dL — ABNORMAL HIGH (ref 70–99)

## 2020-03-29 MED ORDER — BISACODYL 5 MG PO TBEC
10.0000 mg | DELAYED_RELEASE_TABLET | Freq: Every day | ORAL | Status: DC
  Administered 2020-03-29: 10 mg via ORAL
  Filled 2020-03-29 (×2): qty 2

## 2020-03-29 MED ORDER — LACTULOSE 10 GM/15ML PO SOLN: 20.0000 g | Freq: Two times a day (BID) | ORAL | Status: DC | PRN

## 2020-03-29 MED ORDER — LACTULOSE 10 GM/15ML PO SOLN
20.0000 g | Freq: Once | ORAL | Status: AC
  Administered 2020-03-29: 20 g via ORAL
  Filled 2020-03-29: qty 30

## 2020-03-29 MED ORDER — AMLODIPINE BESYLATE 5 MG PO TABS
2.5000 mg | ORAL_TABLET | Freq: Every day | ORAL | Status: DC
  Administered 2020-03-30: 2.5 mg via ORAL
  Filled 2020-03-29: qty 1

## 2020-03-29 NOTE — Progress Notes (Signed)
Patient is requesting to have a BM. I have given her miralax, senna, and colace yesterday and to day without success of having a BM. Order received from Dr Manuella Ghazi for lactulose

## 2020-03-29 NOTE — Progress Notes (Signed)
[facility] [place] NAME: Sheri Ryan    MR#:  [id number]  DATE OF BIRTH:  [birth year]  SUBJECTIVE:  Still continues to feel gassy and not had bowel movement yet.  Tired and sleepy today REVIEW OF SYSTEMS:   Review of Systems  Constitutional: Negative for chills, fever and weight loss.  HENT: Negative for ear discharge, ear pain and nosebleeds.   Eyes: Negative for blurred vision, pain and discharge.  Respiratory: Positive for shortness of breath. Negative for sputum production, wheezing and stridor.   Cardiovascular: Positive for chest pain. Negative for palpitations, orthopnea and PND.  Gastrointestinal: Positive for constipation. Negative for abdominal pain, diarrhea, nausea and vomiting.  Genitourinary: Negative for frequency and urgency.  Musculoskeletal: Negative for back pain and joint pain.  Neurological: Positive for dizziness and weakness. Negative for sensory change, speech change and focal weakness.  Psychiatric/Behavioral: Negative for depression and hallucinations. The patient is not nervous/anxious.    Tolerating Diet:yes Tolerating PT: HHPT  DRUG ALLERGIES:  No Known Allergies  VITALS:  Blood pressure (!) 123/59, pulse 72, temperature 97.9 F (36.6 C), resp. rate 16, height 5\' 5"  (1.651 m), weight 71.7 kg, SpO2 99 %.  PHYSICAL EXAMINATION:   Physical Exam  GENERAL:  87 y.o.-year-old patient lying in the bed with no acute distress.   LUNGS: decreased breath sounds bilaterally, no wheezing, rales, rhonchi. No use of accessory muscles of respiration.  CARDIOVASCULAR: S1, S2 normal. No murmurs, rubs, or gallops.  ABDOMEN: Soft, nontender, nondistended. Bowel sounds present. No organomegaly or mass.  EXTREMITIES: No cyanosis, clubbing or edema b/l.    NEUROLOGIC: Cranial nerves II through XII are intact. No focal Motor or sensory deficits b/l.   PSYCHIATRIC:  patient is alert and oriented x 3.  SKIN: No obvious  rash, lesion, or ulcer.   LABORATORY PANEL:  CBC Recent Labs  Lab 03/29/20 0550  WBC 10.0  HGB 8.7*  HCT 26.7*  PLT 334    Chemistries  Recent Labs  Lab 03/26/20 0452 03/27/20 0831 03/28/20 1130 03/29/20 0550  NA 135   < > 135 134*  K 3.8   < > 4.9 5.2*  CL 101   < > 100 101  CO2 24   < > 25 25  GLUCOSE 107*   < > 198* 200*  BUN 56*   < > 77* 82*  CREATININE 2.39*   < > 3.32* 3.26*  CALCIUM 8.5*   < > 9.1 8.8*  MG 2.4  --   --   --   AST  --   --  31  --   ALT  --   --  30  --   ALKPHOS  --   --  63  --   BILITOT  --   --  0.6  --    < > = values in this interval not displayed.   Cardiac Enzymes No results for input(s): TROPONINI in the last 168 hours. RADIOLOGY:  No results found. ASSESSMENT AND PLAN:  Sheri Ryan is a 87 y.o. female brought in by EMS on CPAP for shortness of breath/respiratory failure.  They report room air saturation was 80% upon arrival, no reports of fevers.  Acute hypoxic respiratory failure secondary to pulmonary edema/pulmonary hypertension with underlying chronic anemia and mitral regurgitation -respiratory failure is now resolved Acute on chronic diastolic dysfunction Sepsis ruled out -- Holding Lasix due to rising creat. -- appreciate [facility] cardiology input.per Dr  gollan no Plans for right heart catheterization cont current mnx and f/u cardio as out pt -- Patient is improving slowly. Good urine output.  Elevated troponin in the setting of congestive heart failure/pulmonary edema -- initial troponin 34--- 491 -- no heparin given underlying anemia -- echo shows preserved LV systolic function.  Acute on CKD stage IIIb -- baseline creatinine 1.6 -- 1.8 --creat 2.26--2.39-> 3.3 -Holding diuretics or any other nephrotoxic medications -Nephrology following  Anemia of chronic disease -- hemoglobin stable at 9.2 > 8.7 -- she is Jehovah's Witness  Hypertension-- Continue hydralazine and Bystolic.  Cut back on amlodipine  dose at 2.5 mg once daily today  Diabetes type II with chronic kidney disease non-insulin-dependent -- continue Lantus and sliding scale insulin - NovoLog 2 units subcu with each meal  Constipation/gas pain -On Colace, Dulcolax, MiraLAX.  Adding lactulose today is still no bowel movement Maalox as needed  Procedures: None Family communication : son Sheri Ryan on the phone on 3/14 Consults : cardiology, nephrology CODE STATUS: partial, DNI DVT Prophylaxis : heparin Level of care: Med-Surg Status is: Inpatient  Remains inpatient appropriate because:Inpatient level of care appropriate due to severity of illness   Dispo: The patient is from: Home              Anticipated d/c is to: Home with home health              Patient currently is medically improving   Difficult to place patient No   Kidney function still remains high.  Will need more monitoring.  No bowel movement yet.    TOTAL TIME TAKING CARE OF THIS PATIENT: 25 minutes.  >50% time spent on counselling and coordination of care  Note: This dictation was prepared with Dragon dictation along with smaller phrase technology. Any transcriptional errors that result from this process are unintentional.  Max Sane M.D    [facility]   CC: Primary care physician; Glean Hess, MDPatient ID: Sheri Ryan, female   DOB: April 08, 1929, 87 y.o.   MRN: [id number]

## 2020-03-29 NOTE — Progress Notes (Signed)
[facility]  ROUNDING NOTE   Subjective:   [name] was admitted to [facility] on 03/19/2020 for Respiratory failure [facility]) [J96.90] Acute pulmonary edema ([place]) [J81.0] Acute respiratory failure with hypoxia ([place]) [J96.01] Diabetic ketoacidosis without coma associated with type 2 diabetes mellitus ([place]) [E11.10] Anemia, unspecified type [D64.9]  UOP 2659mL - furosemide 80mg  IV bid  Patient sitting in chair at bedside Alert and oriented Tolerating meals without nausea Denies shortness of breath Admits to being dizzy while standing   Objective:  Vital signs in last 24 hours:  Temp:  [97.6 F (36.4 C)-98.4 F (36.9 C)] 97.9 F (36.6 C) (03/15 1140) Pulse Rate:  [66-77] 72 (03/15 1140) Resp:  [16-22] 16 (03/15 1140) BP: (104-136)/(54-78) 123/59 (03/15 1140) SpO2:  [92 %-100 %] 99 % (03/15 1140) Weight:  [71.7 kg] 71.7 kg (03/15 0500)  Weight change:  Filed Weights   03/26/20 0500 03/27/20 0129 03/29/20 0500  Weight: 68.6 kg 70.3 kg 71.7 kg    Intake/Output: I/O last 3 completed shifts: In: 600 [P.O.:600] Out: -    Intake/Output this shift:  Total I/O In: 240 [P.O.:240] Out: -   Physical Exam: General: No acute distress, sitting in chair  Head: Normocephalic, moist oral mus[place]a  Eyes: Anicteric, PERRL  Neck: Supple, trachea midline  Lungs:  Clear, normal breathing  Heart: Regular rate and rhythm  Abdomen:  Soft, nontender  Extremities: no peripheral edema.  Neurologic: Nonfocal, moving all four extremities  Skin: No lesions       Basic Metabolic Panel: Recent Labs  Lab 03/23/20 0705 03/24/20 0420 03/25/20 0510 03/26/20 0452 03/27/20 0831 03/27/20 1156 03/28/20 1130 03/29/20 0550  NA 143 143 138 135  --  134* 135 134*  K 4.9 4.6 3.1* 3.8  --  4.9 4.9 5.2*  CL 113* 106 101 101  --  100 100 101  CO2 16* 27 27 24   --  21* 25 25  GLUCOSE 128* 87 124* 107*  --  213* 198* 200*  BUN 42* 41* 46* 56*  --  63* 77* 82*   CREATININE 2.40* 2.21* 2.26* 2.39* 2.18* 2.35* 3.32* 3.26*  CALCIUM 8.7* 8.8* 8.5* 8.5*  --  8.7* 9.1 8.8*  MG 2.1 2.1 1.8 2.4  --   --   --   --   PHOS  --  3.9  --   --   --   --   --   --     Liver Function Tests: Recent Labs  Lab 03/23/20 0705 03/28/20 1130  AST  --  31  ALT  --  30  ALKPHOS  --  63  BILITOT  --  0.6  PROT  --  8.0  ALBUMIN 2.5* 3.0*   No results for input(s): LIPASE, AMYLASE in the last 168 hours. No results for input(s): AMMONIA in the last 168 hours.  CBC: Recent Labs  Lab 03/23/20 0705 03/25/20 0510 03/28/20 1130 03/29/20 0550  WBC 13.5* 10.4 11.0* 10.0  NEUTROABS  --   --  6.1  --   HGB 7.6* 8.5* 9.2* 8.7*  HCT 22.6* 27.0* 29.2* 26.7*  MCV 77.4* 80.1 80.4 79.7*  PLT 324 400 371 334    Cardiac Enzymes: No results for input(s): CKTOTAL, CKMB, CKMBINDEX, TROPONINI in the last 168 hours.  BNP: Invalid input(s): POCBNP  CBG: Recent Labs  Lab 03/28/20 1131 03/28/20 1729 03/28/20 2104 03/29/20 0749 03/29/20 1140  GLUCAP 183* 238* 128* 192* 179*    Microbiology: Results  for orders placed or performed during the hospital encounter of 03/19/20  Blood culture (routine x 2)     Status: None   Collection Time: 03/19/20  8:14 AM   Specimen: BLOOD  Result Value Ref Range Status   Specimen Description BLOOD RIGHT FOREARM  Final   Special Requests   Final    BOTTLES DRAWN AEROBIC AND ANAEROBIC Blood Culture results may not be optimal due to an excessive volume of blood received in culture bottles   Culture   Final    NO GROWTH 5 DAYS Performed at [facility], [address] [place], [place] [address]    Report Status 03/24/2020 FINAL  Final  Blood culture (routine x 2)     Status: None   Collection Time: 03/19/20  8:14 AM   Specimen: BLOOD  Result Value Ref Range Status   Specimen Description BLOOD RIGHT HAND  Final   Special Requests   Final    BOTTLES DRAWN AEROBIC AND ANAEROBIC Blood Culture adequate volume   Culture    Final    NO GROWTH 5 DAYS Performed at [facility], [address] [place], [place] [address]    Report Status 03/24/2020 FINAL  Final  Resp Panel by RT-PCR (Flu A&B, Covid) Nasopharyngeal Swab     Status: None   Collection Time: 03/19/20  8:14 AM   Specimen: Nasopharyngeal Swab; Nasopharyngeal(NP) swabs in vial transport medium  Result Value Ref Range Status   SARS Coronavirus 2 by RT PCR NEGATIVE NEGATIVE Final    Comment: (NOTE) SARS-CoV-2 target nucleic acids are NOT DETECTED.  The SARS-CoV-2 RNA is generally detectable in upper respiratory specimens during the acute phase of infection. The lowest concentration of SARS-CoV-2 viral copies this assay can detect is 138 copies/mL. A negative result does not preclude SARS-Cov-2 infection and should not be used as the sole basis for treatment or other patient management decisions. A negative result may occur with  improper specimen collection/handling, submission of specimen other than nasopharyngeal swab, presence of viral mutation(s) within the areas targeted by this assay, and inadequate number of viral copies(<138 copies/mL). A negative result must be combined with clinical observations, patient history, and epidemiological information. The expected result is Negative.  Fact Sheet for Patients:  EntrepreneurPulse.com.au  Fact Sheet for Healthcare Providers:  IncredibleEmployment.be  This test is no t yet approved or cleared by the [place] FDA and  has been authorized for detection and/or diagnosis of SARS-CoV-2 by FDA under an Emergency Use Authorization (EUA). This EUA will remain  in effect (meaning this test can be used) for the duration of the COVID-19 declaration under Section 564(b)(1) of the Act, 21 U.S.C.section 360bbb-3(b)(1), unless the authorization is terminated  or revoked sooner.       Influenza A by PCR NEGATIVE NEGATIVE Final   Influenza B by PCR  NEGATIVE NEGATIVE Final    Comment: (NOTE) The Xpert Xpress SARS-CoV-2/FLU/RSV plus assay is intended as an aid in the diagnosis of influenza from Nasopharyngeal swab specimens and should not be used as a sole basis for treatment. Nasal washings and aspirates are unacceptable for Xpert Xpress SARS-CoV-2/FLU/RSV testing.  Fact Sheet for Patients: EntrepreneurPulse.com.au  Fact Sheet for Healthcare Providers: IncredibleEmployment.be  This test is not yet approved or cleared by the [place] FDA and has been authorized for detection and/or diagnosis of SARS-CoV-2 by FDA under an Emergency Use Authorization (EUA). This EUA will remain in effect (meaning this test can be used) for the duration  of the COVID-19 declaration under Section 564(b)(1) of the Act, 21 U.S.C. section 360bbb-3(b)(1), unless the authorization is terminated or revoked.  Performed at [facility], [address] [place], [place] [address]   MRSA PCR Screening     Status: None   Collection Time: 03/19/20 11:17 AM   Specimen: Nasopharyngeal  Result Value Ref Range Status   MRSA by PCR NEGATIVE NEGATIVE Final    Comment:        The GeneXpert MRSA Assay (FDA approved for NASAL specimens only), is one component of a comprehensive MRSA colonization surveillance program. It is not intended to diagnose MRSA infection nor to guide or monitor treatment for MRSA infections. Performed at [facility], [address] [place], [place] [address]     Coagulation Studies: No results for input(s): LABPROT, INR in the last 72 hours.  Urinalysis: No results for input(s): COLORURINE, LABSPEC, PHURINE, GLUCOSEU, HGBUR, BILIRUBINUR, KETONESUR, PROTEINUR, UROBILINOGEN, NITRITE, LEUKOCYTESUR in the last 72 hours.  Invalid input(s): APPERANCEUR    Imaging: No results found.   Medications:   . sodium chloride Stopped (03/20/20 1603)   . [START ON 03/30/2020]  amLODipine  2.5 mg Oral Daily  . bisacodyl  10 mg Oral Daily  . famotidine  20 mg Oral Daily  . heparin  5,000 Units Subcutaneous Q8H  . hydrALAZINE  25 mg Oral Q8H  . insulin aspart  0-15 Units Subcutaneous TID WC  . insulin aspart  0-5 Units Subcutaneous QHS  . insulin aspart  2 Units Subcutaneous TID WC  . insulin glargine  5 Units Subcutaneous QHS  . lactulose  20 g Oral Once  . mouth rinse  15 mL Mouth Rinse BID  . nebivolol  10 mg Oral Daily  . senna-docusate  2 tablet Oral BID  . sodium chloride flush  3 mL Intravenous Q12H   sodium chloride, acetaminophen, alum & mag hydroxide-simeth, dextrose, docusate sodium, hydrALAZINE, lactulose, loperamide, ondansetron (ZOFRAN) IV, polyethylene glycol, sodium chloride flush  Assessment/ Plan:  [name] is a 87 y.o. black Jehovah witness female with diabetes mellitus type II, diabetic retinopathy, hypertension, left eye blindness, congestive heart failure, who is admitted to [facility] on 03/19/2020 for Respiratory failure ([place]) [J96.90] Acute pulmonary edema ([place]) [J81.0] Acute respiratory failure with hypoxia ([place]) [J96.01] Diabetic ketoacidosis without coma associated with type 2 diabetes mellitus ([place]) [E11.10] Anemia, unspecified type [D64.9]  1. Acute kidney injury on chronic kidney disease stage IIIB with nephrotic range proteinuria. (>4.4 grams) Baseline creatinine of 1.8, GFR of 32 on 02/19/2020.  Chronic kidney disease thought to be secondary to diabetic nephropathy although serologic work up not complete.  Current level 3.26 Holding home lisinopril dose.  Reduced to Amlodipine 2.5mg  Will continue to monitor  2. Acute exacerbation of congestive heart failure: echocardiogram on 3/6 with preserved systolic function.  - IV furosemide - low salt diet - fluid restriction  3. Hypertension: home regimen of lisinopril, amlodipine and nebivolol.  - IV furosemide as above - BP controlled but may be too low for patient  tolerance and proper kidney perfusion. - Continue nebivolol and amlodipine 2.5mg .   4. Anemia with kidney failure:  hemoglobin 8.7 microcytic.  With iron deficiency.  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   5. Diabetes mellitus type II with chronic kidney disease: noninsulin dependent at baseline. Currently on insulin glargine and sliding scale. Hemoglobin A1c of 7.5% on 02/19/20.  Not currently on an SGLT-2 inhibitor.   6.  Hypokalemia:  resolved   LOS: [place] 3/15/20222:13 PM

## 2020-03-29 NOTE — Progress Notes (Signed)
Physical Therapy Treatment Patient Details Name: Sheri Ryan MRN: [id number] DOB: Jun 30, 1929 Today's Date: 03/29/2020    History of Present Illness 87 yo AAF with Severe ACUTE Hypoxic and Hypercapnic Respiratory Failure due to bilateral infiltrates with acute bacterial/viral pneumonia with findings of acute S CHF exacerbation with acute on chronic renal failure with acidosis and DKA. PMH of Blind left eye, Cataract, Chronic kidney disease, Diabetes mellitus without complication ([place]), Hypertension, and Retinopathy.    PT Comments    Pt was seated in recliner upon arriving. She agrees to PT session and is pleasant and cooperative throughout. Easily able to stand and ambulate with use of RW. No LOB or unsteadiness throughout session. Did endorse stomach pain with minimal gait distance and was unwilling to trial stair training. Pt will benefit from continued skilled PT to address deficits while improving activity tolerance.   Follow Up Recommendations  Home health PT;Supervision - Intermittent     Equipment Recommendations  Rolling walker with 5" wheels    Recommendations for Other Services       Precautions / Restrictions Precautions Precautions: Fall Restrictions Weight Bearing Restrictions: No    Mobility  Bed Mobility      General bed mobility comments: pt was in recliner pre/post session    Transfers Overall transfer level: Needs assistance Equipment used: Rolling walker (2 wheeled) Transfers: Sit to/from Stand Sit to Stand: Supervision     Ambulation/Gait Ambulation/Gait assistance: Supervision;Min guard Gait Distance (Feet): 75 Feet Assistive device: Rolling walker (2 wheeled) Gait Pattern/deviations: Decreased step length - left;Decreased step length - right;Decreased stride length Gait velocity: decreased   General Gait Details: gait limited by stomach pain during ambulation       Balance Overall balance assessment: Needs  assistance Sitting-balance support: No upper extremity supported;Feet supported Sitting balance-Leahy Scale: Good     Standing balance support: Bilateral upper extremity supported Standing balance-Leahy Scale: Good           Cognition Arousal/Alertness: Awake/alert Behavior During Therapy: WFL for tasks assessed/performed Overall Cognitive Status: Within Functional Limits for tasks assessed            General Comments: Pt is A and O x 4             Pertinent Vitals/Pain Pain Assessment: 0-10           PT Goals (current goals can now be found in the care plan section) Acute Rehab PT Goals Patient Stated Goal: to go home Progress towards PT goals: Progressing toward goals    Frequency    Min 2X/week      PT Plan Current plan remains appropriate    Co-evaluation     PT goals addressed during session: Mobility/safety with mobility;Balance;Proper use of DME;Strengthening/ROM        AM-PAC PT "6 Clicks" Mobility   Outcome Measure  Help needed turning from your back to your side while in a flat bed without using bedrails?: None Help needed moving from lying on your back to sitting on the side of a flat bed without using bedrails?: A Little Help needed moving to and from a bed to a chair (including a wheelchair)?: A Little Help needed standing up from a chair using your arms (e.g., wheelchair or bedside chair)?: A Little Help needed to walk in hospital room?: A Little Help needed climbing 3-5 steps with a railing? : A Little 6 Click Score: 19    End of Session Equipment Utilized During Treatment: Gait belt Activity Tolerance: Patient  tolerated treatment well Patient left: in chair;with call bell/phone within reach;with nursing/sitter in room;with chair alarm set Nurse Communication: Mobility status PT Visit Diagnosis: Muscle weakness (generalized) (M62.81);Unsteadiness on feet (R26.81)     Time: 2446-9507 PT Time Calculation (min) (ACUTE ONLY): 24  min  Charges:  $Gait Training: 8-22 mins $Therapeutic Activity: 8-22 mins                     Julaine Fusi PTA 03/29/20, 12:52 PM

## 2020-03-30 DIAGNOSIS — Z7189 Other specified counseling: Secondary | ICD-10-CM

## 2020-03-30 DIAGNOSIS — J810 Acute pulmonary edema: Secondary | ICD-10-CM | POA: Diagnosis not present

## 2020-03-30 DIAGNOSIS — Z515 Encounter for palliative care: Secondary | ICD-10-CM | POA: Diagnosis not present

## 2020-03-30 DIAGNOSIS — D649 Anemia, unspecified: Secondary | ICD-10-CM | POA: Diagnosis not present

## 2020-03-30 DIAGNOSIS — E1110 Type 2 diabetes mellitus with ketoacidosis without coma: Secondary | ICD-10-CM

## 2020-03-30 DIAGNOSIS — N179 Acute kidney failure, unspecified: Secondary | ICD-10-CM | POA: Diagnosis not present

## 2020-03-30 DIAGNOSIS — J9601 Acute respiratory failure with hypoxia: Secondary | ICD-10-CM | POA: Diagnosis not present

## 2020-03-30 LAB — CBC
HCT: 25.1 % — ABNORMAL LOW (ref 36.0–46.0)
Hemoglobin: 8.1 g/dL — ABNORMAL LOW (ref 12.0–15.0)
MCH: 25.5 pg — ABNORMAL LOW (ref 26.0–34.0)
MCHC: 32.3 g/dL (ref 30.0–36.0)
MCV: 78.9 fL — ABNORMAL LOW (ref 80.0–100.0)
Platelets: 280 10*3/uL (ref 150–400)
RBC: 3.18 MIL/uL — ABNORMAL LOW (ref 3.87–5.11)
RDW: 16.3 % — ABNORMAL HIGH (ref 11.5–15.5)
WBC: 9 10*3/uL (ref 4.0–10.5)
nRBC: 0 % (ref 0.0–0.2)

## 2020-03-30 LAB — BASIC METABOLIC PANEL
Anion gap: 9 (ref 5–15)
BUN: 85 mg/dL — ABNORMAL HIGH (ref 8–23)
CO2: 24 mmol/L (ref 22–32)
Calcium: 8.6 mg/dL — ABNORMAL LOW (ref 8.9–10.3)
Chloride: 103 mmol/L (ref 98–111)
Creatinine, Ser: 2.83 mg/dL — ABNORMAL HIGH (ref 0.44–1.00)
GFR, Estimated: 16 mL/min — ABNORMAL LOW (ref >60)
Glucose, Bld: 183 mg/dL — ABNORMAL HIGH (ref 70–99)
Potassium: 4.2 mmol/L (ref 3.5–5.1)
Sodium: 136 mmol/L (ref 135–145)

## 2020-03-30 LAB — GLUCOSE, CAPILLARY
Glucose-Capillary: 201 mg/dL — ABNORMAL HIGH (ref 70–99)
Glucose-Capillary: 221 mg/dL — ABNORMAL HIGH (ref 70–99)

## 2020-03-30 MED ORDER — AMLODIPINE BESYLATE 2.5 MG PO TABS
2.5000 mg | ORAL_TABLET | Freq: Every day | ORAL | 0 refills | Status: DC
Start: 2020-03-31 — End: 2020-04-20

## 2020-03-30 MED ORDER — BYSTOLIC 10 MG PO TABS: 10.0000 mg | ORAL_TABLET | Freq: Every day | ORAL | 5 refills | Status: DC

## 2020-03-30 NOTE — Consult Note (Signed)
Consultation Note Date: 03/30/2020   Patient Name: Sheri Ryan  DOB: 12-26-1929  MRN: [id number]  Age / Sex: 87 y.o., female  PCP: Glean Hess, MD Referring Physician: No att. providers found  Reason for Consultation: Establishing goals of care  HPI/Patient Profile: 87 y.o. female  with past medical history of HTN, T2DM, and CKD admitted on 03/19/2020 with shortness of breath. Placed on bipap at admission. Required insulin infusion d/t hyperglycemia. Found to have elevated troponin. Treated for acute hypoxic respiratory failure secondary to pulmonary edema/pulmonary hypertension with underlying chronic anemia and mitral regurgitation. Also with AKI on CKD. PMT consulted to discuss [place].   Clinical Assessment and Goals of Care: I have reviewed medical records including EPIC notes, labs and imaging, assessed the patient and then met with patient  to discuss diagnosis prognosis, GOC, EOL wishes, disposition and options.  I introduced Palliative Medicine as specialized medical care for people living with serious illness. It focuses on providing relief from the symptoms and stress of a serious illness. The goal is to improve quality of life for both the patient and the family.  Patient tells me she lives at home with her 29 yr old spouse and 7 yr old grandson. She tells me they both provide support to her however her spouse is limited d/t physical decline. She tells me of recently feeling weaker at home and short of breath. Tells me she spent most of her time in a chair - was able to transfer with walker. Tells me of very poor appetite for a while.   She tells me of her faith and how this affects her medical decisions. She tells me of not wanting blood transfusions.    We discussed patient's current illness and what it means in the larger context of patient's on-going co-morbidities. Patient had poor understanding of illness - we discuss  her CHF that led to pulmonary edema and caused shortness of breath. Discussed AKI and relationship between heart and kidneys. She does express understanding of this. She tells me of her motivation to improve lifestyle habits. She tells of intended compliance with prescribed medications and following up with cardiology soon.   I attempted to elicit values and goals of care important to the patient.  The difference between aggressive medical intervention and comfort care was considered in light of the patient's goals of care. Advance directives, concepts specific to code status, artificial feeding and hydration, and rehospitalization were considered and discussed. I introduced a MOST form to patient. She is open to completing it and we discuss each option. She does tell me she would like to include her family in this conversation. However she is discharging the hospital very soon and family meeting cannot be arranged today. She is agreeable to following up with palliative outpatient. She briefly shares that she does not think she would be interested in interventions like CPR or mechanical ventilation however she would like to discuss further with family before deciding.   Discussed with patient the importance of continued conversation with family and the medical providers regarding overall plan of care and treatment options, ensuring decisions are within the context of the patient's values and GOCs.    We discuss decision maker if patient were unable - her spouse is next of kin and she does not have HCPOA documentation. She tells me she feels spouse would be able to make decisions for her that supported her wishes.   Questions and concerns were addressed. The family was encouraged to  call with questions or concerns.   Primary Decision Maker PATIENT  spouse if patient unable  SUMMARY OF RECOMMENDATIONS   - MOST introduced - to complete with OP palliative - discussion of diagnoses - palliative to follow  outpatient - referral given to liaison - dc today  Code Status/Advance Care Planning:  Full code - needs further discussion  Discharge Planning: Home with Palliative Services      Primary Diagnoses: Present on Admission: . Respiratory failure ([place])   I have reviewed the medical record, interviewed the patient and family, and examined the patient. The following aspects are pertinent.  Past Medical History:  Diagnosis Date  . Blind left eye   . Cataract   . Chronic kidney disease   . Diabetes mellitus without complication ([place])   . Hypertension   . Retinopathy    Social History   Socioeconomic History  . Marital status: Married    Spouse name: Not on file  . Number of children: 3  . Years of education: Not on file  . Highest education level: 12th grade  Occupational History  . Occupation: Retired  Tobacco Use  . Smoking status: Former Smoker    Packs/day: 1.00    Years: 9.00    Pack years: 9.00    Types: Cigarettes    Quit date: 1975    Years since quitting: 47.2  . Smokeless tobacco: Never Used  . Tobacco comment: smoking cessation materials not required  Vaping Use  . Vaping Use: Never used  Substance and Sexual Activity  . Alcohol use: Not Currently  . Drug use: No  . Sexual activity: Not Currently  Other Topics Concern  . Not on file  Social History Narrative   Patient is Jehovah's Witness - no blood transfusions.   Social Determinants of Health   Financial Resource Strain: Not on file  Food Insecurity: Not on file  Transportation Needs: Not on file  Physical Activity: Not on file  Stress: Not on file  Social Connections: Not on file   Family History  Problem Relation Age of Onset  . Hypertension Mother   . Diabetes Brother    Scheduled Meds: . amLODipine  2.5 mg Oral Daily  . bisacodyl  10 mg Oral Daily  . famotidine  20 mg Oral Daily  . heparin  5,000 Units Subcutaneous Q8H  . hydrALAZINE  25 mg Oral Q8H  . insulin aspart  0-15 Units  Subcutaneous TID WC  . insulin aspart  0-5 Units Subcutaneous QHS  . insulin aspart  2 Units Subcutaneous TID WC  . insulin glargine  5 Units Subcutaneous QHS  . mouth rinse  15 mL Mouth Rinse BID  . nebivolol  10 mg Oral Daily  . senna-docusate  2 tablet Oral BID  . sodium chloride flush  3 mL Intravenous Q12H   Continuous Infusions: . sodium chloride Stopped (03/20/20 1603)   PRN Meds:.sodium chloride, acetaminophen, alum & mag hydroxide-simeth, dextrose, docusate sodium, lactulose, loperamide, ondansetron (ZOFRAN) IV, polyethylene glycol, sodium chloride flush No Known Allergies Review of Systems  Constitutional: Positive for activity change, appetite change and fatigue.  Neurological: Positive for weakness.    Physical Exam Constitutional:      General: She is not in acute distress. Pulmonary:     Effort: Pulmonary effort is normal.  Skin:    General: Skin is warm and dry.  Neurological:     Mental Status: She is alert and oriented to person, place, and time.  Psychiatric:  Mood and Affect: Mood normal.        Behavior: Behavior normal.     Vital Signs: BP (!) 139/56 (BP Location: Right Arm)   Pulse 73   Temp 97.7 F (36.5 C) (Oral)   Resp 18   Ht _0  (1.651 m)   Wt 71.7 kg   SpO2 99%   BMI 26.30 kg/m  Pain Scale: 0-10 POSS *See Group Information*: 1-Acceptable,Awake and alert Pain Score: 0-No pain   SpO2: SpO2: 99 % O2 Device:SpO2: 99 % O2 Flow Rate: .O2 Flow Rate (L/min): 1 L/min  IO: Intake/output summary:   Intake/Output Summary (Last 24 hours) at 03/30/2020 1615 Last data filed at 03/30/2020 1420 Gross per 24 hour  Intake 360 ml  Output 2 ml  Net 358 ml    LBM: Last BM Date: 03/29/20 Baseline Weight: Weight: 74 kg Most recent weight: Weight: 71.7 kg     Palliative Assessment/Data: PPS 50%    Time Total: 55 minutes Greater than 50%  of this time was spent counseling and coordinating care related to the above assessment and  plan.  Juel Burrow, DNP, AGNP-C Palliative Medicine Team [phone] Pager: [phone]

## 2020-03-30 NOTE — TOC Transition Note (Signed)
Transition of Care [facility]) - CM/SW Discharge Note   Patient Details  Name: Sheri Ryan MRN: [id number] Date of Birth: 1929-03-23  [facility]) CM/SW Contact:  Eileen Stanford, LCSW Phone Number: 03/30/2020, 12:36 PM   Clinical Narrative: CSW met with pt at dc. MD also spoke to pt. At this time pt, whom is alert and oritned is agreeable to d/c. HH is arranged through [facility]. Corene Cornea is aware pt is d/c and states therapy will see pt tomorrow. Pt's son is updated--and provided with a number to reach out to [facility]. Pt called pt's spouse while CSW was present in the room and he will come pick the pt up. Pt has a walker at bedside to take home and was provided with a blood pressure cuff. NO additional needs at this time.      Final next level of care: [place] Barriers to Discharge: No Barriers Identified   Patient Goals and CMS Choice Patient states their goals for this hospitalization and ongoing recovery are:: To return home and to get her strength back. CMS Medicare.gov Compare Post Acute Care list provided to:: Other (Comment Required) Choice offered to / list presented to : Spouse  Discharge Placement                Patient to be transferred to facility by: Spouse Name of family member notified: spouse and son Patient and family notified of of transfer: 03/30/20  Discharge Plan and Services In-house Referral: Clinical Social Work   Post Acute Care Choice: Home Health                               Social Determinants of Health (SDOH) Interventions     Readmission Risk Interventions No flowsheet data found.

## 2020-03-30 NOTE — Progress Notes (Signed)
[place] [facility]) Hospital Liaison RN note:  Received new referral from Dr. Manuella Ghazi and  Kathie Rhodes, NP for [facility]) out patient palliative program to follow post discharge. Referral has received patient information. Bridget Cobb, TOC is aware. Plan is to discharge home today with [place]. [place] Liaison will continue to follow until discharged.  Thank you for this referral.  Zandra Abts, RN [facility] Liaison [phone]

## 2020-03-30 NOTE — Discharge Instructions (Signed)
Chronic Kidney Disease, Adult Chronic kidney disease is when lasting damage happens to the kidneys slowly over a long time. The kidneys help to:  Make pee (urine).  Make hormones.  Keep the right amount of fluids and chemicals in the body. Most often, this disease does not go away. You must take steps to help keep the kidney damage from getting worse. If steps are not taken, the kidneys might stop working forever. What are the causes?  Diabetes.  High blood pressure.  Diseases that affect the heart and blood vessels.  Other kidney diseases.  Diseases of the body's disease-fighting system.  A problem with the flow of pee.  Infections of the organs that make pee, store it, and take it out of the body.  Swelling or irritation of your blood vessels. What increases the risk?  Getting older.  Having someone in your family who has kidney disease or kidney failure.  Having a disease caused by genes.  Taking medicines often that harm the kidneys.  Being near or having contact with harmful substances.  Being very overweight.  Using tobacco now or in the past. What are the signs or symptoms?  Feeling very tired.  Having a swollen face, legs, ankles, or feet.  Feeling like you may vomit or vomiting.  Not feeling hungry.  Being confused or not able to focus.  Twitches and cramps in the leg muscles or other muscles.  Dry, itchy skin.  A taste of metal in your mouth.  Making less pee, or making more pee.  Shortness of breath.  Trouble sleeping. You may also become anemic or get weak bones. Anemic means there is not enough red blood cells or hemoglobin in your blood. You may get symptoms slowly. You may not notice them until the kidney damage gets very bad. How is this treated? Often, there is no cure for this disease. Treatment can help with symptoms and help keep the disease from getting worse. You may need to:  Avoid alcohol.  Avoid foods that are high in  salt, potassium, phosphorous, and protein.  Take medicines for symptoms and to help control other conditions.  Have dialysis. This treatment gets harmful waste out of your body.  Treat other problems that cause your kidney disease or make it worse. Follow these instructions at home: Medicines  Take over-the-counter and prescription medicines only as told by your doctor.  Do not take any new medicines, vitamins, or supplements unless your doctor says it is okay. Lifestyle  Do not smoke or use any products that contain nicotine or tobacco. If you need help quitting, ask your doctor.  If you drink alcohol: ? Limit how much you use to:  0-1 drink a day for women who are not pregnant.  0-2 drinks a day for men. ? Know how much alcohol is in your drink. In the [place] one drink equals one 12 oz bottle of beer (355 mL), one 5 oz glass of wine (148 mL), or one 1 oz glass of hard liquor (44 mL).  Stay at a healthy weight. If you need help losing weight, ask your doctor.   General instructions  Follow instructions from your doctor about what you cannot eat or drink.  Track your blood pressure at home. Tell your doctor about any changes.  If you have diabetes, track your blood sugar.  Exercise at least 30 minutes a day, 5 days a week.  Keep your shots (vaccinations) up to date.  Keep all follow-up visits.     Where to find more information  American Association of Kidney Patients: www.aakp.org  National Kidney Foundation: www.kidney.org  American Kidney Fund: www.akfinc.org  Life Options: www.lifeoptions.org  Kidney School: www.kidneyschool.org Contact a doctor if:  Your symptoms get worse.  You get new symptoms. Get help right away if:  You get symptoms of end-stage kidney disease. These include: ? Headaches. ? Losing feeling in your hands or feet. ? Easy bruising. ? Having hiccups often. ? Chest pain. ? Shortness of breath. ? Lack of menstrual periods, in  women.  You have a fever.  You make less pee than normal.  You have pain or you bleed when you pee or poop. These symptoms may be an emergency. Get help right away. Call your local emergency services (911 in the [place]  Do not wait to see if the symptoms will go away.  Do not drive yourself to the hospital. Summary  Chronic kidney disease is when lasting damage happens to the kidneys slowly over a long time.  Causes of this disease include diabetes and high blood pressure.  Often, there is no cure for this disease. Treatment can help symptoms and help keep the disease from getting worse.  Treatment may involve lifestyle changes, medicines, and dialysis. This information is not intended to replace advice given to you by your health care provider. Make sure you discuss any questions you have with your health care provider. Document Revised: 04/08/2019 Document Reviewed: 04/08/2019 Elsevier Patient Education  2021 Elsevier Inc.  

## 2020-03-31 DIAGNOSIS — N179 Acute kidney failure, unspecified: Secondary | ICD-10-CM | POA: Diagnosis not present

## 2020-03-31 DIAGNOSIS — I5033 Acute on chronic diastolic (congestive) heart failure: Secondary | ICD-10-CM | POA: Diagnosis not present

## 2020-03-31 DIAGNOSIS — Z87891 Personal history of nicotine dependence: Secondary | ICD-10-CM | POA: Diagnosis not present

## 2020-03-31 DIAGNOSIS — E11319 Type 2 diabetes mellitus with unspecified diabetic retinopathy without macular edema: Secondary | ICD-10-CM | POA: Diagnosis not present

## 2020-03-31 DIAGNOSIS — I110 Hypertensive heart disease with heart failure: Secondary | ICD-10-CM | POA: Diagnosis not present

## 2020-03-31 DIAGNOSIS — I051 Rheumatic mitral insufficiency: Secondary | ICD-10-CM | POA: Diagnosis not present

## 2020-03-31 DIAGNOSIS — N1832 Chronic kidney disease, stage 3b: Secondary | ICD-10-CM | POA: Diagnosis not present

## 2020-03-31 DIAGNOSIS — H5462 Unqualified visual loss, left eye, normal vision right eye: Secondary | ICD-10-CM | POA: Diagnosis not present

## 2020-03-31 DIAGNOSIS — Z9181 History of falling: Secondary | ICD-10-CM | POA: Diagnosis not present

## 2020-03-31 DIAGNOSIS — I272 Pulmonary hypertension, unspecified: Secondary | ICD-10-CM | POA: Diagnosis not present

## 2020-03-31 DIAGNOSIS — E1136 Type 2 diabetes mellitus with diabetic cataract: Secondary | ICD-10-CM | POA: Diagnosis not present

## 2020-03-31 DIAGNOSIS — D631 Anemia in chronic kidney disease: Secondary | ICD-10-CM | POA: Diagnosis not present

## 2020-03-31 DIAGNOSIS — Z7984 Long term (current) use of oral hypoglycemic drugs: Secondary | ICD-10-CM | POA: Diagnosis not present

## 2020-03-31 DIAGNOSIS — K5900 Constipation, unspecified: Secondary | ICD-10-CM | POA: Diagnosis not present

## 2020-03-31 DIAGNOSIS — E1122 Type 2 diabetes mellitus with diabetic chronic kidney disease: Secondary | ICD-10-CM | POA: Diagnosis not present

## 2020-04-01 ENCOUNTER — Telehealth: Payer: Self-pay | Admitting: Primary Care

## 2020-04-01 NOTE — Discharge Summary (Signed)
[facility] NAME: Sheri Ryan    MR#:  [id number]  DATE OF BIRTH:  21-Jan-1929  DATE OF ADMISSION:  03/19/2020   ADMITTING PHYSICIAN: Amy N Cox, DO  DATE OF DISCHARGE: 03/30/2020  4:09 PM  PRIMARY CARE PHYSICIAN: Glean Hess, MD   ADMISSION DIAGNOSIS:  Respiratory failure ([place]) [J96.90] Acute pulmonary edema ([place]) [J81.0] Acute respiratory failure with hypoxia ([place]) [J96.01] Diabetic ketoacidosis without coma associated with type 2 diabetes mellitus ([place]) [E11.10] Anemia, unspecified type [D64.9] DISCHARGE DIAGNOSIS:  Active Problems:   Respiratory failure (HCC)   Acute pulmonary edema (HCC)   Chronic kidney disease (CKD) stage G3b/A1, moderately decreased glomerular filtration rate (GFR) between 30-44 mL/min/1.73 square meter and albuminuria creatinine ratio less than 30 mg/g (HCC)   Diabetic ketoacidosis without coma associated with type 2 diabetes mellitus ([place])   Anemia  SECONDARY DIAGNOSIS:   Past Medical History:  Diagnosis Date  . Blind left eye   . Cataract   . Chronic kidney disease   . Diabetes mellitus without complication ([place])   . Hypertension   . Retinopathy    HOSPITAL COURSE:  Sheri Sanagustin Mitchellis a 87 y.o.femalebrought in by EMS on CPAP for shortness of breath/respiratory failure.  Acute hypoxic respiratory failure secondary to pulmonary edema/pulmonary hypertension with underlying chronic anemia and mitral regurgitation - respiratory failure is now resolved Acute on chronic diastolic dysfunction Sepsis ruled out -- outpt Cardio f/up  Elevated troponin in the setting of congestive heart failure/pulmonary edema -- due to demand ischemia -- echo shows preserved LV systolic function.  Acute on CKD stage IIIb -- baseline creatinine 1.6 -- 1.8 - improved with hydration -- outpt f/up with Nephro  Anemia of chronic kidney disease -- hemoglobin stable -- she is [employer]'s  W[employer]  Hypertension-- controlled  Diabetes type II with chronic kidney disease non-insulin-dependent Constipation/gas pain -resolved   DISCHARGE CONDITIONS:  stale CONSULTS OBTAINED:   DRUG ALLERGIES:  No Known Allergies DISCHARGE MEDICATIONS:   Allergies as of 03/30/2020   No Known Allergies     Medication List    STOP taking these medications   lisinopril 20 MG tablet Commonly known as: ZESTRIL     TAKE these medications   amLODipine 2.5 MG tablet Commonly known as: NORVASC Take 1 tablet (2.5 mg total) by mouth daily. What changed:   medication strength  See the new instructions.   augmented betamethasone dipropionate 0.05 % ointment Commonly known as: DIPROLENE-AF Apply 1 application topically 2 (two) times daily. To lesions on leg   Bystolic 10 MG tablet Generic drug: nebivolol Take 1 tablet (10 mg total) by mouth daily. What changed: when to take this   glipiZIDE 10 MG 24 hr tablet Commonly known as: GLUCOTROL XL TAKE 1 TABLET(10 MG) BY MOUTH DAILY WITH BREAKFAST   triamcinolone ointment 0.1 % Commonly known as: KENALOG Apply 1 application topically 2 (two) times daily.      DISCHARGE INSTRUCTIONS:   DIET:  Renal diet DISCHARGE CONDITION:  Stable ACTIVITY:  Activity as tolerated OXYGEN:  Home Oxygen: No.  Oxygen Delivery: room air DISCHARGE LOCATION:  Home with [place] - Palliative care to follow   If you experience worsening of your admission symptoms, develop shortness of breath, life threatening emergency, suicidal or homicidal thoughts you must seek medical attention immediately by calling 911 or calling your MD immediately  if symptoms less severe.  You Must read complete instructions/literature along with all the possible adverse reactions/side effects for all  the Medicines you take and that have been prescribed to you. Take any new Medicines after you have completely understood and accpet all the possible adverse reactions/side  effects.   Please note  You were cared for by a hospitalist during your hospital stay. If you have any questions about your discharge medications or the care you received while you were in the hospital after you are discharged, you can call the unit and asked to speak with the hospitalist on call if the hospitalist that took care of you is not available. Once you are discharged, your primary care physician will handle any further medical issues. Please note that NO REFILLS for any discharge medications will be authorized once you are discharged, as it is imperative that you return to your primary care physician (or establish a relationship with a primary care physician if you do not have one) for your aftercare needs so that they can reassess your need for medications and monitor your lab values.    On the day of Discharge:  VITAL SIGNS:  Blood pressure (!) 139/56, pulse 73, temperature 97.7 F (36.5 C), temperature source Oral, resp. rate 18, height 5\' 5"  (1.651 m), weight 71.7 kg, SpO2 99 %. PHYSICAL EXAMINATION:  GENERAL:  87 y.o.-year-old patient lying in the bed with no acute distress.  EYES: Pupils equal, round, reactive to light and accommodation. No scleral icterus. Extraocular muscles intact.  HEENT: Head atraumatic, normocephalic. Oropharynx and nasopharynx clear.  NECK:  Supple, no jugular venous distention. No thyroid enlargement, no tenderness.  LUNGS: Normal breath sounds bilaterally, no wheezing, rales,rhonchi or crepitation. No use of accessory muscles of respiration.  CARDIOVASCULAR: S1, S2 normal. No murmurs, rubs, or gallops.  ABDOMEN: Soft, non-tender, non-distended. Bowel sounds present. No organomegaly or mass.  EXTREMITIES: No pedal edema, cyanosis, or clubbing.  NEUROLOGIC: Cranial nerves II through XII are intact. Muscle strength 5/5 in all extremities. Sensation intact. Gait not checked.  PSYCHIATRIC: The patient is alert and oriented x 3.  SKIN: No obvious rash,  lesion, or ulcer.  DATA REVIEW:   CBC Recent Labs  Lab 03/30/20 0558  WBC 9.0  HGB 8.1*  HCT 25.1*  PLT 280    Chemistries  Recent Labs  Lab 03/26/20 0452 03/27/20 0831 03/28/20 1130 03/29/20 0550 03/30/20 0558  NA 135   < > 135   < > 136  K 3.8   < > 4.9   < > 4.2  CL 101   < > 100   < > 103  CO2 24   < > 25   < > 24  GLUCOSE 107*   < > 198*   < > 183*  BUN 56*   < > 77*   < > 85*  CREATININE 2.39*   < > 3.32*   < > 2.83*  CALCIUM 8.5*   < > 9.1   < > 8.6*  MG 2.4  --   --   --   --   AST  --   --  31  --   --   ALT  --   --  30  --   --   ALKPHOS  --   --  63  --   --   BILITOT  --   --  0.6  --   --    < > = values in this interval not displayed.     Outpatient follow-up  Follow-up Information    Glean Hess, MD. Schedule  an appointment as soon as possible for a visit in 1 week(s).   Specialty: Internal Medicine Why: the office will call you with an appointment.  Contact information: [address] [place] [address] [phone]        Wellington Hampshire, MD. Schedule an appointment as soon as possible for a visit in 2 week(s).   Specialty: Cardiology Why: Christell Faith April 6th, 9:30am Contact information: [address] [place] [address] [phone]        Lyla Son, MD. Go on 04/20/2020.   Specialty: Nephrology Why: as scheduled Contact information: [place] [address] [phone]               66 Day Unplanned Readmission Risk Score   Flowsheet Row ED to Hosp-Admission (Discharged) from 03/19/2020 in [facility]  30 Day Unplanned Readmission Risk Score (%) 15 Filed at 03/30/2020 1600     This score is the patient's risk of an unplanned readmission within 30 days of being discharged (0 -100%). The score is based on dignosis, age, lab data, medications, orders, and past utilization.   Low:  0-14.9   Medium: 15-21.9    High: 22-29.9   Extreme: 30 and above         Management plans discussed with the patient, family and they are in agreement.  CODE STATUS: Prior   TOTAL TIME TAKING CARE OF THIS PATIENT: 45 minutes.    Max Sane M.D on 04/01/2020 at 7:17 PM  [facility]   CC: Primary care physician; Glean Hess, MD   Note: This dictation was prepared with Dragon dictation along with smaller phrase technology. Any transcriptional errors that result from this process are unintentional.

## 2020-04-01 NOTE — Telephone Encounter (Signed)
Called patient's home number to offer to schedule a Palliative Consult, no answer and mailbox was full.    I then called pt's cell with no answer - left message requesting a return call to schedule visit and/or to answer any questions regarding Palliative services.

## 2020-04-02 ENCOUNTER — Other Ambulatory Visit: Payer: Self-pay | Admitting: Internal Medicine

## 2020-04-02 DIAGNOSIS — E118 Type 2 diabetes mellitus with unspecified complications: Secondary | ICD-10-CM

## 2020-04-02 NOTE — Telephone Encounter (Signed)
Requested medication (s) are due for refill today: yes  Requested medication (s) are on the active medication list: yes  Last refill:  12/11/19  Future visit scheduled: no  Notes to clinic:  last HGBA1C: 03/25/20-    Requested Prescriptions  Pending Prescriptions Disp Refills   glipiZIDE (GLUCOTROL XL) 10 MG 24 hr tablet [Pharmacy Med Name: GLIPIZIDE ER 10MG  TABLETS] 90 tablet 0    Sig: TAKE 1 TABLET(10 MG) BY MOUTH DAILY WITH BREAKFAST      Endocrinology:  Diabetes - Sulfonylureas Failed - 04/02/2020  9:15 AM      Failed - Valid encounter within last 6 months    Recent Outpatient Visits           7 months ago Essential (primary) hypertension   [facility] Glean Hess, MD   8 months ago pain in finger   [facility] Glean Hess, MD   1 year ago Hyperlipidemia associated with type 2 diabetes mellitus [facility])   [facility] Glean Hess, MD   2 years ago Essential (primary) hypertension   [facility] Glean Hess, MD   2 years ago Uncontrolled type 2 diabetes mellitus with chronic kidney disease, without long-term current use of insulin [facility])   [facility] Glean Hess, MD       Future Appointments             In 2 weeks Dunn, Areta Haber, PA-C [place]             Passed - HBA1C is between 0 and 7.9 and within 180 days    Hgb A1c MFr Bld  Date Value Ref Range Status  03/25/2020 7.1 (H) 4.8 - 5.6 % Final    Comment:    (NOTE) Pre diabetes:          5.7%-6.4%  Diabetes:              >6.4%  Glycemic control for   <7.0% adults with diabetes

## 2020-04-04 DIAGNOSIS — N189 Chronic kidney disease, unspecified: Secondary | ICD-10-CM | POA: Diagnosis not present

## 2020-04-04 DIAGNOSIS — E785 Hyperlipidemia, unspecified: Secondary | ICD-10-CM | POA: Diagnosis not present

## 2020-04-04 DIAGNOSIS — I509 Heart failure, unspecified: Secondary | ICD-10-CM | POA: Diagnosis not present

## 2020-04-04 DIAGNOSIS — I130 Hypertensive heart and chronic kidney disease with heart failure and stage 1 through stage 4 chronic kidney disease, or unspecified chronic kidney disease: Secondary | ICD-10-CM | POA: Diagnosis not present

## 2020-04-04 DIAGNOSIS — J9600 Acute respiratory failure, unspecified whether with hypoxia or hypercapnia: Secondary | ICD-10-CM | POA: Diagnosis not present

## 2020-04-04 DIAGNOSIS — E1122 Type 2 diabetes mellitus with diabetic chronic kidney disease: Secondary | ICD-10-CM | POA: Diagnosis not present

## 2020-04-04 NOTE — Telephone Encounter (Signed)
Left message on voicemail to call to set up appointment for DM/HTN

## 2020-04-04 NOTE — Telephone Encounter (Signed)
Please call pt to schedule an appt for DM and HTN.  KP

## 2020-04-05 DIAGNOSIS — I272 Pulmonary hypertension, unspecified: Secondary | ICD-10-CM | POA: Diagnosis not present

## 2020-04-05 DIAGNOSIS — N1832 Chronic kidney disease, stage 3b: Secondary | ICD-10-CM | POA: Diagnosis not present

## 2020-04-05 DIAGNOSIS — I5033 Acute on chronic diastolic (congestive) heart failure: Secondary | ICD-10-CM | POA: Diagnosis not present

## 2020-04-05 DIAGNOSIS — N179 Acute kidney failure, unspecified: Secondary | ICD-10-CM | POA: Diagnosis not present

## 2020-04-05 DIAGNOSIS — E1122 Type 2 diabetes mellitus with diabetic chronic kidney disease: Secondary | ICD-10-CM | POA: Diagnosis not present

## 2020-04-05 DIAGNOSIS — I110 Hypertensive heart disease with heart failure: Secondary | ICD-10-CM | POA: Diagnosis not present

## 2020-04-06 DIAGNOSIS — I5033 Acute on chronic diastolic (congestive) heart failure: Secondary | ICD-10-CM | POA: Diagnosis not present

## 2020-04-06 DIAGNOSIS — N1832 Chronic kidney disease, stage 3b: Secondary | ICD-10-CM | POA: Diagnosis not present

## 2020-04-06 DIAGNOSIS — E1122 Type 2 diabetes mellitus with diabetic chronic kidney disease: Secondary | ICD-10-CM | POA: Diagnosis not present

## 2020-04-06 DIAGNOSIS — I110 Hypertensive heart disease with heart failure: Secondary | ICD-10-CM | POA: Diagnosis not present

## 2020-04-06 DIAGNOSIS — N179 Acute kidney failure, unspecified: Secondary | ICD-10-CM | POA: Diagnosis not present

## 2020-04-06 DIAGNOSIS — I272 Pulmonary hypertension, unspecified: Secondary | ICD-10-CM | POA: Diagnosis not present

## 2020-04-07 ENCOUNTER — Telehealth: Payer: Self-pay | Admitting: Primary Care

## 2020-04-07 NOTE — Telephone Encounter (Signed)
Called patient to offer to schedule a Palliative Consult, no answer and unable to leave a message due to mailbox was full.  Called husband's cell, no answer - left message with reason for call along with my name and call back number, requesting a return call to schedule visit.

## 2020-04-08 ENCOUNTER — Inpatient Hospital Stay: Payer: PRIVATE HEALTH INSURANCE | Admitting: Internal Medicine

## 2020-04-08 DIAGNOSIS — I5033 Acute on chronic diastolic (congestive) heart failure: Secondary | ICD-10-CM | POA: Diagnosis not present

## 2020-04-08 DIAGNOSIS — N179 Acute kidney failure, unspecified: Secondary | ICD-10-CM | POA: Diagnosis not present

## 2020-04-08 DIAGNOSIS — N1832 Chronic kidney disease, stage 3b: Secondary | ICD-10-CM | POA: Diagnosis not present

## 2020-04-08 DIAGNOSIS — I272 Pulmonary hypertension, unspecified: Secondary | ICD-10-CM | POA: Diagnosis not present

## 2020-04-08 DIAGNOSIS — E1122 Type 2 diabetes mellitus with diabetic chronic kidney disease: Secondary | ICD-10-CM | POA: Diagnosis not present

## 2020-04-08 DIAGNOSIS — I110 Hypertensive heart disease with heart failure: Secondary | ICD-10-CM | POA: Diagnosis not present

## 2020-04-12 DIAGNOSIS — I272 Pulmonary hypertension, unspecified: Secondary | ICD-10-CM | POA: Diagnosis not present

## 2020-04-12 DIAGNOSIS — N179 Acute kidney failure, unspecified: Secondary | ICD-10-CM | POA: Diagnosis not present

## 2020-04-12 DIAGNOSIS — E1122 Type 2 diabetes mellitus with diabetic chronic kidney disease: Secondary | ICD-10-CM | POA: Diagnosis not present

## 2020-04-12 DIAGNOSIS — I110 Hypertensive heart disease with heart failure: Secondary | ICD-10-CM | POA: Diagnosis not present

## 2020-04-12 DIAGNOSIS — N1832 Chronic kidney disease, stage 3b: Secondary | ICD-10-CM | POA: Diagnosis not present

## 2020-04-12 DIAGNOSIS — I5033 Acute on chronic diastolic (congestive) heart failure: Secondary | ICD-10-CM | POA: Diagnosis not present

## 2020-04-13 DIAGNOSIS — E1122 Type 2 diabetes mellitus with diabetic chronic kidney disease: Secondary | ICD-10-CM | POA: Diagnosis not present

## 2020-04-13 DIAGNOSIS — I110 Hypertensive heart disease with heart failure: Secondary | ICD-10-CM | POA: Diagnosis not present

## 2020-04-13 DIAGNOSIS — N1832 Chronic kidney disease, stage 3b: Secondary | ICD-10-CM | POA: Diagnosis not present

## 2020-04-13 DIAGNOSIS — N179 Acute kidney failure, unspecified: Secondary | ICD-10-CM | POA: Diagnosis not present

## 2020-04-13 DIAGNOSIS — I5033 Acute on chronic diastolic (congestive) heart failure: Secondary | ICD-10-CM | POA: Diagnosis not present

## 2020-04-13 DIAGNOSIS — I272 Pulmonary hypertension, unspecified: Secondary | ICD-10-CM | POA: Diagnosis not present

## 2020-04-14 DIAGNOSIS — I110 Hypertensive heart disease with heart failure: Secondary | ICD-10-CM | POA: Diagnosis not present

## 2020-04-14 DIAGNOSIS — I5033 Acute on chronic diastolic (congestive) heart failure: Secondary | ICD-10-CM | POA: Diagnosis not present

## 2020-04-14 DIAGNOSIS — N179 Acute kidney failure, unspecified: Secondary | ICD-10-CM | POA: Diagnosis not present

## 2020-04-14 DIAGNOSIS — E1122 Type 2 diabetes mellitus with diabetic chronic kidney disease: Secondary | ICD-10-CM | POA: Diagnosis not present

## 2020-04-14 DIAGNOSIS — N1832 Chronic kidney disease, stage 3b: Secondary | ICD-10-CM | POA: Diagnosis not present

## 2020-04-14 DIAGNOSIS — I272 Pulmonary hypertension, unspecified: Secondary | ICD-10-CM | POA: Diagnosis not present

## 2020-04-15 DIAGNOSIS — I272 Pulmonary hypertension, unspecified: Secondary | ICD-10-CM | POA: Diagnosis not present

## 2020-04-15 DIAGNOSIS — E1122 Type 2 diabetes mellitus with diabetic chronic kidney disease: Secondary | ICD-10-CM | POA: Diagnosis not present

## 2020-04-15 DIAGNOSIS — I110 Hypertensive heart disease with heart failure: Secondary | ICD-10-CM | POA: Diagnosis not present

## 2020-04-15 DIAGNOSIS — N1832 Chronic kidney disease, stage 3b: Secondary | ICD-10-CM | POA: Diagnosis not present

## 2020-04-15 DIAGNOSIS — I5033 Acute on chronic diastolic (congestive) heart failure: Secondary | ICD-10-CM | POA: Diagnosis not present

## 2020-04-15 DIAGNOSIS — N179 Acute kidney failure, unspecified: Secondary | ICD-10-CM | POA: Diagnosis not present

## 2020-04-18 DIAGNOSIS — D631 Anemia in chronic kidney disease: Secondary | ICD-10-CM | POA: Diagnosis not present

## 2020-04-18 DIAGNOSIS — I272 Pulmonary hypertension, unspecified: Secondary | ICD-10-CM | POA: Diagnosis not present

## 2020-04-18 DIAGNOSIS — I5033 Acute on chronic diastolic (congestive) heart failure: Secondary | ICD-10-CM | POA: Diagnosis not present

## 2020-04-18 DIAGNOSIS — I110 Hypertensive heart disease with heart failure: Secondary | ICD-10-CM | POA: Diagnosis not present

## 2020-04-18 DIAGNOSIS — E1122 Type 2 diabetes mellitus with diabetic chronic kidney disease: Secondary | ICD-10-CM | POA: Diagnosis not present

## 2020-04-18 DIAGNOSIS — N1832 Chronic kidney disease, stage 3b: Secondary | ICD-10-CM | POA: Diagnosis not present

## 2020-04-18 DIAGNOSIS — N179 Acute kidney failure, unspecified: Secondary | ICD-10-CM | POA: Diagnosis not present

## 2020-04-19 DIAGNOSIS — N1832 Chronic kidney disease, stage 3b: Secondary | ICD-10-CM | POA: Diagnosis not present

## 2020-04-19 DIAGNOSIS — N179 Acute kidney failure, unspecified: Secondary | ICD-10-CM | POA: Diagnosis not present

## 2020-04-19 DIAGNOSIS — I110 Hypertensive heart disease with heart failure: Secondary | ICD-10-CM | POA: Diagnosis not present

## 2020-04-19 DIAGNOSIS — I5033 Acute on chronic diastolic (congestive) heart failure: Secondary | ICD-10-CM | POA: Diagnosis not present

## 2020-04-19 DIAGNOSIS — I272 Pulmonary hypertension, unspecified: Secondary | ICD-10-CM | POA: Diagnosis not present

## 2020-04-19 DIAGNOSIS — E1122 Type 2 diabetes mellitus with diabetic chronic kidney disease: Secondary | ICD-10-CM | POA: Diagnosis not present
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
## 2020-04-20 ENCOUNTER — Other Ambulatory Visit
Admission: RE | Admit: 2020-04-20 | Discharge: 2020-04-20 | Disposition: A | Discharge status: Home / Self Care | Payer: Medicare Other | Source: Ambulatory Visit | Attending: Physician Assistant | Admitting: Physician Assistant

## 2020-04-20 ENCOUNTER — Other Ambulatory Visit: Payer: Self-pay

## 2020-04-20 ENCOUNTER — Ambulatory Visit (INDEPENDENT_AMBULATORY_CARE_PROVIDER_SITE_OTHER): Payer: Medicare Other | Admitting: Physician Assistant

## 2020-04-20 ENCOUNTER — Encounter: Payer: Self-pay | Admitting: Physician Assistant

## 2020-04-20 ENCOUNTER — Telehealth: Payer: Self-pay | Admitting: Licensed Clinical Social Worker

## 2020-04-20 ENCOUNTER — Ambulatory Visit: Payer: Medicare Other | Admitting: Physician Assistant

## 2020-04-20 VITALS — BP 190/70 | HR 78 | Ht 65.5 in | Wt 161.4 lb

## 2020-04-20 DIAGNOSIS — I10 Essential (primary) hypertension: Secondary | ICD-10-CM

## 2020-04-20 DIAGNOSIS — D638 Anemia in other chronic diseases classified elsewhere: Secondary | ICD-10-CM | POA: Diagnosis not present

## 2020-04-20 DIAGNOSIS — I340 Nonrheumatic mitral (valve) insufficiency: Secondary | ICD-10-CM

## 2020-04-20 DIAGNOSIS — N183 Chronic kidney disease, stage 3 unspecified: Secondary | ICD-10-CM

## 2020-04-20 DIAGNOSIS — I5033 Acute on chronic diastolic (congestive) heart failure: Secondary | ICD-10-CM

## 2020-04-20 DIAGNOSIS — E8809 Other disorders of plasma-protein metabolism, not elsewhere classified: Secondary | ICD-10-CM | POA: Diagnosis not present

## 2020-04-20 DIAGNOSIS — I272 Pulmonary hypertension, unspecified: Secondary | ICD-10-CM

## 2020-04-20 DIAGNOSIS — R778 Other specified abnormalities of plasma proteins: Secondary | ICD-10-CM

## 2020-04-20 DIAGNOSIS — IMO0001 Reserved for inherently not codable concepts without codable children: Secondary | ICD-10-CM

## 2020-04-20 DIAGNOSIS — Z789 Other specified health status: Secondary | ICD-10-CM | POA: Diagnosis not present

## 2020-04-20 LAB — COMPREHENSIVE METABOLIC PANEL WITH GFR
ALT: 13 U/L (ref 0–44)
AST: 17 U/L (ref 15–41)
Albumin: 3.8 g/dL (ref 3.5–5.0)
Alkaline Phosphatase: 82 U/L (ref 38–126)
Anion gap: 9 (ref 5–15)
BUN: 26 mg/dL — ABNORMAL HIGH (ref 8–23)
CO2: 21 mmol/L — ABNORMAL LOW (ref 22–32)
Calcium: 9.1 mg/dL (ref 8.9–10.3)
Chloride: 98 mmol/L (ref 98–111)
Creatinine, Ser: 1.46 mg/dL — ABNORMAL HIGH (ref 0.44–1.00)
GFR, Estimated: 34 mL/min — ABNORMAL LOW
Glucose, Bld: 145 mg/dL — ABNORMAL HIGH (ref 70–99)
Potassium: 4.5 mmol/L (ref 3.5–5.1)
Sodium: 128 mmol/L — ABNORMAL LOW (ref 135–145)
Total Bilirubin: 0.5 mg/dL (ref 0.3–1.2)
Total Protein: 7.7 g/dL (ref 6.5–8.1)

## 2020-04-20 LAB — CBC WITH DIFFERENTIAL/PLATELET
Abs Immature Granulocytes: 0.03 10*3/uL (ref 0.00–0.07)
Basophils Absolute: 0.1 10*3/uL (ref 0.0–0.1)
Basophils Relative: 1 %
Eosinophils Absolute: 0.4 10*3/uL (ref 0.0–0.5)
Eosinophils Relative: 5 %
HCT: 28.4 % — ABNORMAL LOW (ref 36.0–46.0)
Hemoglobin: 9.1 g/dL — ABNORMAL LOW (ref 12.0–15.0)
Immature Granulocytes: 0 %
Lymphocytes Relative: 26 %
Lymphs Abs: 1.9 10*3/uL (ref 0.7–4.0)
MCH: 25.7 pg — ABNORMAL LOW (ref 26.0–34.0)
MCHC: 32 g/dL (ref 30.0–36.0)
MCV: 80.2 fL (ref 80.0–100.0)
Monocytes Absolute: 0.5 10*3/uL (ref 0.1–1.0)
Monocytes Relative: 7 %
Neutro Abs: 4.5 10*3/uL (ref 1.7–7.7)
Neutrophils Relative %: 61 %
Platelets: 257 10*3/uL (ref 150–400)
RBC: 3.54 MIL/uL — ABNORMAL LOW (ref 3.87–5.11)
RDW: 16.7 % — ABNORMAL HIGH (ref 11.5–15.5)
WBC: 7.4 10*3/uL (ref 4.0–10.5)
nRBC: 0 % (ref 0.0–0.2)

## 2020-04-20 MED ORDER — FUROSEMIDE 20 MG PO TABS: 20.0000 mg | ORAL_TABLET | Freq: Every day | ORAL | 1 refills | Status: DC

## 2020-04-20 MED ORDER — BYSTOLIC 10 MG PO TABS: 10.0000 mg | ORAL_TABLET | Freq: Two times a day (BID) | ORAL | 5 refills | Status: DC

## 2020-04-20 NOTE — Telephone Encounter (Signed)
CSW received consult to help pt with meal delivery program.  CSW reached out to pt who confirms that she and her husband have been ordering in all their food because they can no longer prepare their own food and struggle to get to the grocery store.  Discussed options with pt and was able to place [employer] referral through her [facility] benefit- Patient identified as a candidate to receive 7 heart healthy meals per week for 4 weeks through [facility]  Completed referral sent in for review.  Anticipate patient will receive first shipment of food in 1-3 business days.  CSW also made referral for pt spouse as he is in the same situation for her- he does not appear to be [facility] but referral placed and will await determination from [facility] regarding a delivery.  CSW also sent message to [employer] on Wheels to discuss referral since [employer] program is only temporary.  Will continue to follow and assist as needed   Jorge Ny, [facility] Desk#: [phone] Cell#: [phone]

## 2020-04-20 NOTE — Patient Instructions (Addendum)
Medication Instructions:  Your physician has recommended you make the following change in your medication:   INCREASE Bystolic to 10 mg twice daily. An Rx has been sent to your pharmacy.  START Lasix 40 mg (2 tablets) for 3 days. Then REDUCE Lasix to 20 mg (1 tablet) daily. An Rx has been sent to your pharmacy.  *If you need a refill on your cardiac medications before your next appointment, please call your pharmacy*   Lab Work: Cbc, Cmp today If you have labs (blood work) drawn today and your tests are completely normal, you will receive your results only by: Marland Kitchen MyChart Message (if you have MyChart) OR . A paper copy in the mail If you have any lab test that is abnormal or we need to change your treatment, we will call you to review the results.   Testing/Procedures: None ordered   Follow-Up: At [facility], you and your health needs are our priority.  As part of our continuing mission to provide you with exceptional heart care, we have created designated Provider Care Teams.  These Care Teams include your primary Cardiologist (physician) and Advanced Practice Providers (APPs -  Physician Assistants and Nurse Practitioners) who all work together to provide you with the care you need, when you need it.  We recommend signing up for the patient portal called "MyChart".  Sign up information is provided on this After Visit Summary.  MyChart is used to connect with patients for Virtual Visits (Telemedicine).  Patients are able to view lab/test results, encounter notes, upcoming appointments, etc.  Non-urgent messages can be sent to your provider as well.   To learn more about what you can do with MyChart, go to [email or web address].    Your next appointment:   1 week  The format for your next appointment:   In Person  Provider:   You may see Kathlyn Sacramento, MD or one of the following Advanced Practice Providers on your designated Care Team:    Murray Hodgkins, NP  Christell Faith,  PA-C  Marrianne Mood, PA-C  Cadence Lincoln, [place]  Laurann Montana, NP    Other Instructions Our Social Worker will reach out to you regarding resources for meal deliveries.

## 2020-04-21 ENCOUNTER — Telehealth: Payer: Self-pay | Admitting: *Deleted

## 2020-04-21 DIAGNOSIS — N1832 Chronic kidney disease, stage 3b: Secondary | ICD-10-CM | POA: Diagnosis not present

## 2020-04-21 DIAGNOSIS — E1122 Type 2 diabetes mellitus with diabetic chronic kidney disease: Secondary | ICD-10-CM | POA: Diagnosis not present

## 2020-04-21 DIAGNOSIS — I272 Pulmonary hypertension, unspecified: Secondary | ICD-10-CM | POA: Diagnosis not present

## 2020-04-21 DIAGNOSIS — I110 Hypertensive heart disease with heart failure: Secondary | ICD-10-CM | POA: Diagnosis not present

## 2020-04-21 DIAGNOSIS — N179 Acute kidney failure, unspecified: Secondary | ICD-10-CM | POA: Diagnosis not present

## 2020-04-21 DIAGNOSIS — I5033 Acute on chronic diastolic (congestive) heart failure: Secondary | ICD-10-CM | POA: Diagnosis not present

## 2020-04-21 NOTE — Telephone Encounter (Signed)
Spoke with patient and reviewed her lab results and recommendations. She verbalized understanding but did want to review what medications she should be taking. When reviewing recommendations to decrease fluid intake she reported drinking about 6 to 8 bottles of water per day. Advised she should cut that in half. She then wanted to know which changes she needed to be taking. Reviewed all medications in detail. Offered to send her My Chart message with list of medications and how she should be taking them. She was agreeable with this and was appreciative for the time explaining all of this information. Instructed her to call back if she has any further questions once she reads her message. She verbalized understanding of our conversation, agreement with plan, and had no further questions at this time.

## 2020-04-21 NOTE — Telephone Encounter (Signed)
-----   Message from Rise Mu, PA-C sent at 04/20/2020  4:36 PM EDT ----- Renal function continues to improve.  Potassium at goal.  Sodium is mildly low.  Protein level is improved and now normal.  Random glucose is elevated with known diabetes.  Please see prior result note regarding CBC.  Recommendations: -Recommend decreasing water intake/fluid restriction -Continue with planned outpatient diuresis -When she is seen in follow-up next week she will need a stat BMP to trend renal function, potassium, and sodium

## 2020-04-21 NOTE — Telephone Encounter (Signed)
Patient returning call.

## 2020-04-21 NOTE — Telephone Encounter (Signed)
Left voicemail message to call back for review of results and recommendations.  

## 2020-04-22 DIAGNOSIS — I5033 Acute on chronic diastolic (congestive) heart failure: Secondary | ICD-10-CM | POA: Diagnosis not present

## 2020-04-22 DIAGNOSIS — I110 Hypertensive heart disease with heart failure: Secondary | ICD-10-CM | POA: Diagnosis not present

## 2020-04-22 DIAGNOSIS — N179 Acute kidney failure, unspecified: Secondary | ICD-10-CM | POA: Diagnosis not present

## 2020-04-22 DIAGNOSIS — E1122 Type 2 diabetes mellitus with diabetic chronic kidney disease: Secondary | ICD-10-CM | POA: Diagnosis not present

## 2020-04-22 DIAGNOSIS — I272 Pulmonary hypertension, unspecified: Secondary | ICD-10-CM | POA: Diagnosis not present

## 2020-04-22 DIAGNOSIS — N1832 Chronic kidney disease, stage 3b: Secondary | ICD-10-CM | POA: Diagnosis not present

## 2020-04-26 DIAGNOSIS — N1832 Chronic kidney disease, stage 3b: Secondary | ICD-10-CM | POA: Diagnosis not present

## 2020-04-26 DIAGNOSIS — I110 Hypertensive heart disease with heart failure: Secondary | ICD-10-CM | POA: Diagnosis not present

## 2020-04-26 DIAGNOSIS — I272 Pulmonary hypertension, unspecified: Secondary | ICD-10-CM | POA: Diagnosis not present

## 2020-04-26 DIAGNOSIS — I5033 Acute on chronic diastolic (congestive) heart failure: Secondary | ICD-10-CM | POA: Diagnosis not present

## 2020-04-26 DIAGNOSIS — E1122 Type 2 diabetes mellitus with diabetic chronic kidney disease: Secondary | ICD-10-CM | POA: Diagnosis not present

## 2020-04-26 DIAGNOSIS — N179 Acute kidney failure, unspecified: Secondary | ICD-10-CM | POA: Diagnosis not present

## 2020-04-27 DIAGNOSIS — D631 Anemia in chronic kidney disease: Secondary | ICD-10-CM | POA: Diagnosis not present

## 2020-04-27 DIAGNOSIS — E1122 Type 2 diabetes mellitus with diabetic chronic kidney disease: Secondary | ICD-10-CM | POA: Diagnosis not present

## 2020-04-27 DIAGNOSIS — I509 Heart failure, unspecified: Secondary | ICD-10-CM | POA: Diagnosis not present

## 2020-04-27 DIAGNOSIS — I10 Essential (primary) hypertension: Secondary | ICD-10-CM | POA: Diagnosis not present

## 2020-04-27 DIAGNOSIS — R809 Proteinuria, unspecified: Secondary | ICD-10-CM | POA: Diagnosis not present
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
## 2020-04-28 ENCOUNTER — Telehealth: Payer: Self-pay | Admitting: Licensed Clinical Social Worker

## 2020-04-28 DIAGNOSIS — I5033 Acute on chronic diastolic (congestive) heart failure: Secondary | ICD-10-CM | POA: Diagnosis not present

## 2020-04-28 DIAGNOSIS — I110 Hypertensive heart disease with heart failure: Secondary | ICD-10-CM | POA: Diagnosis not present

## 2020-04-28 DIAGNOSIS — I272 Pulmonary hypertension, unspecified: Secondary | ICD-10-CM | POA: Diagnosis not present

## 2020-04-28 DIAGNOSIS — N179 Acute kidney failure, unspecified: Secondary | ICD-10-CM | POA: Diagnosis not present

## 2020-04-28 DIAGNOSIS — N1832 Chronic kidney disease, stage 3b: Secondary | ICD-10-CM | POA: Diagnosis not present

## 2020-04-28 DIAGNOSIS — E1122 Type 2 diabetes mellitus with diabetic chronic kidney disease: Secondary | ICD-10-CM | POA: Diagnosis not present

## 2020-04-28 NOTE — Telephone Encounter (Signed)
CSW called pt to check in regarding moms meals.  Pt reports she got the first delivery ok and that she has really enjoyed the food and is glad she has access to a healthy option instead of just getting take out all the time.  CSW had heard back from [employer] on Wheels program with information about how to refer to their program so CSW also discussed referral to Meals on Wheels for her and her husband.  Pt agreeable to referral so CSW called and placed pt and pt spouse on the waitlist.  No further needs at this time  Jorge Ny, [facility] Desk#: [phone] Cell#: [phone]

## 2020-04-29 ENCOUNTER — Encounter: Payer: Self-pay | Admitting: Physician Assistant

## 2020-04-29 ENCOUNTER — Ambulatory Visit (INDEPENDENT_AMBULATORY_CARE_PROVIDER_SITE_OTHER): Payer: Medicare Other | Admitting: Physician Assistant

## 2020-04-29 ENCOUNTER — Other Ambulatory Visit: Payer: Self-pay

## 2020-04-29 VITALS — BP 155/82 | HR 82 | Ht 65.5 in | Wt 159.2 lb

## 2020-04-29 DIAGNOSIS — I272 Pulmonary hypertension, unspecified: Secondary | ICD-10-CM | POA: Diagnosis not present

## 2020-04-29 DIAGNOSIS — Z789 Other specified health status: Secondary | ICD-10-CM | POA: Diagnosis not present

## 2020-04-29 DIAGNOSIS — D638 Anemia in other chronic diseases classified elsewhere: Secondary | ICD-10-CM | POA: Diagnosis not present

## 2020-04-29 DIAGNOSIS — I5032 Chronic diastolic (congestive) heart failure: Secondary | ICD-10-CM

## 2020-04-29 DIAGNOSIS — I340 Nonrheumatic mitral (valve) insufficiency: Secondary | ICD-10-CM

## 2020-04-29 DIAGNOSIS — R778 Other specified abnormalities of plasma proteins: Secondary | ICD-10-CM | POA: Diagnosis not present

## 2020-04-29 DIAGNOSIS — N183 Chronic kidney disease, stage 3 unspecified: Secondary | ICD-10-CM | POA: Diagnosis not present

## 2020-04-29 DIAGNOSIS — IMO0001 Reserved for inherently not codable concepts without codable children: Secondary | ICD-10-CM

## 2020-04-29 DIAGNOSIS — I10 Essential (primary) hypertension: Secondary | ICD-10-CM | POA: Diagnosis not present

## 2020-04-29 MED ORDER — HYDRALAZINE HCL 50 MG PO TABS: ORAL_TABLET | ORAL | 1 refills | Status: DC

## 2020-04-29 NOTE — Patient Instructions (Signed)
Medication Instructions:  - Your physician has recommended you make the following change in your medication:   1) INCREASE hydralazine 50 mg- take 1.5 tablets (75 mg) by mouth THREE times a day  *If you need a refill on your cardiac medications before your next appointment, please call your pharmacy*   Lab Work: - none ordered  If you have labs (blood work) drawn today and your tests are completely normal, you will receive your results only by: Marland Kitchen MyChart Message (if you have MyChart) OR . A paper copy in the mail If you have any lab test that is abnormal or we need to change your treatment, we will call you to review the results.   Testing/Procedures: - none ordered   Follow-Up: At [facility], you and your health needs are our priority.  As part of our continuing mission to provide you with exceptional heart care, we have created designated Provider Care Teams.  These Care Teams include your primary Cardiologist (physician) and Advanced Practice Providers (APPs -  Physician Assistants and Nurse Practitioners) who all work together to provide you with the care you need, when you need it.  We recommend signing up for the patient portal called "MyChart".  Sign up information is provided on this After Visit Summary.  MyChart is used to connect with patients for Virtual Visits (Telemedicine).  Patients are able to view lab/test results, encounter notes, upcoming appointments, etc.  Non-urgent messages can be sent to your provider as well.   To learn more about what you can do with MyChart, go to [email or web address].    Your next appointment:   1 month(s)  The format for your next appointment:   In Person  Provider:   You may see Kathlyn Sacramento, MD or one of the following Advanced Practice Providers on your designated Care Team:    Murray Hodgkins, NP  Christell Faith, PA-C  Marrianne Mood, PA-C  Cadence Gl[place], [place]  Laurann Montana, NP    Other Instructions n/a

## 2020-04-30 DIAGNOSIS — Z7984 Long term (current) use of oral hypoglycemic drugs: Secondary | ICD-10-CM | POA: Diagnosis not present

## 2020-04-30 DIAGNOSIS — E1122 Type 2 diabetes mellitus with diabetic chronic kidney disease: Secondary | ICD-10-CM | POA: Diagnosis not present

## 2020-04-30 DIAGNOSIS — Z87891 Personal history of nicotine dependence: Secondary | ICD-10-CM | POA: Diagnosis not present

## 2020-04-30 DIAGNOSIS — I110 Hypertensive heart disease with heart failure: Secondary | ICD-10-CM | POA: Diagnosis not present

## 2020-04-30 DIAGNOSIS — E1136 Type 2 diabetes mellitus with diabetic cataract: Secondary | ICD-10-CM | POA: Diagnosis not present

## 2020-04-30 DIAGNOSIS — K5900 Constipation, unspecified: Secondary | ICD-10-CM | POA: Diagnosis not present

## 2020-04-30 DIAGNOSIS — I051 Rheumatic mitral insufficiency: Secondary | ICD-10-CM | POA: Diagnosis not present

## 2020-04-30 DIAGNOSIS — I272 Pulmonary hypertension, unspecified: Secondary | ICD-10-CM | POA: Diagnosis not present

## 2020-04-30 DIAGNOSIS — N1832 Chronic kidney disease, stage 3b: Secondary | ICD-10-CM | POA: Diagnosis not present

## 2020-04-30 DIAGNOSIS — I5033 Acute on chronic diastolic (congestive) heart failure: Secondary | ICD-10-CM | POA: Diagnosis not present

## 2020-04-30 DIAGNOSIS — N179 Acute kidney failure, unspecified: Secondary | ICD-10-CM | POA: Diagnosis not present

## 2020-04-30 DIAGNOSIS — E11319 Type 2 diabetes mellitus with unspecified diabetic retinopathy without macular edema: Secondary | ICD-10-CM | POA: Diagnosis not present

## 2020-04-30 DIAGNOSIS — H5462 Unqualified visual loss, left eye, normal vision right eye: Secondary | ICD-10-CM | POA: Diagnosis not present

## 2020-04-30 DIAGNOSIS — Z9181 History of falling: Secondary | ICD-10-CM | POA: Diagnosis not present

## 2020-04-30 DIAGNOSIS — D631 Anemia in chronic kidney disease: Secondary | ICD-10-CM | POA: Diagnosis not present

## 2020-05-02 DIAGNOSIS — I5033 Acute on chronic diastolic (congestive) heart failure: Secondary | ICD-10-CM | POA: Diagnosis not present

## 2020-05-02 DIAGNOSIS — N179 Acute kidney failure, unspecified: Secondary | ICD-10-CM | POA: Diagnosis not present

## 2020-05-02 DIAGNOSIS — I272 Pulmonary hypertension, unspecified: Secondary | ICD-10-CM | POA: Diagnosis not present

## 2020-05-02 DIAGNOSIS — N1832 Chronic kidney disease, stage 3b: Secondary | ICD-10-CM | POA: Diagnosis not present

## 2020-05-02 DIAGNOSIS — E1122 Type 2 diabetes mellitus with diabetic chronic kidney disease: Secondary | ICD-10-CM | POA: Diagnosis not present

## 2020-05-02 DIAGNOSIS — I110 Hypertensive heart disease with heart failure: Secondary | ICD-10-CM | POA: Diagnosis not present

## 2020-05-03 ENCOUNTER — Telehealth: Payer: Self-pay | Admitting: Primary Care

## 2020-05-03 DIAGNOSIS — I110 Hypertensive heart disease with heart failure: Secondary | ICD-10-CM | POA: Diagnosis not present

## 2020-05-03 DIAGNOSIS — N1832 Chronic kidney disease, stage 3b: Secondary | ICD-10-CM | POA: Diagnosis not present

## 2020-05-03 DIAGNOSIS — E1122 Type 2 diabetes mellitus with diabetic chronic kidney disease: Secondary | ICD-10-CM | POA: Diagnosis not present

## 2020-05-03 DIAGNOSIS — I5033 Acute on chronic diastolic (congestive) heart failure: Secondary | ICD-10-CM | POA: Diagnosis not present

## 2020-05-03 DIAGNOSIS — N179 Acute kidney failure, unspecified: Secondary | ICD-10-CM | POA: Diagnosis not present

## 2020-05-03 DIAGNOSIS — I272 Pulmonary hypertension, unspecified: Secondary | ICD-10-CM | POA: Diagnosis not present

## 2020-05-03 NOTE — Telephone Encounter (Signed)
Spoke with patient and she was in agreement with scheduling an [place] for 05/12/20 @ 12:30 PM.

## 2020-05-10 DIAGNOSIS — I5033 Acute on chronic diastolic (congestive) heart failure: Secondary | ICD-10-CM | POA: Diagnosis not present

## 2020-05-10 DIAGNOSIS — N179 Acute kidney failure, unspecified: Secondary | ICD-10-CM | POA: Diagnosis not present

## 2020-05-10 DIAGNOSIS — E1122 Type 2 diabetes mellitus with diabetic chronic kidney disease: Secondary | ICD-10-CM | POA: Diagnosis not present

## 2020-05-10 DIAGNOSIS — I110 Hypertensive heart disease with heart failure: Secondary | ICD-10-CM | POA: Diagnosis not present

## 2020-05-10 DIAGNOSIS — N1832 Chronic kidney disease, stage 3b: Secondary | ICD-10-CM | POA: Diagnosis not present

## 2020-05-10 DIAGNOSIS — I272 Pulmonary hypertension, unspecified: Secondary | ICD-10-CM | POA: Diagnosis not present

## 2020-05-11 DIAGNOSIS — I5033 Acute on chronic diastolic (congestive) heart failure: Secondary | ICD-10-CM | POA: Diagnosis not present

## 2020-05-11 DIAGNOSIS — I110 Hypertensive heart disease with heart failure: Secondary | ICD-10-CM | POA: Diagnosis not present

## 2020-05-11 DIAGNOSIS — N1832 Chronic kidney disease, stage 3b: Secondary | ICD-10-CM | POA: Diagnosis not present

## 2020-05-11 DIAGNOSIS — E1122 Type 2 diabetes mellitus with diabetic chronic kidney disease: Secondary | ICD-10-CM | POA: Diagnosis not present

## 2020-05-11 DIAGNOSIS — I272 Pulmonary hypertension, unspecified: Secondary | ICD-10-CM | POA: Diagnosis not present

## 2020-05-11 DIAGNOSIS — N179 Acute kidney failure, unspecified: Secondary | ICD-10-CM | POA: Diagnosis not present

## 2020-05-12 ENCOUNTER — Other Ambulatory Visit: Payer: Self-pay | Admitting: Primary Care

## 2020-05-12 ENCOUNTER — Other Ambulatory Visit: Payer: Medicare Other | Admitting: Primary Care

## 2020-05-12 ENCOUNTER — Other Ambulatory Visit: Payer: Self-pay

## 2020-05-12 DIAGNOSIS — Z515 Encounter for palliative care: Secondary | ICD-10-CM

## 2020-05-12 DIAGNOSIS — Z7409 Other reduced mobility: Secondary | ICD-10-CM

## 2020-05-12 DIAGNOSIS — N179 Acute kidney failure, unspecified: Secondary | ICD-10-CM | POA: Diagnosis not present

## 2020-05-12 DIAGNOSIS — N1832 Chronic kidney disease, stage 3b: Secondary | ICD-10-CM | POA: Diagnosis not present

## 2020-05-12 DIAGNOSIS — I5033 Acute on chronic diastolic (congestive) heart failure: Secondary | ICD-10-CM | POA: Diagnosis not present

## 2020-05-12 DIAGNOSIS — I272 Pulmonary hypertension, unspecified: Secondary | ICD-10-CM | POA: Diagnosis not present

## 2020-05-12 DIAGNOSIS — I110 Hypertensive heart disease with heart failure: Secondary | ICD-10-CM | POA: Diagnosis not present

## 2020-05-12 DIAGNOSIS — E1122 Type 2 diabetes mellitus with diabetic chronic kidney disease: Secondary | ICD-10-CM | POA: Diagnosis not present

## 2020-05-12 NOTE — Progress Notes (Signed)
[facility] Palliative Care Consult Note Telephone: [phone]  Fax: [phone]    Date of encounter: 05/12/20 PATIENT NAME: Sheri Ryan [address] [place] [address]   [phone] (home)  DOB: 1929-02-23 MRN: [id number] PRIMARY CARE PROVIDER:    Leonel Ramsay, MD,  [place] [address] [phone]  REFERRING PROVIDER:   Leonel Ramsay, MD [place],  [place] [address] [phone]  RESPONSIBLE PARTY:    Contact Information    Name Relation Home Work [address]   Sheri Ryan, Sheri Ryan Son   [phone]   Sheri Ryan, Sheri Ryan [phone]  [phone]       I met face to face with patient and family in  home. Palliative Care was asked to follow this patient by consultation request of  Sheri Ramsay, MD to address advance care planning and complex medical decision making. This is the initial visit.                                     ASSESSMENT AND PLAN / RECOMMENDATIONS:   Advance Care Planning/Goals of Care: Goals include to maximize quality of life and symptom management. Our advance care planning conversation included a discussion about:     The value and importance of advance care planning   Exploration of personal, cultural or spiritual beliefs that might influence medical decisions   Exploration of goals of care in the event of a sudden injury or illness   Identification  of a healthcare agent - has husband and children  Review  of an  advance directive document .  CODE STATUS: FULL I discussed with patient about advanced directives. She stated she had Been visited in the hospital by the palliative nurse practitioner. She knows the reason was because her hemoglobin got low and due to her being Jehovah's witness she does not take blood products. We discuseds some of her beliefs in this category of what treatment she could take in line with her religion. She  was familiar with the most form but did not want to discuss it today. She did receive it and said she wanted to discuss it with her family.  Symptom Management/Plan:  Med review: Reviewed all meds. She is not taking the increase of hydralazine due to not being able to split hte 50 mg pill. She states she is just not taking it. I advised she could get a pill splitter or MD can order 25 mg pill to go with 50 mg.   Gait: Using a walker for safety in the house. She states she does not need it going out but uses a cane She has OT currently which is  Doing rehab activities with her. She does not know the company. Denies falls. Does endorse DOE and fatigue  Caregiver: she and husband live in home with adult children looking in on them frequently. Patient states she has a Chartered certified accountant 2 days a month and does not need help with personal care.   Follow up Palliative Care Visit: Palliative care will continue to follow for complex medical decision making, advance care planning, and clarification of goals. Return 3 months or prn.  I spent 60 minutes providing this consultation. More than 50% of the time in this consultation was spent in counseling and care coordination.  PPS: 40%  HOSPICE ELIGIBILITY/DIAGNOSIS: TBD  Chief Complaint: fatigue  HISTORY OF PRESENT  ILLNESS:  Sheri Ryan is a 87 y.o. year old female  with fatigue,CDK, OA, DM, recent acute pulmonary edema and gait abnormality. History obtained from review of EMR, discussion with primary team, and interview with family, facility staff/caregiver and/or [name].  I reviewed available labs, medications, imaging, studies and related documents from the EMR.  Records reviewed and summarized above.   ROS   General: NAD EYES: denies vision changes ENMT: denies dysphagia Cardiovascular: denies chest pain, endorses DOE Pulmonary: denies cough, denies increased SOB Abdomen: endorses good appetite, denies constipation, endorses  continence of bowel GU: denies dysuria, endorses continence of urine MSK:  Endorses  weakness,  no falls reported Skin: denies rashes or wounds Neurological: denies pain, denies insomnia Psych: Endorses positive mood Heme/lymph/immuno: denies bruises, abnormal bleeding  Physical Exam: Current and past weights: 159 lbs Constitutional: NAD General: frail appearing EYES: anicteric sclera, lids intact, no discharge  ENMT: intact hearing, oral mucous membranes moist, dentition intact CV:  no LE edema Pulmonary:no increased work of breathing, no cough, room air Abdomen: intake  75%, no ascites MSK: moderate sarcopenia, moves all extremities, ambulatory Skin: warm and dry, no rashes or wounds on visible skin Neuro:  Moderate  generalized weakness, no evident cognitive impairment Psych: non-anxious affect, A and O x 3 Hem/lymph/immuno: no widespread bruising   CURRENT PROBLEM LIST:  Patient Active Problem List   Diagnosis Date Noted  . Diabetic ketoacidosis without coma associated with type 2 diabetes mellitus ([place])   . Anemia   . Acute pulmonary edema (HCC)   . Chronic kidney disease (CKD) stage G3b/A1, moderately decreased glomerular filtration rate (GFR) between 30-44 mL/min/1.73 square meter and albuminuria creatinine ratio less than 30 mg/g (HCC)   . Respiratory failure ([place]) 03/19/2020  . Pain of finger joint 08/07/2019  . Gouty arthritis of right great toe 02/24/2018  . Blindness of left eye 09/27/2017  . Non-toxic multinodular goiter 09/27/2017  . Left shoulder pain 04/30/2017  . Neck pain 04/30/2017  . Cervical radiculopathy at C6 04/23/2017  . Cervical disc disorder at C5-C6 level with radiculopathy 04/15/2017  . Type II diabetes mellitus with complication ([place]) 89/37/3428  . Impaired functional mobility, balance, gait, and endurance 01/07/2015  . Hypertrophic lichen planus 76/81/1572  . Hyperlipidemia associated with type 2 diabetes mellitus ([place]) 06/27/2014  .  Essential (primary) hypertension 06/27/2014  . Acid reflux 06/27/2014  . Arthritis of knee, degenerative 06/27/2014  . Hyperlipidemia 10/12/2011   PAST MEDICAL HISTORY:  Active Ambulatory Problems    Diagnosis Date Noted  . Hypertrophic lichen planus 62/03/5595  . Hyperlipidemia associated with type 2 diabetes mellitus ([place]) 06/27/2014  . Essential (primary) hypertension 06/27/2014  . Acid reflux 06/27/2014  . Arthritis of knee, degenerative 06/27/2014  . Type II diabetes mellitus with complication ([place]) 41/63/8453  . Impaired functional mobility, balance, gait, and endurance 01/07/2015  . Cervical disc disorder at C5-C6 level with radiculopathy 04/15/2017  . Cervical radiculopathy at C6 04/23/2017  . Hyperlipidemia 10/12/2011  . Left shoulder pain 04/30/2017  . Neck pain 04/30/2017  . Blindness of left eye 09/27/2017  . Non-toxic multinodular goiter 09/27/2017  . Gouty arthritis of right great toe 02/24/2018  . Pain of finger joint 08/07/2019  . Respiratory failure ([place]) 03/19/2020  . Acute pulmonary edema (HCC)   . Chronic kidney disease (CKD) stage G3b/A1, moderately decreased glomerular filtration rate (GFR) between 30-44 mL/min/1.73 square meter and albuminuria creatinine ratio less than 30 mg/g (HCC)   . Diabetic ketoacidosis without coma associated  with type 2 diabetes mellitus ([place])   . Anemia    Resolved Ambulatory Problems    Diagnosis Date Noted  . Diabetes ([place]) 06/27/2014  . Type 2 diabetes mellitus ([place]) 10/12/2011   Past Medical History:  Diagnosis Date  . Blind left eye   . Cataract   . Chronic kidney disease   . Diabetes mellitus without complication ([place])   . Hypertension   . Retinopathy    SOCIAL HX:  Social History   Tobacco Use  . Smoking status: Former Smoker    Packs/day: 1.00    Years: 9.00    Pack years: 9.00    Types: Cigarettes    Quit date: 1975    Years since quitting: 47.3  . Smokeless tobacco: Never Used  . Tobacco comment:  smoking cessation materials not required  Substance Use Topics  . Alcohol use: Not Currently   FAMILY HX:  Family History  Problem Relation Age of Onset  . Hypertension Mother   . Diabetes Brother       ALLERGIES: No Known Allergies   PERTINENT MEDICATIONS:  Outpatient Encounter Medications as of 05/12/2020  Medication Sig  . amLODipine (NORVASC) 10 MG tablet Take 1 tablet by mouth daily.  Marland Kitchen augmented betamethasone dipropionate (DIPROLENE-AF) 0.05 % ointment Apply 1 application topically 2 (two) times daily. To lesions on leg  . BYSTOLIC 10 MG tablet Take 1 tablet (10 mg total) by mouth in the morning and at bedtime.  . furosemide (LASIX) 20 MG tablet Take 1 tablet (20 mg total) by mouth daily.  Marland Kitchen glipiZIDE (GLUCOTROL XL) 10 MG 24 hr tablet TAKE 1 TABLET(10 MG) BY MOUTH DAILY WITH BREAKFAST  . hydrALAZINE (APRESOLINE) 50 MG tablet Take 1.5 tablets (75 mg) by mouth three times a day  . triamcinolone ointment (KENALOG) 0.1 % Apply 1 application topically 2 (two) times daily.  . [DISCONTINUED] amLODipine (NORVASC) 10 MG tablet Take 10 mg by mouth daily.   No facility-administered encounter medications on file as of 05/12/2020.    Thank you for the opportunity to participate in the care of [name].  The palliative care team will continue to follow. Please call our office at [phone] if we can be of additional assistance.   Jason Coop, NP , DNP, MPH, AGPCNP-BC, ACHPN  COVID-19 PATIENT SCREENING TOOL Asked and negative response unless otherwise noted:   Have you had symptoms of covid, tested positive or been in contact with someone with symptoms/positive test in the past 5-10 days?

## 2020-05-17 DIAGNOSIS — I110 Hypertensive heart disease with heart failure: Secondary | ICD-10-CM | POA: Diagnosis not present

## 2020-05-17 DIAGNOSIS — E1122 Type 2 diabetes mellitus with diabetic chronic kidney disease: Secondary | ICD-10-CM | POA: Diagnosis not present

## 2020-05-17 DIAGNOSIS — I5033 Acute on chronic diastolic (congestive) heart failure: Secondary | ICD-10-CM | POA: Diagnosis not present

## 2020-05-17 DIAGNOSIS — N1832 Chronic kidney disease, stage 3b: Secondary | ICD-10-CM | POA: Diagnosis not present

## 2020-05-17 DIAGNOSIS — I272 Pulmonary hypertension, unspecified: Secondary | ICD-10-CM | POA: Diagnosis not present

## 2020-05-17 DIAGNOSIS — N179 Acute kidney failure, unspecified: Secondary | ICD-10-CM | POA: Diagnosis not present

## 2020-05-18 DIAGNOSIS — N1832 Chronic kidney disease, stage 3b: Secondary | ICD-10-CM | POA: Diagnosis not present

## 2020-05-18 DIAGNOSIS — I509 Heart failure, unspecified: Secondary | ICD-10-CM | POA: Diagnosis not present

## 2020-05-18 DIAGNOSIS — D631 Anemia in chronic kidney disease: Secondary | ICD-10-CM | POA: Diagnosis not present

## 2020-05-18 DIAGNOSIS — R609 Edema, unspecified: Secondary | ICD-10-CM | POA: Diagnosis not present

## 2020-05-18 DIAGNOSIS — I10 Essential (primary) hypertension: Secondary | ICD-10-CM | POA: Diagnosis not present

## 2020-05-18 DIAGNOSIS — R809 Proteinuria, unspecified: Secondary | ICD-10-CM | POA: Diagnosis not present

## 2020-05-18 DIAGNOSIS — E1122 Type 2 diabetes mellitus with diabetic chronic kidney disease: Secondary | ICD-10-CM | POA: Diagnosis not present

## 2020-05-19 DIAGNOSIS — I110 Hypertensive heart disease with heart failure: Secondary | ICD-10-CM | POA: Diagnosis not present

## 2020-05-19 DIAGNOSIS — N179 Acute kidney failure, unspecified: Secondary | ICD-10-CM | POA: Diagnosis not present

## 2020-05-19 DIAGNOSIS — E1122 Type 2 diabetes mellitus with diabetic chronic kidney disease: Secondary | ICD-10-CM | POA: Diagnosis not present

## 2020-05-19 DIAGNOSIS — I5033 Acute on chronic diastolic (congestive) heart failure: Secondary | ICD-10-CM | POA: Diagnosis not present

## 2020-05-19 DIAGNOSIS — N1832 Chronic kidney disease, stage 3b: Secondary | ICD-10-CM | POA: Diagnosis not present

## 2020-05-19 DIAGNOSIS — I272 Pulmonary hypertension, unspecified: Secondary | ICD-10-CM | POA: Diagnosis not present

## 2020-05-24 DIAGNOSIS — I272 Pulmonary hypertension, unspecified: Secondary | ICD-10-CM | POA: Diagnosis not present

## 2020-05-24 DIAGNOSIS — N179 Acute kidney failure, unspecified: Secondary | ICD-10-CM | POA: Diagnosis not present

## 2020-05-24 DIAGNOSIS — N1832 Chronic kidney disease, stage 3b: Secondary | ICD-10-CM | POA: Diagnosis not present

## 2020-05-24 DIAGNOSIS — E1122 Type 2 diabetes mellitus with diabetic chronic kidney disease: Secondary | ICD-10-CM | POA: Diagnosis not present

## 2020-05-24 DIAGNOSIS — I110 Hypertensive heart disease with heart failure: Secondary | ICD-10-CM | POA: Diagnosis not present

## 2020-05-24 DIAGNOSIS — I5033 Acute on chronic diastolic (congestive) heart failure: Secondary | ICD-10-CM | POA: Diagnosis not present

## 2020-05-26 DIAGNOSIS — N1832 Chronic kidney disease, stage 3b: Secondary | ICD-10-CM | POA: Diagnosis not present

## 2020-05-26 DIAGNOSIS — I5033 Acute on chronic diastolic (congestive) heart failure: Secondary | ICD-10-CM | POA: Diagnosis not present

## 2020-05-26 DIAGNOSIS — N179 Acute kidney failure, unspecified: Secondary | ICD-10-CM | POA: Diagnosis not present

## 2020-05-26 DIAGNOSIS — E1122 Type 2 diabetes mellitus with diabetic chronic kidney disease: Secondary | ICD-10-CM | POA: Diagnosis not present

## 2020-05-26 DIAGNOSIS — I110 Hypertensive heart disease with heart failure: Secondary | ICD-10-CM | POA: Diagnosis not present

## 2020-05-26 DIAGNOSIS — I272 Pulmonary hypertension, unspecified: Secondary | ICD-10-CM | POA: Diagnosis not present

## 2020-05-30 DIAGNOSIS — Z7984 Long term (current) use of oral hypoglycemic drugs: Secondary | ICD-10-CM | POA: Diagnosis not present

## 2020-05-30 DIAGNOSIS — Z9841 Cataract extraction status, right eye: Secondary | ICD-10-CM | POA: Diagnosis not present

## 2020-05-30 DIAGNOSIS — Z9842 Cataract extraction status, left eye: Secondary | ICD-10-CM | POA: Diagnosis not present

## 2020-05-30 DIAGNOSIS — N1832 Chronic kidney disease, stage 3b: Secondary | ICD-10-CM | POA: Diagnosis not present

## 2020-05-30 DIAGNOSIS — I5033 Acute on chronic diastolic (congestive) heart failure: Secondary | ICD-10-CM | POA: Diagnosis not present

## 2020-05-30 DIAGNOSIS — H5462 Unqualified visual loss, left eye, normal vision right eye: Secondary | ICD-10-CM | POA: Diagnosis not present

## 2020-05-30 DIAGNOSIS — Z9181 History of falling: Secondary | ICD-10-CM | POA: Diagnosis not present

## 2020-05-30 DIAGNOSIS — N179 Acute kidney failure, unspecified: Secondary | ICD-10-CM | POA: Diagnosis not present

## 2020-05-30 DIAGNOSIS — I110 Hypertensive heart disease with heart failure: Secondary | ICD-10-CM | POA: Diagnosis not present

## 2020-05-30 DIAGNOSIS — E1136 Type 2 diabetes mellitus with diabetic cataract: Secondary | ICD-10-CM | POA: Diagnosis not present

## 2020-05-30 DIAGNOSIS — I272 Pulmonary hypertension, unspecified: Secondary | ICD-10-CM | POA: Diagnosis not present

## 2020-05-30 DIAGNOSIS — D631 Anemia in chronic kidney disease: Secondary | ICD-10-CM | POA: Diagnosis not present

## 2020-05-30 DIAGNOSIS — E11319 Type 2 diabetes mellitus with unspecified diabetic retinopathy without macular edema: Secondary | ICD-10-CM | POA: Diagnosis not present

## 2020-05-30 DIAGNOSIS — K5900 Constipation, unspecified: Secondary | ICD-10-CM | POA: Diagnosis not present

## 2020-05-30 DIAGNOSIS — Z87891 Personal history of nicotine dependence: Secondary | ICD-10-CM | POA: Diagnosis not present

## 2020-05-30 DIAGNOSIS — I051 Rheumatic mitral insufficiency: Secondary | ICD-10-CM | POA: Diagnosis not present

## 2020-05-30 DIAGNOSIS — E1122 Type 2 diabetes mellitus with diabetic chronic kidney disease: Secondary | ICD-10-CM | POA: Diagnosis not present

## 2020-05-31 DIAGNOSIS — E1122 Type 2 diabetes mellitus with diabetic chronic kidney disease: Secondary | ICD-10-CM | POA: Diagnosis not present

## 2020-05-31 DIAGNOSIS — I5033 Acute on chronic diastolic (congestive) heart failure: Secondary | ICD-10-CM | POA: Diagnosis not present

## 2020-05-31 DIAGNOSIS — I110 Hypertensive heart disease with heart failure: Secondary | ICD-10-CM | POA: Diagnosis not present

## 2020-05-31 DIAGNOSIS — I272 Pulmonary hypertension, unspecified: Secondary | ICD-10-CM | POA: Diagnosis not present

## 2020-05-31 DIAGNOSIS — N1832 Chronic kidney disease, stage 3b: Secondary | ICD-10-CM | POA: Diagnosis not present

## 2020-05-31 DIAGNOSIS — N179 Acute kidney failure, unspecified: Secondary | ICD-10-CM | POA: Diagnosis not present
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
## 2020-06-06 ENCOUNTER — Encounter: Payer: Self-pay | Admitting: Physician Assistant

## 2020-06-06 ENCOUNTER — Ambulatory Visit (INDEPENDENT_AMBULATORY_CARE_PROVIDER_SITE_OTHER): Payer: Medicare Other | Admitting: Physician Assistant

## 2020-06-06 ENCOUNTER — Other Ambulatory Visit: Payer: Self-pay

## 2020-06-06 VITALS — BP 180/82 | HR 82 | Ht 65.5 in | Wt 159.0 lb

## 2020-06-06 DIAGNOSIS — I272 Pulmonary hypertension, unspecified: Secondary | ICD-10-CM | POA: Diagnosis not present

## 2020-06-06 DIAGNOSIS — D638 Anemia in other chronic diseases classified elsewhere: Secondary | ICD-10-CM

## 2020-06-06 DIAGNOSIS — I10 Essential (primary) hypertension: Secondary | ICD-10-CM | POA: Diagnosis not present

## 2020-06-06 DIAGNOSIS — N183 Chronic kidney disease, stage 3 unspecified: Secondary | ICD-10-CM

## 2020-06-06 DIAGNOSIS — I340 Nonrheumatic mitral (valve) insufficiency: Secondary | ICD-10-CM

## 2020-06-06 DIAGNOSIS — I5032 Chronic diastolic (congestive) heart failure: Secondary | ICD-10-CM | POA: Diagnosis not present

## 2020-06-06 DIAGNOSIS — R778 Other specified abnormalities of plasma proteins: Secondary | ICD-10-CM | POA: Diagnosis not present

## 2020-06-06 DIAGNOSIS — Z789 Other specified health status: Secondary | ICD-10-CM

## 2020-06-06 DIAGNOSIS — IMO0001 Reserved for inherently not codable concepts without codable children: Secondary | ICD-10-CM

## 2020-06-06 MED ORDER — BYSTOLIC 20 MG PO TABS: 20.0000 mg | ORAL_TABLET | Freq: Every day | ORAL | 3 refills | Status: DC

## 2020-06-06 NOTE — Patient Instructions (Signed)
Medication Instructions:  Your physician has recommended you make the following change in your medication:   1. INCREASE Bystolic 20 mg once daily   *If you need a refill on your cardiac medications before your next appointment, please call your pharmacy*   Lab Work: None  If you have labs (blood work) drawn today and your tests are completely normal, you will receive your results only by: Marland Kitchen MyChart Message (if you have MyChart) OR . A paper copy in the mail If you have any lab test that is abnormal or we need to change your treatment, we will call you to review the results.   Testing/Procedures: None   Follow-Up: At [facility], you and your health needs are our priority.  As part of our continuing mission to provide you with exceptional heart care, we have created designated Provider Care Teams.  These Care Teams include your primary Cardiologist (physician) and Advanced Practice Providers (APPs -  Physician Assistants and Nurse Practitioners) who all work together to provide you with the care you need, when you need it.   Your next appointment:   2 month(s)  The format for your next appointment:   In Person  Provider:   Kathlyn Sacramento, MD or Christell Faith, PA-C

## 2020-06-09 DIAGNOSIS — I5033 Acute on chronic diastolic (congestive) heart failure: Secondary | ICD-10-CM | POA: Diagnosis not present

## 2020-06-09 DIAGNOSIS — N179 Acute kidney failure, unspecified: Secondary | ICD-10-CM | POA: Diagnosis not present

## 2020-06-09 DIAGNOSIS — N1832 Chronic kidney disease, stage 3b: Secondary | ICD-10-CM | POA: Diagnosis not present

## 2020-06-09 DIAGNOSIS — I110 Hypertensive heart disease with heart failure: Secondary | ICD-10-CM | POA: Diagnosis not present

## 2020-06-09 DIAGNOSIS — I272 Pulmonary hypertension, unspecified: Secondary | ICD-10-CM | POA: Diagnosis not present

## 2020-06-09 DIAGNOSIS — E1122 Type 2 diabetes mellitus with diabetic chronic kidney disease: Secondary | ICD-10-CM | POA: Diagnosis not present

## 2020-06-14 ENCOUNTER — Telehealth: Payer: Self-pay | Admitting: Physician Assistant

## 2020-06-14 DIAGNOSIS — I110 Hypertensive heart disease with heart failure: Secondary | ICD-10-CM | POA: Diagnosis not present

## 2020-06-14 DIAGNOSIS — N1832 Chronic kidney disease, stage 3b: Secondary | ICD-10-CM | POA: Diagnosis not present

## 2020-06-14 DIAGNOSIS — I5033 Acute on chronic diastolic (congestive) heart failure: Secondary | ICD-10-CM | POA: Diagnosis not present

## 2020-06-14 DIAGNOSIS — E1122 Type 2 diabetes mellitus with diabetic chronic kidney disease: Secondary | ICD-10-CM | POA: Diagnosis not present

## 2020-06-14 DIAGNOSIS — N179 Acute kidney failure, unspecified: Secondary | ICD-10-CM | POA: Diagnosis not present

## 2020-06-14 DIAGNOSIS — I272 Pulmonary hypertension, unspecified: Secondary | ICD-10-CM | POA: Diagnosis not present

## 2020-06-14 NOTE — Telephone Encounter (Signed)
Pt c/o BP issue: STAT if pt c/o blurred vision, one-sided weakness or slurred speech  1. What are your last 5 BP readings? 180/80  2. Are you having any other symptoms (ex. Dizziness, headache, blurred vision, passed out)? none  3. What is your BP issue? High, recent med increase   Dr. Blane Ohara office calling in to make office aware [place] has been calling them with BP concerns. [place] agency is going to be given [facility] information and how to reach out. Amy with [facility] can be reached [phone] to discuss BP issues

## 2020-06-15 DIAGNOSIS — N2581 Secondary hyperparathyroidism of renal origin: Secondary | ICD-10-CM | POA: Diagnosis not present

## 2020-06-15 DIAGNOSIS — E785 Hyperlipidemia, unspecified: Secondary | ICD-10-CM | POA: Diagnosis not present

## 2020-06-15 DIAGNOSIS — E1122 Type 2 diabetes mellitus with diabetic chronic kidney disease: Secondary | ICD-10-CM | POA: Diagnosis not present

## 2020-06-15 DIAGNOSIS — R8290 Unspecified abnormal findings in urine: Secondary | ICD-10-CM | POA: Diagnosis not present

## 2020-06-15 DIAGNOSIS — R609 Edema, unspecified: Secondary | ICD-10-CM | POA: Diagnosis not present

## 2020-06-15 DIAGNOSIS — I509 Heart failure, unspecified: Secondary | ICD-10-CM | POA: Diagnosis not present

## 2020-06-15 DIAGNOSIS — I10 Essential (primary) hypertension: Secondary | ICD-10-CM | POA: Diagnosis not present

## 2020-06-15 DIAGNOSIS — N182 Chronic kidney disease, stage 2 (mild): Secondary | ICD-10-CM | POA: Diagnosis not present

## 2020-06-15 DIAGNOSIS — R809 Proteinuria, unspecified: Secondary | ICD-10-CM | POA: Diagnosis not present

## 2020-06-15 DIAGNOSIS — D631 Anemia in chronic kidney disease: Secondary | ICD-10-CM | POA: Diagnosis not present

## 2020-06-15 DIAGNOSIS — N1832 Chronic kidney disease, stage 3b: Secondary | ICD-10-CM | POA: Diagnosis not present

## 2020-06-16 ENCOUNTER — Other Ambulatory Visit: Payer: Self-pay | Admitting: Physician Assistant

## 2020-06-16 NOTE — Telephone Encounter (Signed)
Please advise if ok to refill medication pt taking differently than what is being prescribed. Pt is taking Furosemide 40 mg qd. Pt requesting 90 day.

## 2020-06-16 NOTE — Telephone Encounter (Signed)
Noted. Patient was doing very well on BID dosed Bystolic (typically once daily dosed medication), though with splitting of doses, she had well controlled BP and felt much better. Elevated BP readings appear to be in the setting of medication nonadherence. Agree with [facility] RN stepping in to assist. Let us know if BP continues to run high. If so, we can change her back to BID dosed Bystolic.

## 2020-06-16 NOTE — Telephone Encounter (Signed)
Spoke with Amy RN with home health regarding patients elevated BP readings and medications. She states patient was not taking as scheduled and that since she discovered this they have her taking them regularly and now blood pressures are doing some better. We also discussed Bystolic and she was once taking twice a day but now they switched to once a day. She states that her blood pressures were much better with the twice a day dosing but now that she has her on a regimen she will monitor her blood pressures and let us know if we need to change anything. She was appreciative for the call back with no further questions at this time.

## 2020-06-16 NOTE — Telephone Encounter (Signed)
Could you clarify which dose she should be taking?

## 2020-06-17 DIAGNOSIS — I509 Heart failure, unspecified: Secondary | ICD-10-CM | POA: Diagnosis not present

## 2020-06-17 DIAGNOSIS — E1142 Type 2 diabetes mellitus with diabetic polyneuropathy: Secondary | ICD-10-CM | POA: Diagnosis not present

## 2020-06-17 DIAGNOSIS — Z0000 Encounter for general adult medical examination without abnormal findings: Secondary | ICD-10-CM | POA: Diagnosis not present

## 2020-06-17 DIAGNOSIS — E785 Hyperlipidemia, unspecified: Secondary | ICD-10-CM | POA: Diagnosis not present

## 2020-06-17 DIAGNOSIS — N189 Chronic kidney disease, unspecified: Secondary | ICD-10-CM | POA: Diagnosis not present

## 2020-06-17 DIAGNOSIS — I130 Hypertensive heart and chronic kidney disease with heart failure and stage 1 through stage 4 chronic kidney disease, or unspecified chronic kidney disease: Secondary | ICD-10-CM | POA: Diagnosis not present

## 2020-06-17 DIAGNOSIS — E1122 Type 2 diabetes mellitus with diabetic chronic kidney disease: Secondary | ICD-10-CM | POA: Diagnosis not present

## 2020-06-20 DIAGNOSIS — N1832 Chronic kidney disease, stage 3b: Secondary | ICD-10-CM | POA: Diagnosis not present

## 2020-06-20 DIAGNOSIS — I272 Pulmonary hypertension, unspecified: Secondary | ICD-10-CM | POA: Diagnosis not present

## 2020-06-20 DIAGNOSIS — N179 Acute kidney failure, unspecified: Secondary | ICD-10-CM | POA: Diagnosis not present

## 2020-06-20 DIAGNOSIS — I110 Hypertensive heart disease with heart failure: Secondary | ICD-10-CM | POA: Diagnosis not present

## 2020-06-20 DIAGNOSIS — E1122 Type 2 diabetes mellitus with diabetic chronic kidney disease: Secondary | ICD-10-CM | POA: Diagnosis not present

## 2020-06-20 DIAGNOSIS — I5033 Acute on chronic diastolic (congestive) heart failure: Secondary | ICD-10-CM | POA: Diagnosis not present

## 2020-06-22 ENCOUNTER — Telehealth: Payer: Self-pay | Admitting: Physician Assistant

## 2020-06-22 DIAGNOSIS — I5032 Chronic diastolic (congestive) heart failure: Secondary | ICD-10-CM

## 2020-06-22 DIAGNOSIS — I272 Pulmonary hypertension, unspecified: Secondary | ICD-10-CM

## 2020-06-22 MED ORDER — BYSTOLIC 20 MG PO TABS: 10.0000 mg | ORAL_TABLET | Freq: Two times a day (BID) | ORAL | 3 refills | Status: DC

## 2020-06-22 NOTE — Telephone Encounter (Signed)
Spoke with Amy RN about orders and advised that I would update chart and send over to rehab for their review of chart to determine if she qualifies for cardiac or pulmonary and if she has not heard back in a couple of weeks to please call our office. She was appreciative for the call back with no further questions at this time. Inquired if I should call patient to review as well and she stated she is going to reach out to the patient with updates and to call us if any further needs.

## 2020-06-22 NOTE — Telephone Encounter (Signed)
Please refill Bystolic as she is taking it:  Bystolic 20 mg 1/2 tab in the morning and 1/2 tab in the afternoon  She would like to enroll in heart track it appears, please see if she qualifies for either cardiac or pulmonary rehab.

## 2020-06-22 NOTE — Telephone Encounter (Signed)
Pt c/o medication issue:  1. Name of Medication: BYSTOLIC 20 MG TABS  2. How are you currently taking this medication (dosage and times per day)? 1/2 tablet in the AM and 1/2 tablet in the PM  3. Are you having a reaction (difficulty breathing--STAT)? No  4. What is your medication issue? Spoke with Amy from [facility], she states that the patients BP is doing much better and she discharged her yesterday. She started cutting this medication in half and is taking 1/2 in the AM and 1/2 in the PM. Patient states that this is working good and would like to continue taking it this way. She would like Christell Faith to start writing the prescriptions for this medication like how she is currently taking it. Please write prescription for "20mg  tablet - 1/2 in the AM and 1/2 in the PM". Please do not write for "10mg  - 2x daily" she states that this is more expensive through her insurance.   Also, patient is interested in getting enrolled in the [facility] program through [facility] and wants to know if Christell Faith thinks this is a good idea for her.   Pt's phone number: [phone]

## 2020-06-24 NOTE — Addendum Note (Signed)
Addended by: Valora Corporal on: 06/24/2020 09:33 AM   Modules accepted: Orders

## 2020-06-24 NOTE — Addendum Note (Signed)
Addended by: Valora Corporal on: 06/24/2020 08:57 AM   Modules accepted: Orders

## 2020-06-24 NOTE — Telephone Encounter (Signed)
Received message from Foster Simpson over at rehab and reply was "She meets criteria for PULMONARY Rehab   Heart Failure and PulmonaryHypertension." Referral needs to be from MD instead of NP or PA. Will place order and forward note to MD for review.

## 2020-06-30 DIAGNOSIS — N1832 Chronic kidney disease, stage 3b: Secondary | ICD-10-CM | POA: Diagnosis not present

## 2020-06-30 DIAGNOSIS — I272 Pulmonary hypertension, unspecified: Secondary | ICD-10-CM | POA: Diagnosis not present

## 2020-06-30 DIAGNOSIS — D631 Anemia in chronic kidney disease: Secondary | ICD-10-CM | POA: Diagnosis not present

## 2020-06-30 DIAGNOSIS — E1122 Type 2 diabetes mellitus with diabetic chronic kidney disease: Secondary | ICD-10-CM | POA: Diagnosis not present

## 2020-06-30 DIAGNOSIS — I5033 Acute on chronic diastolic (congestive) heart failure: Secondary | ICD-10-CM | POA: Diagnosis not present

## 2020-06-30 DIAGNOSIS — I110 Hypertensive heart disease with heart failure: Secondary | ICD-10-CM | POA: Diagnosis not present

## 2020-06-30 DIAGNOSIS — N179 Acute kidney failure, unspecified: Secondary | ICD-10-CM | POA: Diagnosis not present

## 2020-07-06 ENCOUNTER — Ambulatory Visit: Payer: Medicare Other | Attending: Physician Assistant | Admitting: Physical Therapy

## 2020-07-06 DIAGNOSIS — M542 Cervicalgia: Secondary | ICD-10-CM | POA: Diagnosis not present

## 2020-07-06 DIAGNOSIS — M5412 Radiculopathy, cervical region: Secondary | ICD-10-CM
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
## 2020-07-08 DIAGNOSIS — E113313 Type 2 diabetes mellitus with moderate nonproliferative diabetic retinopathy with macular edema, bilateral: Secondary | ICD-10-CM | POA: Diagnosis not present

## 2020-07-13 ENCOUNTER — Encounter: Payer: Medicare Other | Admitting: Physical Therapy

## 2020-07-13 ENCOUNTER — Telehealth: Payer: Self-pay | Admitting: Primary Care

## 2020-07-13 NOTE — Telephone Encounter (Signed)
Rec'd message that patient needed to reschedule the 07/28/20 Palliative f/u visit.  Returned call to patient and visit was rescheduled for 08/22/20 @ 11 AM - per patient's request.

## 2020-07-20 ENCOUNTER — Ambulatory Visit: Payer: Medicare Other | Admitting: Physical Therapy

## 2020-07-21 ENCOUNTER — Other Ambulatory Visit: Payer: Self-pay | Admitting: Physician Assistant

## 2020-07-22 ENCOUNTER — Ambulatory Visit: Payer: Medicare Other | Admitting: Physical Therapy

## 2020-07-22 DIAGNOSIS — E113312 Type 2 diabetes mellitus with moderate nonproliferative diabetic retinopathy with macular edema, left eye: Secondary | ICD-10-CM | POA: Diagnosis not present

## 2020-07-26 ENCOUNTER — Ambulatory Visit: Payer: Medicare Other | Attending: Physician Assistant | Admitting: Physical Therapy

## 2020-07-28 ENCOUNTER — Other Ambulatory Visit: Payer: Medicare Other | Admitting: Primary Care

## 2020-07-28 ENCOUNTER — Encounter: Payer: Medicare Other | Admitting: Physical Therapy

## 2020-08-01 ENCOUNTER — Ambulatory Visit: Payer: Medicare Other | Admitting: Physical Therapy

## 2020-08-03 ENCOUNTER — Encounter: Payer: Medicare Other | Admitting: Physical Therapy

## 2020-08-03 DIAGNOSIS — R8290 Unspecified abnormal findings in urine: Secondary | ICD-10-CM | POA: Diagnosis not present

## 2020-08-03 DIAGNOSIS — D631 Anemia in chronic kidney disease: Secondary | ICD-10-CM | POA: Diagnosis not present

## 2020-08-03 DIAGNOSIS — R609 Edema, unspecified: Secondary | ICD-10-CM | POA: Diagnosis not present

## 2020-08-03 DIAGNOSIS — E7800 Pure hypercholesterolemia, unspecified: Secondary | ICD-10-CM | POA: Diagnosis not present

## 2020-08-03 DIAGNOSIS — N1832 Chronic kidney disease, stage 3b: Secondary | ICD-10-CM | POA: Diagnosis not present

## 2020-08-03 DIAGNOSIS — I509 Heart failure, unspecified: Secondary | ICD-10-CM | POA: Diagnosis not present

## 2020-08-03 DIAGNOSIS — E119 Type 2 diabetes mellitus without complications: Secondary | ICD-10-CM | POA: Diagnosis not present

## 2020-08-03 DIAGNOSIS — I10 Essential (primary) hypertension: Secondary | ICD-10-CM | POA: Diagnosis not present

## 2020-08-03 DIAGNOSIS — R809 Proteinuria, unspecified: Secondary | ICD-10-CM | POA: Diagnosis not present

## 2020-08-09 ENCOUNTER — Encounter: Payer: Medicare Other | Admitting: Physical Therapy

## 2020-08-09 ENCOUNTER — Telehealth: Payer: Self-pay | Admitting: Primary Care

## 2020-08-09 NOTE — Telephone Encounter (Signed)
Attempted to reach patient to reschedule the Palliative f/u visit that was scheduled on 08/22/20, no answer - left message requesting a return call to reschedule visit.
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
## 2020-08-12 ENCOUNTER — Ambulatory Visit (INDEPENDENT_AMBULATORY_CARE_PROVIDER_SITE_OTHER): Payer: Medicare Other | Admitting: Physician Assistant

## 2020-08-12 ENCOUNTER — Encounter: Payer: Self-pay | Admitting: Physician Assistant

## 2020-08-12 ENCOUNTER — Other Ambulatory Visit: Payer: Self-pay

## 2020-08-12 ENCOUNTER — Encounter: Payer: Medicare Other | Admitting: Physical Therapy

## 2020-08-12 VITALS — BP 180/66 | HR 76 | Ht 65.5 in | Wt 157.0 lb

## 2020-08-12 DIAGNOSIS — I272 Pulmonary hypertension, unspecified: Secondary | ICD-10-CM | POA: Diagnosis not present

## 2020-08-12 DIAGNOSIS — I5032 Chronic diastolic (congestive) heart failure: Secondary | ICD-10-CM | POA: Diagnosis not present

## 2020-08-12 DIAGNOSIS — IMO0001 Reserved for inherently not codable concepts without codable children: Secondary | ICD-10-CM

## 2020-08-12 DIAGNOSIS — R778 Other specified abnormalities of plasma proteins: Secondary | ICD-10-CM | POA: Diagnosis not present

## 2020-08-12 DIAGNOSIS — Z789 Other specified health status: Secondary | ICD-10-CM | POA: Diagnosis not present

## 2020-08-12 DIAGNOSIS — I340 Nonrheumatic mitral (valve) insufficiency: Secondary | ICD-10-CM

## 2020-08-12 DIAGNOSIS — N183 Chronic kidney disease, stage 3 unspecified: Secondary | ICD-10-CM | POA: Diagnosis not present

## 2020-08-12 DIAGNOSIS — D638 Anemia in other chronic diseases classified elsewhere: Secondary | ICD-10-CM | POA: Diagnosis not present

## 2020-08-12 MED ORDER — BYSTOLIC 20 MG PO TABS: 20.0000 mg | ORAL_TABLET | Freq: Every day | ORAL | 11 refills | Status: AC

## 2020-08-12 NOTE — Patient Instructions (Signed)
Medication Instructions:  No changes in your medications.  *If you need a refill on your cardiac medications before your next appointment, please call your pharmacy*   Lab Work: None  If you have labs (blood work) drawn today and your tests are completely normal, you will receive your results only by: [place] (if you have MyChart) OR A paper copy in the mail If you have any lab test that is abnormal or we need to change your treatment, we will call you to review the results.   Testing/Procedures: None   Follow-Up: At [facility], you and your health needs are our priority.  As part of our continuing mission to provide you with exceptional heart care, we have created designated Provider Care Teams.  These Care Teams include your primary Cardiologist (physician) and Advanced Practice Providers (APPs -  Physician Assistants and Nurse Practitioners) who all work together to provide you with the care you need, when you need it.   Your next appointment:   6 month(s)  The format for your next appointment:   In Person  Provider:   Kathlyn Sacramento, MD or Christell Faith, PA-C

## 2020-08-15 ENCOUNTER — Other Ambulatory Visit: Payer: Self-pay

## 2020-08-15 ENCOUNTER — Encounter: Payer: Medicare Other | Attending: Cardiovascular Disease | Admitting: *Deleted

## 2020-08-15 DIAGNOSIS — I5032 Chronic diastolic (congestive) heart failure: Secondary | ICD-10-CM

## 2020-08-15 DIAGNOSIS — I272 Pulmonary hypertension, unspecified: Secondary | ICD-10-CM

## 2020-08-15 NOTE — Progress Notes (Signed)
Initial telephone orientation completed. Diagnosis can be found in [facility] 7/29. EP orientation scheduled for 8/11 at [facility].

## 2020-08-18 ENCOUNTER — Telehealth: Payer: Self-pay | Admitting: Primary Care

## 2020-08-19 ENCOUNTER — Telehealth: Payer: Self-pay | Admitting: Primary Care

## 2020-08-19 NOTE — Telephone Encounter (Signed)
Spoke with patient about rescheduling the 8/8 Palliative f/u visit and she said that she wasn't at home and didn't have her calendar with her and she said that I could call her back in the morning to reschedule

## 2020-08-19 NOTE — Telephone Encounter (Signed)
Attempted to reach patient, as requested, to reschedule the 08/22/20 Palliative f/u visit, no answer - left message with my name and call back number requesting return call to schedule.

## 2020-08-22 ENCOUNTER — Other Ambulatory Visit: Payer: Medicare Other | Admitting: Primary Care

## 2020-08-22 ENCOUNTER — Telehealth: Payer: Self-pay | Admitting: Primary Care

## 2020-08-22 NOTE — Telephone Encounter (Signed)
Rec'd return call from patient and she has requested to be discharged from In-home Palliative services at this time.  Patient stated that she was doing fairly well and her MD has enrolled her in a couple of different programs and she feels that this is sufficient for her at this time.  She was in agreement with [place] discharging her from Palliative services.  Have notified PCP and Palliative Team.

## 2020-08-23 DIAGNOSIS — E113313 Type 2 diabetes mellitus with moderate nonproliferative diabetic retinopathy with macular edema, bilateral: Secondary | ICD-10-CM | POA: Diagnosis not present

## 2020-08-23 DIAGNOSIS — E113312 Type 2 diabetes mellitus with moderate nonproliferative diabetic retinopathy with macular edema, left eye: Secondary | ICD-10-CM | POA: Diagnosis not present

## 2020-08-25 ENCOUNTER — Ambulatory Visit: Payer: Medicare Other

## 2020-09-12 DIAGNOSIS — G6289 Other specified polyneuropathies: Secondary | ICD-10-CM | POA: Diagnosis not present

## 2020-09-12 DIAGNOSIS — N182 Chronic kidney disease, stage 2 (mild): Secondary | ICD-10-CM | POA: Diagnosis not present

## 2020-09-12 DIAGNOSIS — E1122 Type 2 diabetes mellitus with diabetic chronic kidney disease: Secondary | ICD-10-CM | POA: Diagnosis not present

## 2020-09-12 DIAGNOSIS — M5412 Radiculopathy, cervical region: Secondary | ICD-10-CM | POA: Diagnosis not present

## 2020-09-12 DIAGNOSIS — N189 Chronic kidney disease, unspecified: Secondary | ICD-10-CM | POA: Diagnosis not present

## 2020-09-12 DIAGNOSIS — M19012 Primary osteoarthritis, left shoulder: Secondary | ICD-10-CM | POA: Diagnosis not present

## 2020-09-12 DIAGNOSIS — M25512 Pain in left shoulder: Secondary | ICD-10-CM | POA: Diagnosis not present

## 2020-09-12 DIAGNOSIS — E785 Hyperlipidemia, unspecified: Secondary | ICD-10-CM | POA: Diagnosis not present

## 2020-09-12 DIAGNOSIS — M542 Cervicalgia: Secondary | ICD-10-CM | POA: Diagnosis not present

## 2020-09-12 DIAGNOSIS — E1169 Type 2 diabetes mellitus with other specified complication: Secondary | ICD-10-CM | POA: Diagnosis not present

## 2020-09-12 DIAGNOSIS — G8929 Other chronic pain: Secondary | ICD-10-CM | POA: Diagnosis not present

## 2020-09-28 DIAGNOSIS — R8290 Unspecified abnormal findings in urine: Secondary | ICD-10-CM | POA: Diagnosis not present

## 2020-09-28 DIAGNOSIS — I509 Heart failure, unspecified: Secondary | ICD-10-CM | POA: Diagnosis not present

## 2020-09-28 DIAGNOSIS — R609 Edema, unspecified: Secondary | ICD-10-CM | POA: Diagnosis not present

## 2020-09-28 DIAGNOSIS — D631 Anemia in chronic kidney disease: Secondary | ICD-10-CM | POA: Diagnosis not present

## 2020-09-28 DIAGNOSIS — R809 Proteinuria, unspecified: Secondary | ICD-10-CM | POA: Diagnosis not present

## 2020-09-28 DIAGNOSIS — E7800 Pure hypercholesterolemia, unspecified: Secondary | ICD-10-CM | POA: Diagnosis not present

## 2020-09-28 DIAGNOSIS — N1832 Chronic kidney disease, stage 3b: Secondary | ICD-10-CM | POA: Diagnosis not present

## 2020-09-28 DIAGNOSIS — I10 Essential (primary) hypertension: Secondary | ICD-10-CM | POA: Diagnosis not present

## 2020-09-28 DIAGNOSIS — E119 Type 2 diabetes mellitus without complications: Secondary | ICD-10-CM | POA: Diagnosis not present

## 2020-10-24 DIAGNOSIS — E559 Vitamin D deficiency, unspecified: Secondary | ICD-10-CM | POA: Diagnosis not present

## 2020-10-24 DIAGNOSIS — R2681 Unsteadiness on feet: Secondary | ICD-10-CM | POA: Diagnosis not present

## 2020-10-24 DIAGNOSIS — E1122 Type 2 diabetes mellitus with diabetic chronic kidney disease: Secondary | ICD-10-CM | POA: Diagnosis not present

## 2020-10-24 DIAGNOSIS — R2689 Other abnormalities of gait and mobility: Secondary | ICD-10-CM | POA: Diagnosis not present

## 2020-10-24 DIAGNOSIS — N182 Chronic kidney disease, stage 2 (mild): Secondary | ICD-10-CM | POA: Diagnosis not present

## 2020-10-24 DIAGNOSIS — R202 Paresthesia of skin: Secondary | ICD-10-CM | POA: Diagnosis not present

## 2020-10-24 DIAGNOSIS — E1142 Type 2 diabetes mellitus with diabetic polyneuropathy: Secondary | ICD-10-CM | POA: Diagnosis not present

## 2020-11-10 DIAGNOSIS — R609 Edema, unspecified: Secondary | ICD-10-CM | POA: Diagnosis not present

## 2020-11-10 DIAGNOSIS — R809 Proteinuria, unspecified: Secondary | ICD-10-CM | POA: Diagnosis not present

## 2020-11-10 DIAGNOSIS — E7800 Pure hypercholesterolemia, unspecified: Secondary | ICD-10-CM | POA: Diagnosis not present

## 2020-11-10 DIAGNOSIS — I10 Essential (primary) hypertension: Secondary | ICD-10-CM | POA: Diagnosis not present

## 2020-11-10 DIAGNOSIS — I509 Heart failure, unspecified: Secondary | ICD-10-CM | POA: Diagnosis not present

## 2020-11-10 DIAGNOSIS — D631 Anemia in chronic kidney disease: Secondary | ICD-10-CM | POA: Diagnosis not present

## 2020-11-10 DIAGNOSIS — E119 Type 2 diabetes mellitus without complications: Secondary | ICD-10-CM | POA: Diagnosis not present

## 2020-11-10 DIAGNOSIS — R8290 Unspecified abnormal findings in urine: Secondary | ICD-10-CM | POA: Diagnosis not present

## 2020-11-10 DIAGNOSIS — N1832 Chronic kidney disease, stage 3b: Secondary | ICD-10-CM | POA: Diagnosis not present

## 2020-11-25 DIAGNOSIS — R202 Paresthesia of skin: Secondary | ICD-10-CM | POA: Diagnosis not present

## 2020-11-30 DIAGNOSIS — R609 Edema, unspecified: Secondary | ICD-10-CM | POA: Diagnosis not present

## 2020-11-30 DIAGNOSIS — E785 Hyperlipidemia, unspecified: Secondary | ICD-10-CM | POA: Diagnosis not present

## 2020-11-30 DIAGNOSIS — I509 Heart failure, unspecified: Secondary | ICD-10-CM | POA: Diagnosis not present

## 2020-11-30 DIAGNOSIS — D631 Anemia in chronic kidney disease: Secondary | ICD-10-CM | POA: Diagnosis not present

## 2020-11-30 DIAGNOSIS — N184 Chronic kidney disease, stage 4 (severe): Secondary | ICD-10-CM | POA: Diagnosis not present

## 2020-11-30 DIAGNOSIS — E119 Type 2 diabetes mellitus without complications: Secondary | ICD-10-CM | POA: Diagnosis not present

## 2020-11-30 DIAGNOSIS — R809 Proteinuria, unspecified: Secondary | ICD-10-CM | POA: Diagnosis not present

## 2020-11-30 DIAGNOSIS — I10 Essential (primary) hypertension: Secondary | ICD-10-CM | POA: Diagnosis not present

## 2020-12-05 ENCOUNTER — Other Ambulatory Visit: Payer: Self-pay | Admitting: Physician Assistant

## 2020-12-12 DIAGNOSIS — E1122 Type 2 diabetes mellitus with diabetic chronic kidney disease: Secondary | ICD-10-CM | POA: Diagnosis not present

## 2020-12-12 DIAGNOSIS — N189 Chronic kidney disease, unspecified: Secondary | ICD-10-CM | POA: Diagnosis not present

## 2020-12-12 DIAGNOSIS — I129 Hypertensive chronic kidney disease with stage 1 through stage 4 chronic kidney disease, or unspecified chronic kidney disease: Secondary | ICD-10-CM | POA: Diagnosis not present

## 2020-12-12 DIAGNOSIS — M5412 Radiculopathy, cervical region: Secondary | ICD-10-CM | POA: Diagnosis not present

## 2020-12-12 DIAGNOSIS — Z87891 Personal history of nicotine dependence: Secondary | ICD-10-CM | POA: Diagnosis not present

## 2020-12-13 DIAGNOSIS — R202 Paresthesia of skin: Secondary | ICD-10-CM | POA: Diagnosis not present

## 2021-01-18 ENCOUNTER — Inpatient Hospital Stay
Admission: EM | Admit: 2021-01-18 | Discharge: 2021-02-15 | DRG: 871 | Disposition: E | Payer: Medicare Other | Attending: Internal Medicine | Admitting: Internal Medicine

## 2021-01-18 ENCOUNTER — Emergency Department: Payer: Medicare Other

## 2021-01-18 ENCOUNTER — Inpatient Hospital Stay (HOSPITAL_COMMUNITY)
Admit: 2021-01-18 | Discharge: 2021-01-18 | Disposition: A | Discharge status: Home / Self Care | Payer: Medicare Other | Attending: Family Medicine | Admitting: Family Medicine

## 2021-01-18 ENCOUNTER — Other Ambulatory Visit: Payer: Self-pay

## 2021-01-18 DIAGNOSIS — I2510 Atherosclerotic heart disease of native coronary artery without angina pectoris: Secondary | ICD-10-CM | POA: Diagnosis present

## 2021-01-18 DIAGNOSIS — N184 Chronic kidney disease, stage 4 (severe): Secondary | ICD-10-CM | POA: Diagnosis present

## 2021-01-18 DIAGNOSIS — Z20822 Contact with and (suspected) exposure to covid-19: Secondary | ICD-10-CM | POA: Diagnosis present

## 2021-01-18 DIAGNOSIS — I130 Hypertensive heart and chronic kidney disease with heart failure and stage 1 through stage 4 chronic kidney disease, or unspecified chronic kidney disease: Secondary | ICD-10-CM | POA: Diagnosis present

## 2021-01-18 DIAGNOSIS — I214 Non-ST elevation (NSTEMI) myocardial infarction: Secondary | ICD-10-CM | POA: Diagnosis not present

## 2021-01-18 DIAGNOSIS — R34 Anuria and oliguria: Secondary | ICD-10-CM | POA: Diagnosis not present

## 2021-01-18 DIAGNOSIS — Z515 Encounter for palliative care: Secondary | ICD-10-CM | POA: Diagnosis not present

## 2021-01-18 DIAGNOSIS — A419 Sepsis, unspecified organism: Principal | ICD-10-CM | POA: Diagnosis present

## 2021-01-18 DIAGNOSIS — I248 Other forms of acute ischemic heart disease: Secondary | ICD-10-CM | POA: Diagnosis present

## 2021-01-18 DIAGNOSIS — E872 Acidosis, unspecified: Secondary | ICD-10-CM | POA: Diagnosis present

## 2021-01-18 DIAGNOSIS — J9601 Acute respiratory failure with hypoxia: Secondary | ICD-10-CM | POA: Diagnosis present

## 2021-01-18 DIAGNOSIS — E8809 Other disorders of plasma-protein metabolism, not elsewhere classified: Secondary | ICD-10-CM | POA: Diagnosis present

## 2021-01-18 DIAGNOSIS — Z531 Procedure and treatment not carried out because of patient's decision for reasons of belief and group pressure: Secondary | ICD-10-CM | POA: Diagnosis present

## 2021-01-18 DIAGNOSIS — N179 Acute kidney failure, unspecified: Secondary | ICD-10-CM | POA: Diagnosis present

## 2021-01-18 DIAGNOSIS — Z7984 Long term (current) use of oral hypoglycemic drugs: Secondary | ICD-10-CM

## 2021-01-18 DIAGNOSIS — I272 Pulmonary hypertension, unspecified: Secondary | ICD-10-CM | POA: Diagnosis present

## 2021-01-18 DIAGNOSIS — J810 Acute pulmonary edema: Secondary | ICD-10-CM | POA: Diagnosis not present

## 2021-01-18 DIAGNOSIS — D631 Anemia in chronic kidney disease: Secondary | ICD-10-CM | POA: Diagnosis present

## 2021-01-18 DIAGNOSIS — R778 Other specified abnormalities of plasma proteins: Secondary | ICD-10-CM | POA: Diagnosis not present

## 2021-01-18 DIAGNOSIS — I5033 Acute on chronic diastolic (congestive) heart failure: Secondary | ICD-10-CM | POA: Diagnosis present

## 2021-01-18 DIAGNOSIS — J9602 Acute respiratory failure with hypercapnia: Secondary | ICD-10-CM | POA: Diagnosis not present

## 2021-01-18 DIAGNOSIS — J9600 Acute respiratory failure, unspecified whether with hypoxia or hypercapnia: Secondary | ICD-10-CM | POA: Diagnosis present

## 2021-01-18 DIAGNOSIS — I509 Heart failure, unspecified: Secondary | ICD-10-CM

## 2021-01-18 DIAGNOSIS — D649 Anemia, unspecified: Secondary | ICD-10-CM | POA: Diagnosis not present

## 2021-01-18 DIAGNOSIS — H5462 Unqualified visual loss, left eye, normal vision right eye: Secondary | ICD-10-CM | POA: Diagnosis present

## 2021-01-18 DIAGNOSIS — N189 Chronic kidney disease, unspecified: Secondary | ICD-10-CM

## 2021-01-18 DIAGNOSIS — E1122 Type 2 diabetes mellitus with diabetic chronic kidney disease: Secondary | ICD-10-CM | POA: Diagnosis present

## 2021-01-18 DIAGNOSIS — J189 Pneumonia, unspecified organism: Secondary | ICD-10-CM | POA: Diagnosis present

## 2021-01-18 DIAGNOSIS — E1165 Type 2 diabetes mellitus with hyperglycemia: Secondary | ICD-10-CM | POA: Diagnosis present

## 2021-01-18 DIAGNOSIS — D6489 Other specified anemias: Secondary | ICD-10-CM | POA: Diagnosis not present

## 2021-01-18 DIAGNOSIS — E785 Hyperlipidemia, unspecified: Secondary | ICD-10-CM | POA: Diagnosis present

## 2021-01-18 DIAGNOSIS — Z66 Do not resuscitate: Secondary | ICD-10-CM | POA: Diagnosis not present

## 2021-01-18 DIAGNOSIS — R296 Repeated falls: Secondary | ICD-10-CM | POA: Diagnosis present

## 2021-01-18 DIAGNOSIS — Z8249 Family history of ischemic heart disease and other diseases of the circulatory system: Secondary | ICD-10-CM

## 2021-01-18 DIAGNOSIS — Z87891 Personal history of nicotine dependence: Secondary | ICD-10-CM

## 2021-01-18 DIAGNOSIS — R6520 Severe sepsis without septic shock: Secondary | ICD-10-CM | POA: Diagnosis present

## 2021-01-18 DIAGNOSIS — D509 Iron deficiency anemia, unspecified: Secondary | ICD-10-CM | POA: Diagnosis present

## 2021-01-18 DIAGNOSIS — Z7189 Other specified counseling: Secondary | ICD-10-CM | POA: Diagnosis not present

## 2021-01-18 DIAGNOSIS — E1151 Type 2 diabetes mellitus with diabetic peripheral angiopathy without gangrene: Secondary | ICD-10-CM | POA: Diagnosis present

## 2021-01-18 DIAGNOSIS — N183 Chronic kidney disease, stage 3 unspecified: Secondary | ICD-10-CM

## 2021-01-18 DIAGNOSIS — I10 Essential (primary) hypertension: Secondary | ICD-10-CM

## 2021-01-18 DIAGNOSIS — Z79899 Other long term (current) drug therapy: Secondary | ICD-10-CM

## 2021-01-18 DIAGNOSIS — R0902 Hypoxemia: Secondary | ICD-10-CM

## 2021-01-18 DIAGNOSIS — Z833 Family history of diabetes mellitus: Secondary | ICD-10-CM

## 2021-01-18 LAB — HEPARIN LEVEL (UNFRACTIONATED)
Heparin Unfractionated: 0.37 IU/mL (ref 0.30–0.70)
Heparin Unfractionated: 0.18 IU/mL — ABNORMAL LOW (ref 0.30–0.70)

## 2021-01-18 LAB — CBC WITH DIFFERENTIAL/PLATELET
Abs Immature Granulocytes: 0.09 10*3/uL — ABNORMAL HIGH (ref 0.00–0.07)
Basophils Absolute: 0.1 10*3/uL (ref 0.0–0.1)
Basophils Relative: 0 %
Eosinophils Absolute: 0.1 10*3/uL (ref 0.0–0.5)
Eosinophils Relative: 1 %
HCT: 29.2 % — ABNORMAL LOW (ref 36.0–46.0)
Hemoglobin: 9.4 g/dL — ABNORMAL LOW (ref 12.0–15.0)
Immature Granulocytes: 1 %
Lymphocytes Relative: 7 %
Lymphs Abs: 1.2 10*3/uL (ref 0.7–4.0)
MCH: 27.6 pg (ref 26.0–34.0)
MCHC: 32.2 g/dL (ref 30.0–36.0)
MCV: 85.6 fL (ref 80.0–100.0)
Monocytes Absolute: 0.8 10*3/uL (ref 0.1–1.0)
Monocytes Relative: 5 %
Neutro Abs: 14.4 10*3/uL — ABNORMAL HIGH (ref 1.7–7.7)
Neutrophils Relative %: 86 %
Platelets: 250 10*3/uL (ref 150–400)
RBC: 3.41 MIL/uL — ABNORMAL LOW (ref 3.87–5.11)
RDW: 16.3 % — ABNORMAL HIGH (ref 11.5–15.5)
WBC: 16.7 10*3/uL — ABNORMAL HIGH (ref 4.0–10.5)
nRBC: 0 % (ref 0.0–0.2)

## 2021-01-18 LAB — BASIC METABOLIC PANEL
Anion gap: 10 (ref 5–15)
BUN: 48 mg/dL — ABNORMAL HIGH (ref 8–23)
CO2: 21 mmol/L — ABNORMAL LOW (ref 22–32)
Calcium: 8.9 mg/dL (ref 8.9–10.3)
Chloride: 105 mmol/L (ref 98–111)
Creatinine, Ser: 2.19 mg/dL — ABNORMAL HIGH (ref 0.44–1.00)
GFR, Estimated: 21 mL/min — ABNORMAL LOW (ref >60)
Glucose, Bld: 276 mg/dL — ABNORMAL HIGH (ref 70–99)
Potassium: 4.3 mmol/L (ref 3.5–5.1)
Sodium: 136 mmol/L (ref 135–145)

## 2021-01-18 LAB — BLOOD GAS, ARTERIAL
Acid-base deficit: 0.8 mmol/L (ref 0.0–2.0)
Bicarbonate: 23.4 mmol/L (ref 20.0–28.0)
Delivery systems: POSITIVE
Expiratory PAP: 5
FIO2: 0.6
Inspiratory PAP: 14
Mechanical Rate: 8
O2 Saturation: 97.9 %
Patient temperature: 37
pCO2 arterial: 36 mmHg (ref 32.0–48.0)
pH, Arterial: 7.42 (ref 7.350–7.450)
pO2, Arterial: 100 mmHg (ref 83.0–108.0)

## 2021-01-18 LAB — TROPONIN I (HIGH SENSITIVITY)
Troponin I (High Sensitivity): 781 ng/L (ref <18)
Troponin I (High Sensitivity): 859 ng/L (ref <18)
Troponin I (High Sensitivity): 937 ng/L (ref <18)
Troponin I (High Sensitivity): 869 ng/L (ref <18)
Troponin I (High Sensitivity): 883 ng/L (ref <18)

## 2021-01-18 LAB — RESP PANEL BY RT-PCR (FLU A&B, COVID) ARPGX2
Influenza A by PCR: NEGATIVE
Influenza B by PCR: NEGATIVE
SARS Coronavirus 2 by RT PCR: NEGATIVE

## 2021-01-18 LAB — CBG MONITORING, ED
Glucose-Capillary: 242 mg/dL — ABNORMAL HIGH (ref 70–99)
Glucose-Capillary: 176 mg/dL — ABNORMAL HIGH (ref 70–99)
Glucose-Capillary: 179 mg/dL — ABNORMAL HIGH (ref 70–99)

## 2021-01-18 LAB — APTT: aPTT: 36 seconds (ref 24–36)

## 2021-01-18 LAB — TSH: TSH: 1.641 u[IU]/mL (ref 0.350–4.500)

## 2021-01-18 LAB — LACTIC ACID, PLASMA: Lactic Acid, Venous: 1.1 mmol/L (ref 0.5–1.9)

## 2021-01-18 LAB — PROTIME-INR
INR: 1.4 — ABNORMAL HIGH (ref 0.8–1.2)
Prothrombin Time: 16.7 seconds — ABNORMAL HIGH (ref 11.4–15.2)

## 2021-01-18 LAB — BRAIN NATRIURETIC PEPTIDE: B Natriuretic Peptide: 2031.2 pg/mL — ABNORMAL HIGH (ref 0.0–100.0)

## 2021-01-18 MED ORDER — HEPARIN BOLUS VIA INFUSION
4000.0000 [IU] | Freq: Once | INTRAVENOUS | Status: AC
  Administered 2021-01-18: 4000 [IU] via INTRAVENOUS
  Filled 2021-01-18: qty 4000

## 2021-01-18 MED ORDER — TRAZODONE HCL 50 MG PO TABS
25.0000 mg | ORAL_TABLET | Freq: Every evening | ORAL | Status: DC | PRN
  Administered 2021-01-18 – 2021-01-19 (×2): 25 mg via ORAL
  Filled 2021-01-18 (×2): qty 1

## 2021-01-18 MED ORDER — AMLODIPINE BESYLATE 5 MG PO TABS: 10.0000 mg | ORAL_TABLET | Freq: Every day | ORAL | Status: DC

## 2021-01-18 MED ORDER — ASPIRIN 81 MG PO CHEW
324.0000 mg | CHEWABLE_TABLET | Freq: Once | ORAL | Status: AC
  Administered 2021-01-18: 324 mg via ORAL

## 2021-01-18 MED ORDER — POTASSIUM CHLORIDE CRYS ER 20 MEQ PO TBCR
10.0000 meq | EXTENDED_RELEASE_TABLET | Freq: Two times a day (BID) | ORAL | Status: DC
  Administered 2021-01-18: 10 meq via ORAL
  Filled 2021-01-18: qty 1

## 2021-01-18 MED ORDER — FUROSEMIDE 10 MG/ML IJ SOLN
40.0000 mg | Freq: Once | INTRAMUSCULAR | Status: AC
  Administered 2021-01-18: 40 mg via INTRAVENOUS

## 2021-01-18 MED ORDER — GLIPIZIDE ER 5 MG PO TB24
5.0000 mg | ORAL_TABLET | Freq: Every day | ORAL | Status: DC
Start: 2021-01-18 — End: 2021-01-18

## 2021-01-18 MED ORDER — MAGNESIUM HYDROXIDE 400 MG/5ML PO SUSP: 30.0000 mL | Freq: Every day | ORAL | Status: DC | PRN

## 2021-01-18 MED ORDER — ASPIRIN EC 81 MG PO TBEC: 81.0000 mg | DELAYED_RELEASE_TABLET | Freq: Every day | ORAL | Status: DC

## 2021-01-18 MED ORDER — FUROSEMIDE 10 MG/ML IJ SOLN
40.0000 mg | Freq: Two times a day (BID) | INTRAMUSCULAR | Status: DC
  Administered 2021-01-18 – 2021-01-19 (×2): 40 mg via INTRAVENOUS
  Filled 2021-01-18 (×2): qty 4

## 2021-01-18 MED ORDER — NEBIVOLOL HCL 10 MG PO TABS
20.0000 mg | ORAL_TABLET | Freq: Every day | ORAL | Status: DC
  Filled 2021-01-18 (×2): qty 2

## 2021-01-18 MED ORDER — INSULIN ASPART 100 UNIT/ML IJ SOLN
0.0000 [IU] | INTRAMUSCULAR | Status: DC
  Administered 2021-01-18 (×2): 2 [IU] via SUBCUTANEOUS
  Administered 2021-01-18: 3 [IU] via SUBCUTANEOUS
  Administered 2021-01-19: 5 [IU] via SUBCUTANEOUS
  Administered 2021-01-19: 2 [IU] via SUBCUTANEOUS
  Administered 2021-01-19: 3 [IU] via SUBCUTANEOUS
  Administered 2021-01-19 – 2021-01-20 (×3): 2 [IU] via SUBCUTANEOUS
  Administered 2021-01-20: 3 [IU] via SUBCUTANEOUS
  Administered 2021-01-20: 2 [IU] via SUBCUTANEOUS
  Filled 2021-01-18 (×11): qty 1

## 2021-01-18 MED ORDER — HYDRALAZINE HCL 50 MG PO TABS
50.0000 mg | ORAL_TABLET | Freq: Three times a day (TID) | ORAL | Status: DC
  Administered 2021-01-18: 50 mg via ORAL
  Filled 2021-01-18: qty 1

## 2021-01-18 MED ORDER — ONDANSETRON HCL 4 MG PO TABS: 4.0000 mg | ORAL_TABLET | Freq: Four times a day (QID) | ORAL | Status: DC | PRN

## 2021-01-18 MED ORDER — HEPARIN (PORCINE) 25000 UT/250ML-% IV SOLN
1000.0000 [IU]/h | INTRAVENOUS | Status: DC
  Administered 2021-01-18: 850 [IU]/h via INTRAVENOUS
  Administered 2021-01-20: 1000 [IU]/h via INTRAVENOUS
  Filled 2021-01-18 (×2): qty 250

## 2021-01-18 MED ORDER — CALCITRIOL 0.25 MCG PO CAPS: 0.2500 ug | ORAL_CAPSULE | Freq: Every day | ORAL | Status: DC

## 2021-01-18 MED ORDER — NEBIVOLOL HCL 10 MG PO TABS: 20.0000 mg | ORAL_TABLET | Freq: Every day | ORAL | Status: DC

## 2021-01-18 MED ORDER — ONDANSETRON HCL 4 MG/2ML IJ SOLN: 4.0000 mg | Freq: Four times a day (QID) | INTRAMUSCULAR | Status: DC | PRN

## 2021-01-18 MED ORDER — ACETAMINOPHEN 325 MG RE SUPP: 650.0000 mg | Freq: Four times a day (QID) | RECTAL | Status: DC | PRN

## 2021-01-18 MED ORDER — ACETAMINOPHEN 325 MG PO TABS: 650.0000 mg | ORAL_TABLET | Freq: Four times a day (QID) | ORAL | Status: DC | PRN

## 2021-01-18 MED ORDER — ATORVASTATIN CALCIUM 20 MG PO TABS: 80.0000 mg | ORAL_TABLET | Freq: Every day | ORAL | Status: DC

## 2021-01-18 MED ORDER — AMLODIPINE BESYLATE 5 MG PO TABS
10.0000 mg | ORAL_TABLET | Freq: Every day | ORAL | Status: DC
  Administered 2021-01-18: 10 mg via ORAL
  Filled 2021-01-18: qty 2

## 2021-01-18 NOTE — ED Triage Notes (Signed)
Pt presents to ER via ems c/o sob that has been going on all day but has become worse tonight.  On ems arrival, pts O2 sats were 79% on RA and came up to 97% on CPAP with ems.  No hx CHF but does take lasix at home which she has not been taking as prescribed.  Pt A&O x4 at this time.

## 2021-01-18 NOTE — Progress Notes (Signed)
Pt placed back on BiPAP due to resp distress

## 2021-01-18 NOTE — ED Notes (Signed)
Lab contacted to check on status of 2nd troponin resulting.

## 2021-01-18 NOTE — Consult Note (Signed)
ANTICOAGULATION CONSULT NOTE - Initial Consult  Pharmacy Consult for heparin infusion Indication: chest pain/ACS  No Known Allergies  Patient Measurements: Weight: 71.2 kg (157 lb) Heparin Dosing Weight: 71.2 kg  Vital Signs: Temp: 97.6 F (36.4 C) (01/04 0509) Temp Source: Oral (01/04 0509) BP: 139/70 (01/04 0430) Pulse Rate: 80 (01/04 0430)  Labs: Recent Labs    01/30/2021 0306  HGB 9.4*  HCT 29.2*  PLT 250  CREATININE 2.19*  TROPONINIHS 781*    Estimated Creatinine Clearance: 17.4 mL/min (A) (by C-G formula based on SCr of 2.19 mg/dL (H)).   Medical History: Past Medical History:  Diagnosis Date   Blind left eye    Cataract    Chronic kidney disease    Diabetes mellitus without complication ([facility])    Hypertension    Retinopathy     Medications:  No prior anticoagulation noted  Assessment: 88 y.o female presented with SOB. Troponin I elevated at 781. Pharmacy has been consulted for heparin infusion  Goal of Therapy:  Heparin level 0.3-0.7 units/ml Monitor platelets by anticoagulation protocol: Yes   Plan:  Give 4000 units bolus x 1 Start heparin infusion at 850 units/hr Check anti-Xa level in 8 hours and daily while on heparin Continue to monitor H&H and platelets  Dorothe Pea, PharmD, BCPS Clinical Pharmacist   02/07/2021,5:19 AM
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
## 2021-01-18 NOTE — Progress Notes (Signed)
Pt placed on HHFNC 45L 80% per MD request
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
## 2021-01-18 NOTE — Consult Note (Signed)
ANTICOAGULATION CONSULT NOTE   Pharmacy Consult for heparin infusion Indication: chest pain/ACS  No Known Allergies  Patient Measurements: Weight: 71.2 kg (157 lb) Heparin Dosing Weight: 71.2 kg  Vital Signs: Temp: 97.6 F (36.4 C) (01/04 0509) Temp Source: Oral (01/04 0509) BP: 164/137 (01/04 1430) Pulse Rate: 77 (01/04 1430)  Labs: Recent Labs    01/24/2021 0306 02/07/2021 0455 01/25/2021 0527 02/10/2021 0908 01/23/2021 1225 01/28/2021 1356  HGB 9.4*  --   --   --   --   --   HCT 29.2*  --   --   --   --   --   PLT 250  --   --   --   --   --   APTT  --  36  --   --   --   --   LABPROT  --  16.7*  --   --   --   --   INR  --  1.4*  --   --   --   --   HEPARINUNFRC  --   --   --   --   --  0.37  CREATININE 2.19*  --   --   --   --   --   TROPONINIHS 781*  --    < > 937* 869* 883*   < > = values in this interval not displayed.     Estimated Creatinine Clearance: 17.4 mL/min (A) (by C-G formula based on SCr of 2.19 mg/dL (H)).   Medical History: Past Medical History:  Diagnosis Date   Blind left eye    Cataract    Chronic kidney disease    Diabetes mellitus without complication (HCC)    Hypertension    Retinopathy     Medications:  No prior anticoagulation noted  Assessment: 88 y.o female presented with SOB. Troponin I elevated at 781. Pharmacy has been consulted for heparin infusion  1/4 1356 HL 0.37  Goal of Therapy:  Heparin level 0.3-0.7 units/ml Monitor platelets by anticoagulation protocol: Yes   Plan:  Heparin level is therapeutic. Will continue heparin infusion at 850 units/hr. Recheck heparin level in 8 hours. CBC daily while on heparin.   Oswald Hillock, PharmD, BCPS Clinical Pharmacist   01/21/2021,2:50 PM
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
## 2021-01-18 NOTE — Progress Notes (Signed)
Sheri Ryan is a 88 y.o. African-American female with medical history significant for type 2 diabetes mellitus, hypertension and CKD with left eye blindness, who presented to the ER with acute onset of worsening respiratory distress with mild lower extremity edema without significant worsening cough or wheezing.  She denied any fever or chills.  No chest pain or palpitations.  No dysuria, oliguria or hematuria or flank pain.  She denied any chest pain.  She was noted to have a pulse oximetry of 79% on room air.  No bleeding diathesis.  Patient admitted for acute on chronic hypoxic respiratory failure requiring BiPAP, suspected secondary to acute on chronic diastolic CHF.  Also admitted for NSTEMI for which she was started on heparin drip and cardiology was consulted.  On presentation BNP greater than 2000, elevated troponin peaked at greater than.  Personally reviewed chest x-ray done on admission which shows cardiomegaly with increasing pulmonary vascularity.  She is on IV Lasix 40 mg twice daily, will continue.  2D echo pending, cardiology will see in consultation.  02/01/2021: Patient was seen and examined at her bedside.  Her son present in the room.  She is on BiPAP and requested to eat.  Contacted RT to wean off BiPAP to heated high flow nasal cannula.  If no plan for procedure will start a diet.  Denies any chest pain at the time of this visit.  Please refer to H&P dictated by my partner Dr. Sidney Ace on 02/03/2021 for further details of the assessment and plan

## 2021-01-18 NOTE — ED Notes (Signed)
Pt placed on external cath.  Tolerating bipap well Alert to verbal stimuli

## 2021-01-19 ENCOUNTER — Inpatient Hospital Stay: Payer: Medicare Other

## 2021-01-19 DIAGNOSIS — J810 Acute pulmonary edema: Secondary | ICD-10-CM | POA: Diagnosis not present

## 2021-01-19 DIAGNOSIS — J9601 Acute respiratory failure with hypoxia: Secondary | ICD-10-CM

## 2021-01-19 DIAGNOSIS — J189 Pneumonia, unspecified organism: Secondary | ICD-10-CM | POA: Diagnosis not present

## 2021-01-19 DIAGNOSIS — N179 Acute kidney failure, unspecified: Secondary | ICD-10-CM | POA: Diagnosis not present

## 2021-01-19 DIAGNOSIS — I509 Heart failure, unspecified: Secondary | ICD-10-CM | POA: Diagnosis not present

## 2021-01-19 DIAGNOSIS — Z515 Encounter for palliative care: Secondary | ICD-10-CM

## 2021-01-19 DIAGNOSIS — I248 Other forms of acute ischemic heart disease: Secondary | ICD-10-CM | POA: Diagnosis not present

## 2021-01-19 DIAGNOSIS — Z7189 Other specified counseling: Secondary | ICD-10-CM | POA: Diagnosis not present

## 2021-01-19 DIAGNOSIS — J9602 Acute respiratory failure with hypercapnia: Secondary | ICD-10-CM

## 2021-01-19 DIAGNOSIS — I272 Pulmonary hypertension, unspecified: Secondary | ICD-10-CM

## 2021-01-19 DIAGNOSIS — D649 Anemia, unspecified: Secondary | ICD-10-CM

## 2021-01-19 LAB — BLOOD GAS, ARTERIAL
Acid-base deficit: 4 mmol/L — ABNORMAL HIGH (ref 0.0–2.0)
Acid-base deficit: 3 mmol/L — ABNORMAL HIGH (ref 0.0–2.0)
Bicarbonate: 20.8 mmol/L (ref 20.0–28.0)
Bicarbonate: 22 mmol/L (ref 20.0–28.0)
Delivery systems: POSITIVE
Expiratory PAP: 6
Expiratory PAP: 9
FIO2: 0.8
FIO2: 1
Inspiratory PAP: 14
Inspiratory PAP: 18
Mechanical Rate: 8
Mechanical Rate: 8
Mode: POSITIVE
O2 Saturation: 76.9 %
O2 Saturation: 84.5 %
Patient temperature: 37
Patient temperature: 37
RATE: 36 resp/min
pCO2 arterial: 36 mmHg (ref 32.0–48.0)
pCO2 arterial: 38 mmHg (ref 32.0–48.0)
pH, Arterial: 7.37 (ref 7.350–7.450)
pH, Arterial: 7.37 (ref 7.350–7.450)
pO2, Arterial: 43 mmHg — ABNORMAL LOW (ref 83.0–108.0)
pO2, Arterial: 51 mmHg — ABNORMAL LOW (ref 83.0–108.0)

## 2021-01-19 LAB — MAGNESIUM: Magnesium: 2.3 mg/dL (ref 1.7–2.4)

## 2021-01-19 LAB — BASIC METABOLIC PANEL
Anion gap: 11 (ref 5–15)
BUN: 58 mg/dL — ABNORMAL HIGH (ref 8–23)
CO2: 22 mmol/L (ref 22–32)
Calcium: 8.7 mg/dL — ABNORMAL LOW (ref 8.9–10.3)
Chloride: 106 mmol/L (ref 98–111)
Creatinine, Ser: 2.48 mg/dL — ABNORMAL HIGH (ref 0.44–1.00)
GFR, Estimated: 18 mL/min — ABNORMAL LOW (ref >60)
Glucose, Bld: 244 mg/dL — ABNORMAL HIGH (ref 70–99)
Potassium: 4.2 mmol/L (ref 3.5–5.1)
Sodium: 139 mmol/L (ref 135–145)

## 2021-01-19 LAB — CBG MONITORING, ED
Glucose-Capillary: 166 mg/dL — ABNORMAL HIGH (ref 70–99)
Glucose-Capillary: 177 mg/dL — ABNORMAL HIGH (ref 70–99)
Glucose-Capillary: 183 mg/dL — ABNORMAL HIGH (ref 70–99)
Glucose-Capillary: 185 mg/dL — ABNORMAL HIGH (ref 70–99)
Glucose-Capillary: 226 mg/dL — ABNORMAL HIGH (ref 70–99)
Glucose-Capillary: 252 mg/dL — ABNORMAL HIGH (ref 70–99)

## 2021-01-19 LAB — CBC
HCT: 25.1 % — ABNORMAL LOW (ref 36.0–46.0)
Hemoglobin: 8 g/dL — ABNORMAL LOW (ref 12.0–15.0)
MCH: 27.2 pg (ref 26.0–34.0)
MCHC: 31.9 g/dL (ref 30.0–36.0)
MCV: 85.4 fL (ref 80.0–100.0)
Platelets: 217 10*3/uL (ref 150–400)
RBC: 2.94 MIL/uL — ABNORMAL LOW (ref 3.87–5.11)
RDW: 16.2 % — ABNORMAL HIGH (ref 11.5–15.5)
WBC: 22.1 10*3/uL — ABNORMAL HIGH (ref 4.0–10.5)
nRBC: 0.2 % (ref 0.0–0.2)

## 2021-01-19 LAB — LIPID PANEL
Cholesterol: 189 mg/dL (ref 0–200)
HDL: 55 mg/dL (ref >40)
LDL Cholesterol: 117 mg/dL — ABNORMAL HIGH (ref 0–99)
Total CHOL/HDL Ratio: 3.4 RATIO
Triglycerides: 84 mg/dL (ref <150)
VLDL: 17 mg/dL (ref 0–40)

## 2021-01-19 LAB — RETIC PANEL
Immature Retic Fract: 22 % — ABNORMAL HIGH (ref 2.3–15.9)
RBC.: 2.89 MIL/uL — ABNORMAL LOW (ref 3.87–5.11)
Retic Count, Absolute: 69.1 10*3/uL (ref 19.0–186.0)
Retic Ct Pct: 2.4 % (ref 0.4–3.1)
Reticulocyte Hemoglobin: 26.7 pg — ABNORMAL LOW (ref >27.9)

## 2021-01-19 LAB — ECHOCARDIOGRAM COMPLETE
Area-P 1/2: 4.31 cm2
S' Lateral: 2.4 cm
Weight: 2512 oz

## 2021-01-19 LAB — HEMOGLOBIN A1C
Hgb A1c MFr Bld: 6 % — ABNORMAL HIGH (ref 4.8–5.6)
Mean Plasma Glucose: 125.5 mg/dL

## 2021-01-19 LAB — PROCALCITONIN: Procalcitonin: 14.78 ng/mL

## 2021-01-19 LAB — IRON AND TIBC
Iron: 14 ug/dL — ABNORMAL LOW (ref 28–170)
Saturation Ratios: 7 % — ABNORMAL LOW (ref 10.4–31.8)
TIBC: 213 ug/dL — ABNORMAL LOW (ref 250–450)
UIBC: 199 ug/dL

## 2021-01-19 LAB — HEPARIN LEVEL (UNFRACTIONATED)
Heparin Unfractionated: 0.55 IU/mL (ref 0.30–0.70)
Heparin Unfractionated: 0.51 IU/mL (ref 0.30–0.70)

## 2021-01-19 LAB — LACTIC ACID, PLASMA
Lactic Acid, Venous: 3 mmol/L (ref 0.5–1.9)
Lactic Acid, Venous: 2.7 mmol/L (ref 0.5–1.9)

## 2021-01-19 LAB — D-DIMER, QUANTITATIVE: D-Dimer, Quant: 1.29 ug/mL-FEU — ABNORMAL HIGH (ref 0.00–0.50)

## 2021-01-19 LAB — FERRITIN: Ferritin: 463 ng/mL — ABNORMAL HIGH (ref 11–307)

## 2021-01-19 LAB — PHOSPHORUS: Phosphorus: 4.6 mg/dL (ref 2.5–4.6)

## 2021-01-19 MED ORDER — SODIUM CHLORIDE 0.9 % IV SOLN: 1.0000 g | INTRAVENOUS | Status: DC

## 2021-01-19 MED ORDER — NEBIVOLOL HCL 2.5 MG PO TABS
2.5000 mg | ORAL_TABLET | Freq: Every day | ORAL | Status: DC
  Filled 2021-01-19 (×2): qty 1

## 2021-01-19 MED ORDER — LORAZEPAM 2 MG/ML IJ SOLN: 0.5000 mg | Freq: Once | INTRAMUSCULAR | Status: DC

## 2021-01-19 MED ORDER — FUROSEMIDE 10 MG/ML IJ SOLN: 20.0000 mg | INTRAMUSCULAR | Status: DC

## 2021-01-19 MED ORDER — FUROSEMIDE 10 MG/ML IJ SOLN
INTRAMUSCULAR | Status: AC
  Administered 2021-01-19: 20 mg via INTRAVENOUS
  Filled 2021-01-19: qty 4

## 2021-01-19 MED ORDER — FUROSEMIDE 10 MG/ML IJ SOLN
8.0000 mg/h | INTRAVENOUS | Status: DC
  Administered 2021-01-19: 4 mg/h via INTRAVENOUS
  Administered 2021-01-20: 8 mg/h via INTRAVENOUS
  Filled 2021-01-19: qty 20

## 2021-01-19 MED ORDER — SODIUM CHLORIDE 0.9 % IV SOLN
500.0000 mg | INTRAVENOUS | Status: DC
  Administered 2021-01-19 – 2021-01-20 (×2): 500 mg via INTRAVENOUS
  Filled 2021-01-19 (×2): qty 5

## 2021-01-19 MED ORDER — FUROSEMIDE 10 MG/ML IJ SOLN: 20.0000 mg | Freq: Three times a day (TID) | INTRAMUSCULAR | Status: DC

## 2021-01-19 MED ORDER — HEPARIN BOLUS VIA INFUSION
2100.0000 [IU] | Freq: Once | INTRAVENOUS | Status: AC
  Administered 2021-01-19: 2100 [IU] via INTRAVENOUS
  Filled 2021-01-19: qty 2100

## 2021-01-19 MED ORDER — SODIUM CHLORIDE 0.9 % IV SOLN
2.0000 g | INTRAVENOUS | Status: DC
  Administered 2021-01-19 – 2021-01-20 (×2): 2 g via INTRAVENOUS
  Filled 2021-01-19 (×2): qty 20

## 2021-01-19 MED ORDER — IPRATROPIUM-ALBUTEROL 0.5-2.5 (3) MG/3ML IN SOLN
3.0000 mL | RESPIRATORY_TRACT | Status: DC | PRN
  Administered 2021-01-19 (×2): 3 mL via RESPIRATORY_TRACT
  Filled 2021-01-19 (×2): qty 3

## 2021-01-19 MED ORDER — HALOPERIDOL LACTATE 5 MG/ML IJ SOLN
1.0000 mg | Freq: Once | INTRAMUSCULAR | Status: AC
  Administered 2021-01-19: 1 mg via INTRAVENOUS
  Filled 2021-01-19: qty 1

## 2021-01-19 NOTE — Progress Notes (Signed)
OT Cancellation Note  Patient Details Name: Sheri Ryan MRN: [id number] DOB: 03/05/1929   Cancelled Treatment:    Reason Eval/Treat Not Completed: Medical issues which prohibited therapy;Other (comment) (pt currently on BiPap, unable to be transitioned to [place] this am per chart review and RN report via secure chat. Will hold at this time, re-attempt when pt is medically ready.)  Shanon Payor, OTD OTR/L  01/19/21, 8:19 AM

## 2021-01-19 NOTE — ED Notes (Signed)
Provider made aware of the patients work of breathing - RT present at the bedside to obtain ABG.

## 2021-01-19 NOTE — ED Notes (Signed)
Phlebotomy contacted about obtaining labs.

## 2021-01-19 NOTE — Consult Note (Signed)
[place] for heparin infusion Indication: chest pain/ACS  No Known Allergies  Patient Measurements: Weight: 71.2 kg (157 lb) Heparin Dosing Weight: 71.2 kg  Vital Signs: BP: 133/68 (01/05 0200) Pulse Rate: 84 (01/05 0200)  Labs: Recent Labs    01/26/2021 0306 01/26/2021 0455 02/12/2021 0527 02/08/2021 0908 01/28/2021 1225 01/31/2021 1356 02/02/2021 2208  HGB 9.4*  --   --   --   --   --   --   HCT 29.2*  --   --   --   --   --   --   PLT 250  --   --   --   --   --   --   APTT  --  36  --   --   --   --   --   LABPROT  --  16.7*  --   --   --   --   --   INR  --  1.4*  --   --   --   --   --   HEPARINUNFRC  --   --   --   --   --  0.37 0.18*  CREATININE 2.19*  --   --   --   --   --   --   TROPONINIHS 781*  --    < > 937* 869* 883*  --    < > = values in this interval not displayed.     Estimated Creatinine Clearance: 17.4 mL/min (A) (by C-G formula based on SCr of 2.19 mg/dL (H)).   Medical History: Past Medical History:  Diagnosis Date   Blind left eye    Cataract    Chronic kidney disease    Diabetes mellitus without complication (HCC)    Hypertension    Retinopathy     Medications:  No prior anticoagulation noted  Assessment: 88 y.o female presented with SOB. Troponin I elevated at 781. Pharmacy has been consulted for heparin infusion  1/4 1356 HL 0.37 1/4 2208 HL 0.18  Goal of Therapy:  Heparin level 0.3-0.7 units/ml Monitor platelets by anticoagulation protocol: Yes   Plan:  Heparin level is SUBtherapeutic. Will given heparin bolus 1200 units x 1. Increase heparin infusion to 1000 units/hr. Recheck heparin level in 8 hours. CBC daily while on heparin.   Dorothe Pea, PharmD, BCPS Clinical Pharmacist   01/19/2021,3:20 AM

## 2021-01-19 NOTE — Consult Note (Signed)
ANTICOAGULATION CONSULT NOTE   Pharmacy Consult for heparin infusion Indication: chest pain/ACS  No Known Allergies  Patient Measurements: Weight: 71.2 kg (157 lb) Heparin Dosing Weight: 71.2 kg  Vital Signs: Temp: 98.9 F (37.2 C) (01/05 0717) Temp Source: Axillary (01/05 0717) BP: 119/64 (01/05 1100) Pulse Rate: 81 (01/05 1100)  Labs: Recent Labs    01/22/2021 0306 01/19/2021 0455 02/11/2021 0527 01/31/2021 0908 01/26/2021 1225 02/08/2021 1356 02/10/2021 2208 01/19/21 0913 01/19/21 1346  HGB 9.4*  --   --   --   --   --   --  8.0*  --   HCT 29.2*  --   --   --   --   --   --  25.1*  --   PLT 250  --   --   --   --   --   --  217  --   APTT  --  36  --   --   --   --   --   --   --   LABPROT  --  16.7*  --   --   --   --   --   --   --   INR  --  1.4*  --   --   --   --   --   --   --   HEPARINUNFRC  --   --   --   --   --  0.37 0.18*  --  0.55  CREATININE 2.19*  --   --   --   --   --   --  2.48*  --   TROPONINIHS 781*  --    < > 937* 869* 883*  --   --   --    < > = values in this interval not displayed.     Estimated Creatinine Clearance: 15.4 mL/min (A) (by C-G formula based on SCr of 2.48 mg/dL (H)).   Medical History: Past Medical History:  Diagnosis Date   Blind left eye    Cataract    Chronic kidney disease    Diabetes mellitus without complication (HCC)    Hypertension    Retinopathy     Medications:  No prior anticoagulation noted  Assessment: 88 y.o female presented with SOB. Troponin I elevated at 781. Pharmacy has been consulted for heparin infusion  1/4 1356 HL 0.37 1/4 2208 HL 0.18 1/5 1346 HL 0.55  Goal of Therapy:  Heparin level 0.3-0.7 units/ml Monitor platelets by anticoagulation protocol: Yes   Plan:  --Heparin level is therapeutic --Continue heparin infusion at 1000 units/hr --Recheck heparin level at 2200 to confirm --CBC daily while on heparin.   Tawnya Crook, PharmD, BCPS Clinical Pharmacist   01/19/2021,3:05 PM

## 2021-01-19 NOTE — ED Notes (Signed)
Lab at bedside to draw labs.

## 2021-01-19 NOTE — TOC Initial Note (Signed)
Transition of Care [facility]) - Initial/Assessment Note    Patient Details  Name: Sheri Ryan MRN: [id number] Date of Birth: [birth year]  [facility] CM/SW Contact:    Sheri Hutching, RN Phone Number: 01/19/2021, 10:44 AM  Clinical Narrative:                 Patient admitted to the hospital with acute respiratory failure currently requiring Bi pap.  RNCM met with patient and patient's son Sheri Ryan at the bedside.  Patient is lethargic and unable to keep her eyes open.  Sheri Ryan is here from [place].  Patient is from home where she lives with her husband and grandson.  Sheri Ryan reports that he has been told she has been falling a lot at home.  She walks with a walker or care.  Husband provides transportation.  She is current with PCP and cardiologist.  She does not wear oxygen at home.   Family would be agreeable to home health services or even short term rehab if needed.    TOC will cont to follow through hospitalization.   Expected Discharge Plan: Skilled Nursing Facility Barriers to Discharge: Continued Medical Work up   Patient Goals and CMS Choice Patient states their goals for this hospitalization and ongoing recovery are:: Patient unable to verbalize on Bipap at this time      Expected Discharge Plan and Services Expected Discharge Plan: [place]   Discharge Planning Services: CM Consult   Living arrangements for the past 2 months: Single Family Home                 DME Arranged: N/A                    Prior Living Arrangements/Services Living arrangements for the past 2 months: Single Family Home Lives with:: Spouse, Relatives (husband and grandson) Patient language and need for interpreter reviewed:: Yes Do you feel safe going back to the place where you live?: Yes      Need for Family Participation in Patient Care: Yes (Comment) Care giver support system in place?: Yes (comment) (husband and son) Current home services: DME (walker and  cane) Criminal Activity/Legal Involvement Pertinent to Current Situation/Hospitalization: No - Comment as needed  Activities of Daily Living      Permission Sought/Granted Permission sought to share information with : Case Manager, Family Supports Permission granted to share information with : Yes, Verbal Permission Granted  Share Information with NAME: Sheri Ryan     Permission granted to share info w Relationship: son  Permission granted to share info w Contact Information: [phone]  Emotional Assessment Appearance:: Appears stated age Attitude/Demeanor/Rapport: Lethargic Affect (typically observed): Unable to Assess   Alcohol / Substance Use: Not Applicable Psych Involvement: No (comment)  Admission diagnosis:  Acute respiratory failure (HCC) [J96.00] Patient Active Problem List   Diagnosis Date Noted   Acute respiratory failure ([place]) 01/17/2021   Heart failure, unspecified ([place]) 06/15/2020   Diabetic ketoacidosis without coma associated with type 2 diabetes mellitus ([place])    Anemia    Acute pulmonary edema (HCC)    Chronic kidney disease (CKD) stage G3b/A1, moderately decreased glomerular filtration rate (GFR) between 30-44 mL/min/1.73 square meter and albuminuria creatinine ratio less than 30 mg/g (HCC)    Respiratory failure ([place]) 03/19/2020   Pain of finger joint 08/07/2019   Gouty arthritis of right great toe 02/24/2018   Blindness of left eye 09/27/2017   Non-toxic multinodular goiter 09/27/2017  Left shoulder pain 04/30/2017   Neck pain 04/30/2017   Cervical radiculopathy at C6 04/23/2017   Cervical disc disorder at C5-C6 level with radiculopathy 04/15/2017   Type II diabetes mellitus with complication ([place]) 57/01/7791   Impaired functional mobility, balance, gait, and endurance 90/30/0923   Hypertrophic lichen planus 30/07/6224   Hyperlipidemia associated with type 2 diabetes mellitus ([place]) 06/27/2014   Essential (primary) hypertension 06/27/2014    Acid reflux 06/27/2014   Arthritis of knee, degenerative 06/27/2014   Hyperlipidemia 10/12/2011   PCP:  Sheri Ramsay, MD Pharmacy:   [facility] [phone] [place] [address] Phone: [phone] Fax: [phone]     Social Determinants of Health ([place]) Interventions    Readmission Risk Interventions Readmission Risk Prevention Plan 01/19/2021  Transportation Screening Complete  PCP or Specialist Appt within 5-7 Days Complete  Home Care Screening Complete  Medication Review (RN CM) Complete  Some recent data might be hidden

## 2021-01-19 NOTE — Progress Notes (Signed)
[place], [place] 01/19/21  Subjective:   Hospital day # 1 Patient known to our practice from outpatient follow-up.  She is followed by Dr. Lanora Manis She has medical problems of hypertension, coronary artery disease, diabetes, congestive heart failure, peripheral vascular disease, anemia, proteinuria and chronic kidney disease stage IV. Baseline creatinine of 2.2/GFR 25 from December 12, 2020 Admission creatinine of 2.2/GFR 29 from January 4 Today, when seen, patient is on BiPAP All information is obtained from the chart and partially from the patient. Also on IV heparin infusion  Renal: No intake/output data recorded. Lab Results  Component Value Date   CREATININE 2.19 (H) 02/09/2021   CREATININE 1.46 (H) 04/20/2020   CREATININE 2.83 (H) 03/30/2020     Objective:  Vital signs in last 24 hours:  Temp:  [98.9 F (37.2 C)] 98.9 F (37.2 C) (01/05 0717) Pulse Rate:  [73-96] 85 (01/05 0755) Resp:  [16-38] 38 (01/05 0717) BP: (115-167)/(50-137) 115/50 (01/05 0717) SpO2:  [87 %-99 %] 93 % (01/05 0755) FiO2 (%):  [80 %] 80 % (01/05 0232)  Weight change:  Filed Weights   02/04/2021 0509  Weight: 71.2 kg    Intake/Output:   No intake or output data in the 24 hours ending 01/19/21 0928   Physical Exam: General: Ill-appearing, laying in the bed  HEENT NIPPV mask in place  Pulm/lungs Bilateral diffuse crackles  CVS/Heart Regular rhythm, NSR  Abdomen:  Soft, nontender  Extremities: 1+ edema bilaterally  Neurologic: Alert, oriented, able to answer questions  Skin: Warm, dry          Basic Metabolic Panel:  Recent Labs  Lab 01/16/2021 0306  NA 136  K 4.3  CL 105  CO2 21*  GLUCOSE 276*  BUN 48*  CREATININE 2.19*  CALCIUM 8.9     CBC: Recent Labs  Lab 02/07/2021 0306 01/19/21 0913  WBC 16.7* 22.1*  NEUTROABS 14.4*  --   HGB 9.4* 8.0*  HCT 29.2* 25.1*  MCV 85.6 85.4  PLT 250 217     No results found for: HEPBSAG, HEPBSAB,  HEPBIGM    Microbiology:  Recent Results (from the past 240 hour(s))  Resp Panel by RT-PCR (Flu A&B, Covid) Nasopharyngeal Swab     Status: None   Collection Time: 02/14/2021  3:06 AM   Specimen: Nasopharyngeal Swab; Nasopharyngeal(NP) swabs in vial transport medium  Result Value Ref Range Status   SARS Coronavirus 2 by RT PCR NEGATIVE NEGATIVE Final    Comment: (NOTE) SARS-CoV-2 target nucleic acids are NOT DETECTED.  The SARS-CoV-2 RNA is generally detectable in upper respiratory specimens during the acute phase of infection. The lowest concentration of SARS-CoV-2 viral copies this assay can detect is 138 copies/mL. A negative result does not preclude SARS-Cov-2 infection and should not be used as the sole basis for treatment or other patient management decisions. A negative result may occur with  improper specimen collection/handling, submission of specimen other than nasopharyngeal swab, presence of viral mutation(s) within the areas targeted by this assay, and inadequate number of viral copies(<138 copies/mL). A negative result must be combined with clinical observations, patient history, and epidemiological information. The expected result is Negative.  Fact Sheet for Patients:  EntrepreneurPulse.com.au  Fact Sheet for Healthcare Providers:  IncredibleEmployment.be  This test is no t yet approved or cleared by the [place] FDA and  has been authorized for detection and/or diagnosis of SARS-CoV-2 by FDA under an Emergency Use Authorization (EUA). This EUA will remain  in  effect (meaning this test can be used) for the duration of the COVID-19 declaration under Section 564(b)(1) of the Act, 21 U.S.C.section 360bbb-3(b)(1), unless the authorization is terminated  or revoked sooner.       Influenza A by PCR NEGATIVE NEGATIVE Final   Influenza B by PCR NEGATIVE NEGATIVE Final    Comment: (NOTE) The Xpert Xpress SARS-CoV-2/FLU/RSV  plus assay is intended as an aid in the diagnosis of influenza from Nasopharyngeal swab specimens and should not be used as a sole basis for treatment. Nasal washings and aspirates are unacceptable for Xpert Xpress SARS-CoV-2/FLU/RSV testing.  Fact Sheet for Patients: EntrepreneurPulse.com.au  Fact Sheet for Healthcare Providers: IncredibleEmployment.be  This test is not yet approved or cleared by the [place] FDA and has been authorized for detection and/or diagnosis of SARS-CoV-2 by FDA under an Emergency Use Authorization (EUA). This EUA will remain in effect (meaning this test can be used) for the duration of the COVID-19 declaration under Section 564(b)(1) of the Act, 21 U.S.C. section 360bbb-3(b)(1), unless the authorization is terminated or revoked.  Performed at [facility], [address] [place], [place] [address]     Coagulation Studies: Recent Labs    02/10/2021 0455  LABPROT 16.7*  INR 1.4*    Urinalysis: No results for input(s): COLORURINE, LABSPEC, PHURINE, GLUCOSEU, HGBUR, BILIRUBINUR, KETONESUR, PROTEINUR, UROBILINOGEN, NITRITE, LEUKOCYTESUR in the last 72 hours.  Invalid input(s): APPERANCEUR    Imaging: DG Chest Port 1 View  Result Date: 01/19/2021 CLINICAL DATA:  Shortness of breath EXAM: PORTABLE CHEST 1 VIEW COMPARISON:  Previous studies including the examination of 01/23/2021 FINDINGS: Transverse diameter of heart is increased. Central pulmonary vessels are more prominent. Diffuse interstitial and alveolar densities seen in both lungs. There is blunting of both lateral CP angles. There is no pneumothorax. IMPRESSION: Cardiomegaly. There is interval worsening of pulmonary vascular congestion and pulmonary edema. Possibility of underlying pneumonia is not excluded. Small bilateral pleural effusions. Electronically Signed   By: Elmer Picker M.D.   On: 01/19/2021 08:27   DG Chest Portable 1  View  Result Date: 01/22/2021 CLINICAL DATA:  88 year old female with shortness of breath, hypoxia. EXAM: PORTABLE CHEST 1 VIEW COMPARISON:  Portable chest 03/23/2020 and earlier. FINDINGS: Portable AP upright view at 0350 hours. There is cardiomegaly. Other mediastinal contours are within normal limits. Stable lung volumes. Diffuse increased pulmonary interstitial opacity with basilar predominance and additional vague bibasilar veiling opacity. No pneumothorax or air bronchograms. Visualized tracheal air column is within normal limits. Calcified aortic atherosclerosis. Negative visible bowel gas. No acute osseous abnormality identified. IMPRESSION: Recurrent acute pulmonary edema. Possible small pleural effusions. Underlying cardiomegaly. Electronically Signed   By: Genevie Ann M.D.   On: 01/28/2021 04:26     Medications:    heparin 1,000 Units/hr (01/19/21 0718)    aspirin EC  81 mg Oral Daily   atorvastatin  80 mg Oral Daily   furosemide  20 mg Intravenous Q4H   furosemide  20 mg Intravenous STAT   insulin aspart  0-9 Units Subcutaneous Q4H   nebivolol  2.5 mg Oral Daily   potassium chloride  10 mEq Oral BID   acetaminophen **OR** acetaminophen, ipratropium-albuterol, magnesium hydroxide, ondansetron **OR** ondansetron (ZOFRAN) IV, traZODone  Assessment/ Plan:  88 y.o. female with diabetes type 2, hypertension, left eye blindness,Coronary artery disease, congestive heart failure, peripheral vascular disease, anemia, proteinuria and CKD   admitted on 01/28/2021 for Acute respiratory failure ([place]) [J96.00]  #Chronic kidney disease stage IV with diabetic  nephropathy CKD likely secondary to diabetes, hypertension, atherosclerosis and advanced age Baseline creatinine of 2.2, GFR 25 from December 12, 2020 Patient's creatinine is at baseline Today's labs are pending.  We will monitor closely  #Acute respiratory distress/failure secondary to acute pulmonary edema 2D echo from March 2022 shows LVEF  55 to 60%, mild LVH, indeterminate ventricular diastolic parameters, moderate pulmonary hypertension, moderate mitral regurgitation  2D echo done on Jan 18, 2021, - results pending SARS CoV-2, influ A and B are neg CXR 01/19/21- cardiomegaly with worsening vascular congestion and pulm edema. Underlying pneumonia not excluded, Patient has received iv lasix small dose of 20 mg iv boluses with uncertain effect and no significant clinical improvement. Will change to iv lasix infusion @ 4 mg/hr and monitor     LOS: [place] 1/5/20239:28 AM  [place], [place]  Note: This note was prepared with Dragon dictation. Any transcription errors are unintentional

## 2021-01-19 NOTE — Progress Notes (Signed)
Progress Note  Patient Name: Sheri Ryan Date of Encounter: 01/19/2021  Primary Cardiologist: Fletcher Anon  Subjective   No chest pain. Remains on BiPAP. Documented 300 mL for the admission. No weights available for review.   Inpatient Medications    Scheduled Meds:  aspirin EC  81 mg Oral Daily   atorvastatin  80 mg Oral Daily   insulin aspart  0-9 Units Subcutaneous Q4H   nebivolol  2.5 mg Oral Daily   potassium chloride  10 mEq Oral BID   Continuous Infusions:  furosemide (LASIX) 200 mg in dextrose 5% 100 mL (2mg /mL) infusion     heparin 1,000 Units/hr (01/19/21 0718)   PRN Meds: acetaminophen **OR** acetaminophen, ipratropium-albuterol, magnesium hydroxide, ondansetron **OR** ondansetron (ZOFRAN) IV, traZODone   Vital Signs    Vitals:   01/19/21 0500 01/19/21 0600 01/19/21 0717 01/19/21 0755  BP: (!) 143/90 (!) 128/55 (!) 115/50   Pulse: 96 91 90 85  Resp: (!) 33 (!) 25 (!) 38   Temp:   98.9 F (37.2 C)   TempSrc:   Axillary   SpO2: 96% 98% 94% 93%  Weight:       No intake or output data in the 24 hours ending 01/19/21 0948 Filed Weights   01/16/2021 0509  Weight: 71.2 kg    Telemetry    SR - Personally Reviewed  ECG    No new tracings - Personally Reviewed  Physical Exam   GEN: No acute distress. BiPAP in place.  Neck: JVD unable to be assessed secondary to respiratory support apparatus. Cardiac: RRR, no murmurs, rubs, or gallops.  Respiratory: Diminished breath sounds bilaterally with diffuse rhonchi L>R.  GI: Soft, nontender, non-distended.   MS: No edema; No deformity. Neuro:  Alert and oriented x 3; Nonfocal.  Psych: Normal affect.  Labs    Chemistry Recent Labs  Lab 01/15/2021 0306  NA 136  K 4.3  CL 105  CO2 21*  GLUCOSE 276*  BUN 48*  CREATININE 2.19*  CALCIUM 8.9  GFRNONAA 21*  ANIONGAP 10     Hematology Recent Labs  Lab 01/22/2021 0306 01/19/21 0913  WBC 16.7* 22.1*  RBC 3.41* 2.94*  HGB 9.4* 8.0*  HCT 29.2*  25.1*  MCV 85.6 85.4  MCH 27.6 27.2  MCHC 32.2 31.9  RDW 16.3* 16.2*  PLT 250 217    Cardiac EnzymesNo results for input(s): TROPONINI in the last 168 hours. No results for input(s): TROPIPOC in the last 168 hours.   BNP Recent Labs  Lab 02/04/2021 0306  BNP 2,031.2*     DDimer No results for input(s): DDIMER in the last 168 hours.   Radiology    DG Chest Port 1 View  Result Date: 01/19/2021 IMPRESSION: Cardiomegaly. There is interval worsening of pulmonary vascular congestion and pulmonary edema. Possibility of underlying pneumonia is not excluded. Small bilateral pleural effusions. Electronically Signed   By: Elmer Picker M.D.   On: 01/19/2021 08:27   DG Chest Portable 1 View  Result Date: 01/15/2021 IMPRESSION: Recurrent acute pulmonary edema. Possible small pleural effusions. Underlying cardiomegaly. Electronically Signed   By: Genevie Ann M.D.   On: 02/10/2021 04:26    Cardiac Studies   2D echo 02/02/2021:  1. Left ventricular ejection fraction, by estimation, is 55 to 60%. The  left ventricle has normal function. The left ventricle has no regional  wall motion abnormalities. There is mild left ventricular hypertrophy.  Left ventricular diastolic parameters  are consistent with Grade II diastolic  dysfunction (pseudonormalization).   2. Right ventricular systolic function is normal. The right ventricular  size is normal. There is moderately elevated pulmonary artery systolic  pressure. The estimated right ventricular systolic pressure is 81.8 mmHg.   3. The mitral valve is normal in structure. Mild to moderate mitral valve  regurgitation. No evidence of mitral stenosis.   4. The aortic valve was not well visualized. Aortic valve regurgitation  is not visualized. Aortic valve sclerosis/calcification is present,  without any evidence of aortic stenosis.   5. The inferior vena cava is normal in size with greater than 50%  respiratory variability, suggesting right atrial  pressure of 3 mmHg.  __________  2D echo 03/20/2020: 1. Left ventricular ejection fraction, by estimation, is 55 to 60%. The  left ventricle has normal function. Left ventricular endocardial border  not optimally defined to evaluate regional wall motion. There is mild left  ventricular hypertrophy. Left  ventricular diastolic parameters are indeterminate.   2. Right ventricular systolic function is normal. The right ventricular  size is normal. There is moderately elevated pulmonary artery systolic  pressure. The estimated right ventricular systolic pressure is 29.9 mmHg.   3. Left atrial size was mildly dilated.   4. The mitral valve is normal in structure. Moderate mitral valve  regurgitation. No evidence of mitral stenosis.   5. The aortic valve is normal in structure. Aortic valve regurgitation is  not visualized. Mild to moderate aortic valve sclerosis/calcification is  present, without any evidence of aortic stenosis.  Patient Profile     88 y.o. female with history of HFpEF, pulmonary hypertension, CKD stage III, DM, HTN, HLD, anemia, and cervical radiculopathy who is a Sales promotion account executive Witness who we are seeing for acute on chronic HFpEF and elevated high sensitivity troponin.  Assessment & Plan    1. Acute hypoxic respiratory failure: -Likely multifactorial including volume overload, pulmonary hypertension and possible PNA -Remains on BiPAP, wean as able -WBC trending up to 22,000 -PCT 14.78 -CXR with volume overload, unable to exclude PNA   2. Acute on chronic HFpEF/pulmonary hypertension: -Volume up on exam and by echo -Likely exacerbated by underlying anemia and possible pulmonary infection  -BNP 2031 -She has been change to a Lasix gtt per nephrology  -Recommend strict ins and outs along with standing scale daily weights  2. Elevated high sensitivity troponin: -No chest pain -Likely supply demand mismatch in the setting of acute on chronic HFpEF, anemia, underlying CKD,  and possible infection -Mildly elevated, peaking at 937, now down trending -Echo as above -Favor avoiding invasive cardiac work up unless absolutely necessary, given she is at high risk for complications in the setting of her advanced age, CKD, and anemia with refusal of blood products  -Complete 48-hours of IV heparin (at 0545 on 2021/02/16) -Defer addition of P2Y12 inhibitor at this time given anemia  3. Anemia of chronic disease complicated by patient refusal for blood products: -HGB low, though stable at 8.0 -Monitor   4. CKD stge III: -Nephrology following and assisting with diuresis   5. Possible PNA/leukocytosis: -Per primary service      For questions or updates, please contact [place] Please consult [email or web address] for contact info under Cardiology/STEMI.    Signed, Christell Faith, PA-C [place] Pager: [phone] 01/19/2021, 9:48 AM

## 2021-01-19 NOTE — Progress Notes (Addendum)
PROGRESS NOTE  Sheri Ryan CLE:[id number] DOB: April 04, 1929 DOA: 02/07/2021 PCP: Leonel Ramsay, MD  HPI/Recap of past 24 hours: Sheri Ryan is a 88 y.o. African-American female with medical history significant for type 2 diabetes mellitus, hypertension and CKD4 with left eye blindness, Jehova's witness, cervical radiculopathy, who presented to [facility] ED with acute onset of worsening respiratory distress.  Associated with mild lower extremity edema.  Work-up revealed acute on chronic hypoxic respiratory failure requiring BiPAP, likely secondary to acute on chronic diastolic CHF, pulmonary edema with BNP greater than 2000.  Also concern for NSTEMI.  Troponin S peaking at greater than 8000, for which she was started on heparin drip and cardiology was consulted.   01/19/2021: Patient was seen and examined at her bedside in the ED.  Increased work of breathing while on BiPAP.  Stat chest x-ray and stat ABG 7.3 7/36/43 with FiO2 of 80% on BiPAP.  PCCM consulted to assist with the management.  Chest x-ray could not rule out pneumonia, Rocephin started per PCCM.  Obtain sputum culture if able.  Assessment/Plan: Principal Problem:   Acute respiratory failure (HCC)  Severe acute on chronic hypoxic respiratory failure secondary to acute pulmonary edema superimposed by community-acquired pneumonia, POA Patient had a repeated chest x-ray done on 01/19/2021, could not exclude pneumonia Started on Rocephin on 01/19/2021 Obtain sputum culture if able At baseline she is on oxygen supplementation, her son is unaware of exactly how many liters she is on. BiPAP nightly, during the day heated high flow nasal cannula to maintain O2 saturation above 90%. ABG done this morning results as stated above. PCCM consulted to assist with the management.  Elevated troponin vs NSTEMI, suspect demand ischemia in the setting of severe hypoxemia Presented with elevated troponin, peaked at 937 and down  trended. 2D echo ordered, results are pending Seen by cardiology, recommended 48 hours heparin drip, started 01/30/2021. Started daily aspirin  Sepsis secondary to community-acquired pneumonia, POA Seen on chest x-ray Obtain blood cultures x2 peripherally Follow blood cultures and sputum culture to narrow down antibiotics Started on Rocephin by PCCM  Acute on chronic diastolic CHF As evidenced by pulmonary edema seen on chest x-ray, right JVD, elevated BNP Received IV diuresing Cardiology consulted Continue strict I's and O's and daily weight Follow 2D echo Management per cardiology  AKI on CKD 4 Baseline creatinine appears to be 2.1 with GFR of 21. Creatinine rising 2.4 with GFR of 18. Continue to avoid nephrotoxic agents and hypotension. Holding off IV fluid due to pulmonary edema causing acute respiratory failure. Closely monitor urine output Nephrology consulted and following.  Anemia of chronic disease Hemoglobin downtrending 8.0 from 9.4 No overt bleeding History of iron deficiency anemia Replete iron orally when no longer n.p.o.  Type 2 diabetes with hyperglycemia Obtain hemoglobin A1c, 6.1 on 01/19/21, within prediabetes range. Continue judicious insulin sliding scale and avoid hypoglycemia  Essential hypertension, BPs are currently soft Holding off most of her home oral antihypertensive Continue nebivolol to avoid beta-blockade withdrawal. Continue to closely monitor vital signs  Physical debility PT OT to assess once stable Fall precautions.  Goals of care Palliative care team consulted to assist with establishing goals of care Currently DNI   Critical care time: 65 minutes.   Code Status: DO NOT INTUBATE  Family Communication: Updated son at bedside on 01/27/2021.  Disposition Plan: Likely will discharge to home, possibly with home health services.   Consultants: Cardiology Nephrology  Procedures: 2D echo  Antimicrobials: Rocephin  DVT  prophylaxis: Heparin drip  Status is: Inpatient  Inpatient status.  Patient requires at least 2 midnights for further evaluation and treatment of present condition      Objective: Vitals:   01/19/21 0717 01/19/21 0755 01/19/21 1000 01/19/21 1100  BP: (!) 115/50  115/60 119/64  Pulse: 90 85 78 81  Resp: (!) 38  (!) 30 16  Temp: 98.9 F (37.2 C)     TempSrc: Axillary     SpO2: 94% 93% 99% 96%  Weight:        Intake/Output Summary (Last 24 hours) at 01/19/2021 1200 Last data filed at 01/19/2021 1119 Gross per 24 hour  Intake --  Output 300 ml  Net -300 ml   Filed Weights   01/17/2021 0509  Weight: 71.2 kg    Exam:  General: 88 y.o. year-old female well developed well nourished in no acute distress.  Alert and oriented x3. Cardiovascular: Regular rate and rhythm with no rubs or gallops.  No thyromegaly.  Right JVD noted.   Respiratory: Diffuse rales bilaterally.  Poor inspiratory effort. Abdomen: Soft nontender nondistended with normal bowel sounds x4 quadrants. Musculoskeletal: Trace lower extremity edema. 2/4 pulses in all 4 extremities. Skin: No ulcerative lesions noted or rashes, Psychiatry: Mood is appropriate for condition and setting Neuro: Moves all 4 extremities equally.   Data Reviewed: CBC: Recent Labs  Lab 02/14/2021 0306 01/19/21 0913  WBC 16.7* 22.1*  NEUTROABS 14.4*  --   HGB 9.4* 8.0*  HCT 29.2* 25.1*  MCV 85.6 85.4  PLT 250 546   Basic Metabolic Panel: Recent Labs  Lab 02/12/2021 0306 01/19/21 0913  NA 136 139  K 4.3 4.2  CL 105 106  CO2 21* 22  GLUCOSE 276* 244*  BUN 48* 58*  CREATININE 2.19* 2.48*  CALCIUM 8.9 8.7*  MG  --  2.3  PHOS  --  4.6   GFR: Estimated Creatinine Clearance: 15.4 mL/min (A) (by C-G formula based on SCr of 2.48 mg/dL (H)). Liver Function Tests: No results for input(s): AST, ALT, ALKPHOS, BILITOT, PROT, ALBUMIN in the last 168 hours. No results for input(s): LIPASE, AMYLASE in the last 168 hours. No results  for input(s): AMMONIA in the last 168 hours. Coagulation Profile: Recent Labs  Lab 02/13/2021 0455  INR 1.4*   Cardiac Enzymes: No results for input(s): CKTOTAL, CKMB, CKMBINDEX, TROPONINI in the last 168 hours. BNP (last 3 results) No results for input(s): PROBNP in the last 8760 hours. HbA1C: No results for input(s): HGBA1C in the last 72 hours. CBG: Recent Labs  Lab 02/08/2021 2206 01/19/21 0000 01/19/21 0352 01/19/21 0720 01/19/21 1127  GLUCAP 179* 166* 185* 226* 252*   Lipid Profile: Recent Labs    01/19/21 0913  CHOL 189  HDL 55  LDLCALC 117*  TRIG 84  CHOLHDL 3.4   Thyroid Function Tests: Recent Labs    02/05/2021 0527  TSH 1.641   Anemia Panel: No results for input(s): VITAMINB12, FOLATE, FERRITIN, TIBC, IRON, RETICCTPCT in the last 72 hours. Urine analysis:    Component Value Date/Time   COLORURINE YELLOW (A) 03/23/2020 1530   APPEARANCEUR CLOUDY (A) 03/23/2020 1530   LABSPEC 1.008 03/23/2020 1530   PHURINE 5.0 03/23/2020 1530   GLUCOSEU NEGATIVE 03/23/2020 1530   HGBUR MODERATE (A) 03/23/2020 1530   BILIRUBINUR NEGATIVE 03/23/2020 [place] 03/23/2020 1530   PROTEINUR 100 (A) 03/23/2020 1530   NITRITE NEGATIVE 03/23/2020 1530   LEUKOCYTESUR LARGE (A) 03/23/2020 1530   Sepsis  Labs: @LABRCNTIP (procalcitonin:4,lacticidven:4)  ) Recent Results (from the past 240 hour(s))  Resp Panel by RT-PCR (Flu A&B, Covid) Nasopharyngeal Swab     Status: None   Collection Time: 02/13/2021  3:06 AM   Specimen: Nasopharyngeal Swab; Nasopharyngeal(NP) swabs in vial transport medium  Result Value Ref Range Status   SARS Coronavirus 2 by RT PCR NEGATIVE NEGATIVE Final    Comment: (NOTE) SARS-CoV-2 target nucleic acids are NOT DETECTED.  The SARS-CoV-2 RNA is generally detectable in upper respiratory specimens during the acute phase of infection. The lowest concentration of SARS-CoV-2 viral copies this assay can detect is 138 copies/mL. A negative  result does not preclude SARS-Cov-2 infection and should not be used as the sole basis for treatment or other patient management decisions. A negative result may occur with  improper specimen collection/handling, submission of specimen other than nasopharyngeal swab, presence of viral mutation(s) within the areas targeted by this assay, and inadequate number of viral copies(<138 copies/mL). A negative result must be combined with clinical observations, patient history, and epidemiological information. The expected result is Negative.  Fact Sheet for Patients:  EntrepreneurPulse.com.au  Fact Sheet for Healthcare Providers:  IncredibleEmployment.be  This test is no t yet approved or cleared by the [place] FDA and  has been authorized for detection and/or diagnosis of SARS-CoV-2 by FDA under an Emergency Use Authorization (EUA). This EUA will remain  in effect (meaning this test can be used) for the duration of the COVID-19 declaration under Section 564(b)(1) of the Act, 21 U.S.C.section 360bbb-3(b)(1), unless the authorization is terminated  or revoked sooner.       Influenza A by PCR NEGATIVE NEGATIVE Final   Influenza B by PCR NEGATIVE NEGATIVE Final    Comment: (NOTE) The Xpert Xpress SARS-CoV-2/FLU/RSV plus assay is intended as an aid in the diagnosis of influenza from Nasopharyngeal swab specimens and should not be used as a sole basis for treatment. Nasal washings and aspirates are unacceptable for Xpert Xpress SARS-CoV-2/FLU/RSV testing.  Fact Sheet for Patients: EntrepreneurPulse.com.au  Fact Sheet for Healthcare Providers: IncredibleEmployment.be  This test is not yet approved or cleared by the [place] FDA and has been authorized for detection and/or diagnosis of SARS-CoV-2 by FDA under an Emergency Use Authorization (EUA). This EUA will remain in effect (meaning this test can be used)  for the duration of the COVID-19 declaration under Section 564(b)(1) of the Act, 21 U.S.C. section 360bbb-3(b)(1), unless the authorization is terminated or revoked.  Performed at [facility], [address] [place], [place] [address]       Studies: DG Chest [place] 1 View  Result Date: 01/19/2021 CLINICAL DATA:  Shortness of breath EXAM: PORTABLE CHEST 1 VIEW COMPARISON:  Previous studies including the examination of 01/17/2021 FINDINGS: Transverse diameter of heart is increased. Central pulmonary vessels are more prominent. Diffuse interstitial and alveolar densities seen in both lungs. There is blunting of both lateral CP angles. There is no pneumothorax. IMPRESSION: Cardiomegaly. There is interval worsening of pulmonary vascular congestion and pulmonary edema. Possibility of underlying pneumonia is not excluded. Small bilateral pleural effusions. Electronically Signed   By: Elmer Picker M.D.   On: 01/19/2021 08:27   ECHOCARDIOGRAM COMPLETE  Result Date: 01/19/2021    ECHOCARDIOGRAM REPORT   Patient Name:   NYELA CORTINAS Date of Exam: 01/15/2021 Medical Rec #:  [id number]                 Height:       65.5 in  Accession #:    [id number]                Weight:       157.0 lb Date of Birth:  12-06-1929                 BSA:          1.795 m Patient Age:    65 years                  BP:           156/66 mmHg Patient Gender: F                         HR:           80 bpm. Exam Location:  [facility] Procedure: 2D Echo, Cardiac Doppler and Color Doppler Indications:     I21.4 NSTEMI  History:         Patient has prior history of Echocardiogram examinations, most                  recent 03/20/2020. Risk Factors:Diabetes and Hypertension.                  Chronic kidney disease.  Sonographer:     Cresenciano Lick RDCS Referring Phys:  [id number] Arvella Merles MANSY Diagnosing Phys: Ida Rogue MD IMPRESSIONS  1. Left ventricular ejection fraction, by estimation, is 55 to 60%. The left  ventricle has normal function. The left ventricle has no regional wall motion abnormalities. There is mild left ventricular hypertrophy. Left ventricular diastolic parameters are consistent with Grade II diastolic dysfunction (pseudonormalization).  2. Right ventricular systolic function is normal. The right ventricular size is normal. There is moderately elevated pulmonary artery systolic pressure. The estimated right ventricular systolic pressure is 47.8 mmHg.  3. The mitral valve is normal in structure. Mild to moderate mitral valve regurgitation. No evidence of mitral stenosis.  4. The aortic valve was not well visualized. Aortic valve regurgitation is not visualized. Aortic valve sclerosis/calcification is present, without any evidence of aortic stenosis.  5. The inferior vena cava is normal in size with greater than 50% respiratory variability, suggesting right atrial pressure of 3 mmHg. FINDINGS  Left Ventricle: Left ventricular ejection fraction, by estimation, is 55 to 60%. The left ventricle has normal function. The left ventricle has no regional wall motion abnormalities. The left ventricular internal cavity size was normal in size. There is  mild left ventricular hypertrophy. Left ventricular diastolic parameters are consistent with Grade II diastolic dysfunction (pseudonormalization). Right Ventricle: The right ventricular size is normal. No increase in right ventricular wall thickness. Right ventricular systolic function is normal. There is moderately elevated pulmonary artery systolic pressure. The tricuspid regurgitant velocity is 3.24 m/s, and with an assumed right atrial pressure of 5 mmHg, the estimated right ventricular systolic pressure is 29.5 mmHg. Left Atrium: Left atrial size was normal in size. Right Atrium: Right atrial size was normal in size. Pericardium: There is no evidence of pericardial effusion. Mitral Valve: The mitral valve is normal in structure. There is mild thickening of the  mitral valve leaflet(s). There is mild calcification of the mitral valve leaflet(s). Mild mitral annular calcification. Mild to moderate mitral valve regurgitation. No  evidence of mitral valve stenosis. Tricuspid Valve: The tricuspid valve is normal in structure. Tricuspid valve regurgitation is mild . No evidence of tricuspid stenosis. Aortic Valve: The aortic valve was not well  visualized. Aortic valve regurgitation is not visualized. Aortic valve sclerosis/calcification is present, without any evidence of aortic stenosis. Pulmonic Valve: The pulmonic valve was normal in structure. Pulmonic valve regurgitation is not visualized. No evidence of pulmonic stenosis. Aorta: The aortic root is normal in size and structure. Venous: The inferior vena cava is normal in size with greater than 50% respiratory variability, suggesting right atrial pressure of 3 mmHg. IAS/Shunts: No atrial level shunt detected by color flow Doppler.  LEFT VENTRICLE PLAX 2D LVIDd:         3.60 cm   Diastology LVIDs:         2.40 cm   LV e' medial:    6.31 cm/s LV PW:         1.20 cm   LV E/e' medial:  22.2 LV IVS:        1.20 cm   LV e' lateral:   7.62 cm/s LVOT diam:     1.80 cm   LV E/e' lateral: 18.4 LV SV:         45 LV SV Index:   25 LVOT Area:     2.54 cm  RIGHT VENTRICLE             IVC RV Basal diam:  3.60 cm     IVC diam: 1.30 cm RV S prime:     12.20 cm/s TAPSE (M-mode): 1.8 cm LEFT ATRIUM             Index        RIGHT ATRIUM           Index LA diam:        3.70 cm 2.06 cm/m   RA Area:     11.40 cm LA Vol (A2C):   40.8 ml 22.73 ml/m  RA Volume:   27.20 ml  15.15 ml/m LA Vol (A4C):   41.4 ml 23.07 ml/m LA Biplane Vol: 41.0 ml 22.84 ml/m  AORTIC VALVE LVOT Vmax:   81.65 cm/s LVOT Vmean:  61.800 cm/s LVOT VTI:    0.178 m  AORTA Ao Root diam: 2.90 cm MITRAL VALVE                TRICUSPID VALVE MV Area (PHT): 4.31 cm     TR Peak grad:   42.0 mmHg MV Decel Time: 176 msec     TR Vmax:        324.00 cm/s MV E velocity: 140.00 cm/s MV  A velocity: 105.00 cm/s  SHUNTS MV E/A ratio:  1.33         Systemic VTI:  0.18 m                             Systemic Diam: 1.80 cm Ida Rogue MD Electronically signed by Ida Rogue MD Signature Date/Time: 01/19/2021/10:20:25 AM    Final     Scheduled Meds:  aspirin EC  81 mg Oral Daily   atorvastatin  80 mg Oral Daily   insulin aspart  0-9 Units Subcutaneous Q4H   nebivolol  2.5 mg Oral Daily   potassium chloride  10 mEq Oral BID    Continuous Infusions:  furosemide (LASIX) 200 mg in dextrose 5% 100 mL (2mg /mL) infusion 4 mg/hr (01/19/21 1117)   heparin 1,000 Units/hr (01/19/21 0718)     LOS: 1 day     Kayleen Memos, MD [facility] Pager [phone]  If 7PM-7AM, please contact night-coverage [email or web address] Password  [email or web address] 01/19/2021, 12:00 PM

## 2021-01-19 NOTE — Progress Notes (Signed)
Attempted to place [facility] 45L 92% so pt can eat breakfast. Pt RR >>>40's and desat to the 80's. Pt placed back on BiPAP. Pt requested to eat or drink. Pt informed that she can not eat or drink while on BiPAP

## 2021-01-19 NOTE — ED Notes (Signed)
Respiratory at bedside.

## 2021-01-19 NOTE — Consult Note (Signed)
Consultation Note Date: 01/19/2021   Patient Name: Sheri Ryan  DOB: 1930-01-13  MRN: [id number]  Age / Sex: 88 y.o., female  PCP: Leonel Ramsay, MD Referring Physician: Kayleen Memos, DO  Reason for Consultation: Establishing goals of care  HPI/Patient Profile: 88 y.o. female  with past medical history of HFpEF, chronic kidney disease stage III, hypertension, diabetes, left eye blindness, CAD and PVD admitted on 02/14/2021 with shortness of breath and respiratory distress. Requiring bipap. Also with AKI. Requiring lasix drip. Also with AKI on CKD. Now receiving treatment for CAP. PMT consulted to discuss [place].   Clinical Assessment and Goals of Care: I have reviewed medical records including EPIC notes, labs and imaging, assessed the patient and then met with patient, son and husband  to discuss diagnosis prognosis, GOC, EOL wishes, disposition and options.  I introduced Palliative Medicine as specialized medical care for people living with serious illness. It focuses on providing relief from the symptoms and stress of a serious illness. The goal is to improve quality of life for both the patient and the family.  As far as functional and nutritional status, family tells me she was able to care for herself at home and maintained good appetite. They do report recent falls that were concerning to patient.    We discussed patient's current illness and what it means in the larger context of patient's on-going co-morbidities.  Natural disease trajectory and expectations at EOL were discussed. We discussed her heart failure, AKI, and respiratory failure. Discussed treatment for pna. Discussed lasix and heparin infusions.   I attempted to elicit values and goals of care important to the patient.    The difference between aggressive medical intervention and comfort care was considered in light of the patient's goals of care.   Attempted  to address goals of care however patient was somewhat confused. We discussed that her chart indicates she was DNI - she was not sure about this. Extensive discussion about intubation and what it entails - son did confirm that patient should remain DNI. Patient became quite anxious during conversation and family attempting to calm patient therefore further discussions was withheld though CPR status certainly needs to be discussed. Will attempt at another visit.   Discussed with patient/family the importance of continued conversation with family and the medical providers regarding overall plan of care and treatment options, ensuring decisions are within the context of the patients values and GOCs.    Questions and concerns were addressed. The family was encouraged to call with questions or concerns.   Primary Decision Maker PATIENT joined by spouse and son   SUMMARY OF RECOMMENDATIONS   - patient/family did confirm DNI - further discussion is needed about code status addressing CPR however patient became too anxious to continue and family attempting to calm her - discussed illness and interventions with family, education provided, all questions/concerns addressed - will see again 1/6  Code Status/Advance Care Planning: Limited code - DNI     Primary Diagnoses: Present on Admission:  Acute respiratory failure ([place])   I have reviewed the medical record, interviewed the patient and family, and examined the patient. The following aspects are pertinent.  Past Medical History:  Diagnosis Date   Blind left eye    Cataract    Chronic kidney disease    Diabetes mellitus without complication ([place])    Hypertension    Retinopathy    Social History   Socioeconomic History   Marital status: Married  Spouse name: Not on file   Number of children: 3   Years of education: Not on file   Highest education level: 12th grade  Occupational History   Occupation: Retired  Tobacco Use   Smoking  status: Former    Packs/day: 1.00    Years: 9.00    Pack years: 9.00    Types: Cigarettes    Quit date: 1975    Years since quitting: 48.0   Smokeless tobacco: Never   Tobacco comments:    smoking cessation materials not required  Vaping Use   Vaping Use: Never used  Substance and Sexual Activity   Alcohol use: Not Currently   Drug use: No   Sexual activity: Not Currently  Other Topics Concern   Not on file  Social History Narrative   Patient is Jehovah's Witness - no blood transfusions.   Social Determinants of Health   Financial Resource Strain: Not on file  Food Insecurity: Not on file  Transportation Needs: Not on file  Physical Activity: Not on file  Stress: Not on file  Social Connections: Not on file   Family History  Problem Relation Age of Onset   Hypertension Mother    Diabetes Brother    Scheduled Meds:  aspirin EC  81 mg Oral Daily   atorvastatin  80 mg Oral Daily   insulin aspart  0-9 Units Subcutaneous Q4H   nebivolol  2.5 mg Oral Daily   potassium chloride  10 mEq Oral BID   Continuous Infusions:  azithromycin     cefTRIAXone (ROCEPHIN)  IV     furosemide (LASIX) 200 mg in dextrose 5% 100 mL (37m/mL) infusion 4 mg/hr (01/19/21 1117)   heparin 1,000 Units/hr (01/19/21 0718)   PRN Meds:.acetaminophen **OR** acetaminophen, ipratropium-albuterol, magnesium hydroxide, ondansetron **OR** ondansetron (ZOFRAN) IV, traZODone No Known Allergies Review of Systems  Respiratory:  Positive for shortness of breath.    Physical Exam Pulmonary:     Comments: Increased RR on bipap Skin:    General: Skin is warm and dry.  Neurological:     Mental Status: She is alert and oriented to person, place, and time.     Comments: Some confusion noted during conversation    Vital Signs: BP 119/64    Pulse 81    Temp 98.9 F (37.2 C) (Axillary)    Resp 16    Wt 71.2 kg    SpO2 96%    BMI 25.73 kg/m  Pain Scale: 0-10   Pain Score: 0-No pain   SpO2: SpO2: 96  % O2 Device:SpO2: 96 % O2 Flow Rate: .O2 Flow Rate (L/min): 45 L/min  IO: Intake/output summary:  Intake/Output Summary (Last 24 hours) at 01/19/2021 1454 Last data filed at 01/19/2021 1119 Gross per 24 hour  Intake --  Output 300 ml  Net -300 ml    LBM:   Baseline Weight: Weight: 71.2 kg Most recent weight: Weight: 71.2 kg     Palliative Assessment/Data:PPS 30%    Time Total: 80 minutes Greater than 50%  of this time was spent counseling and coordinating care related to the above assessment and plan.  SJuel Burrow DNP, AGNP-C Palliative Medicine Team [phone]: [phone]

## 2021-01-19 NOTE — Progress Notes (Signed)
PT Cancellation Note  Patient Details Name: Sheri Ryan MRN: [id number] DOB: [birth year]   Cancelled Treatment:    Reason Eval/Treat Not Completed: Medical issues which prohibited therapy. Orders received and chart reviewed. Per RN pt unable to tolerate [place] at this time. Per RT note in EMR, attempts made to place [facility] with 45L at 92% for pt to eat breakfast with RR elevating to >40's and desaturation to 80's. PT plan to hold until more medically appropriate. Will continue to follow and initiate therapy when medically stable.    Salem Caster. Fairly IV, PT, DPT Physical Therapist- [facility]  01/19/2021, 8:20 AM
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
## 2021-01-20 ENCOUNTER — Inpatient Hospital Stay: Payer: Medicare Other

## 2021-01-20 DIAGNOSIS — Z7189 Other specified counseling: Secondary | ICD-10-CM | POA: Diagnosis not present

## 2021-01-20 DIAGNOSIS — I509 Heart failure, unspecified: Secondary | ICD-10-CM | POA: Diagnosis not present

## 2021-01-20 DIAGNOSIS — J9601 Acute respiratory failure with hypoxia: Secondary | ICD-10-CM | POA: Diagnosis not present

## 2021-01-20 DIAGNOSIS — N179 Acute kidney failure, unspecified: Secondary | ICD-10-CM | POA: Diagnosis not present

## 2021-01-20 LAB — CBC
HCT: 23.2 % — ABNORMAL LOW (ref 36.0–46.0)
Hemoglobin: 7.4 g/dL — ABNORMAL LOW (ref 12.0–15.0)
MCH: 27.9 pg (ref 26.0–34.0)
MCHC: 31.9 g/dL (ref 30.0–36.0)
MCV: 87.5 fL (ref 80.0–100.0)
Platelets: 202 10*3/uL (ref 150–400)
RBC: 2.65 MIL/uL — ABNORMAL LOW (ref 3.87–5.11)
RDW: 16.4 % — ABNORMAL HIGH (ref 11.5–15.5)
WBC: 24.4 10*3/uL — ABNORMAL HIGH (ref 4.0–10.5)
nRBC: 0.2 % (ref 0.0–0.2)

## 2021-01-20 LAB — COMPREHENSIVE METABOLIC PANEL
ALT: 25 U/L (ref 0–44)
AST: 34 U/L (ref 15–41)
Albumin: 3.1 g/dL — ABNORMAL LOW (ref 3.5–5.0)
Alkaline Phosphatase: 51 U/L (ref 38–126)
Anion gap: 13 (ref 5–15)
BUN: 75 mg/dL — ABNORMAL HIGH (ref 8–23)
CO2: 20 mmol/L — ABNORMAL LOW (ref 22–32)
Calcium: 8.3 mg/dL — ABNORMAL LOW (ref 8.9–10.3)
Chloride: 108 mmol/L (ref 98–111)
Creatinine, Ser: 3.1 mg/dL — ABNORMAL HIGH (ref 0.44–1.00)
GFR, Estimated: 14 mL/min — ABNORMAL LOW (ref >60)
Glucose, Bld: 122 mg/dL — ABNORMAL HIGH (ref 70–99)
Potassium: 4.4 mmol/L (ref 3.5–5.1)
Sodium: 141 mmol/L (ref 135–145)
Total Bilirubin: 0.7 mg/dL (ref 0.3–1.2)
Total Protein: 7.1 g/dL (ref 6.5–8.1)

## 2021-01-20 LAB — CBG MONITORING, ED
Glucose-Capillary: 204 mg/dL — ABNORMAL HIGH (ref 70–99)
Glucose-Capillary: 153 mg/dL — ABNORMAL HIGH (ref 70–99)
Glucose-Capillary: 121 mg/dL — ABNORMAL HIGH (ref 70–99)
Glucose-Capillary: 157 mg/dL — ABNORMAL HIGH (ref 70–99)

## 2021-01-20 LAB — BLOOD GAS, ARTERIAL
Acid-base deficit: 3.5 mmol/L — ABNORMAL HIGH (ref 0.0–2.0)
Bicarbonate: 21.4 mmol/L (ref 20.0–28.0)
Delivery systems: POSITIVE
Expiratory PAP: 9
FIO2: 1
Inspiratory PAP: 18
Mechanical Rate: 10
O2 Saturation: 82.9 %
Patient temperature: 37
pCO2 arterial: 37 mmHg (ref 32.0–48.0)
pH, Arterial: 7.37 (ref 7.350–7.450)
pO2, Arterial: 49 mmHg — ABNORMAL LOW (ref 83.0–108.0)

## 2021-01-20 LAB — LACTIC ACID, PLASMA: Lactic Acid, Venous: 3.1 mmol/L (ref 0.5–1.9)

## 2021-01-20 LAB — VITAMIN B12: Vitamin B-12: 1480 pg/mL — ABNORMAL HIGH (ref 180–914)

## 2021-01-20 LAB — BRAIN NATRIURETIC PEPTIDE: B Natriuretic Peptide: 2556.6 pg/mL — ABNORMAL HIGH (ref 0.0–100.0)

## 2021-01-20 LAB — PROCALCITONIN: Procalcitonin: 39.95 ng/mL

## 2021-01-20 MED ORDER — DEXMEDETOMIDINE HCL IN NACL 400 MCG/100ML IV SOLN: 0.4000 ug/kg/h | INTRAVENOUS | Status: DC

## 2021-01-20 MED ORDER — MORPHINE SULFATE (PF) 2 MG/ML IV SOLN
2.0000 mg | Freq: Once | INTRAVENOUS | Status: AC
  Administered 2021-01-20: 2 mg via INTRAVENOUS
  Filled 2021-01-20: qty 1

## 2021-01-20 MED ORDER — NOREPINEPHRINE 4 MG/250ML-% IV SOLN: 2.0000 ug/min | INTRAVENOUS | Status: DC

## 2021-01-20 MED ORDER — SODIUM CHLORIDE 0.9 % IV SOLN: 250.0000 mL | INTRAVENOUS | Status: DC

## 2021-01-20 MED ORDER — NOREPINEPHRINE 4 MG/250ML-% IV SOLN: 0.0000 ug/min | INTRAVENOUS | Status: DC

## 2021-01-20 MED ORDER — EPINEPHRINE 1 MG/10ML IJ SOSY
PREFILLED_SYRINGE | INTRAMUSCULAR | Status: AC | PRN
  Administered 2021-01-20: 1 mg via INTRAVENOUS

## 2021-01-20 MED ORDER — ASPIRIN 300 MG RE SUPP: 300.0000 mg | Freq: Every day | RECTAL | Status: DC

## 2021-01-20 MED ORDER — NOREPINEPHRINE 4 MG/250ML-% IV SOLN
INTRAVENOUS | Status: AC
  Filled 2021-01-20: qty 250

## 2021-01-23 MED FILL — Medication: Qty: 1 | Status: AC

## 2021-01-24 LAB — CULTURE, BLOOD (ROUTINE X 2)
Culture: NO GROWTH
Culture: NO GROWTH
Special Requests: ADEQUATE
Special Requests: ADEQUATE

## 2021-02-14 ENCOUNTER — Ambulatory Visit: Payer: Medicare Other | Admitting: Cardiovascular Disease

## 2021-02-15 NOTE — ED Notes (Addendum)
Dr. Lanney Gins notified this RN that he has spoken with pt's family and they are not ready to make the pt. DNR, they would like to discuss intubation with entire family before they make decision because pt. Is very high functioning at baseline and is still responding to them. Family also does not want precedex drip started.

## 2021-02-15 NOTE — ED Notes (Addendum)
Asystole, Dr. Loni Muse and Dr. Nevada Crane notified.

## 2021-02-15 NOTE — ED Notes (Signed)
Dr. Nevada Crane to bedside, speaking with pt's son. Explaining end of life care.

## 2021-02-15 NOTE — ED Notes (Signed)
Dr. Nevada Crane at bedside, discussing end of life care with pt and family.

## 2021-02-15 NOTE — ED Notes (Signed)
Provider made aware of the patients current condition - RN will continue to monitor - Safety sitter present at the bedside.

## 2021-02-15 NOTE — ED Notes (Addendum)
This RN at bedside, with Claiborne Billings, RN, pt's HR dropping, this RN feels for pulse, no pulse, CPR initiated, Claiborne Billings, RN brings code cart to bedside. ED physician notified. ED physician, RT and Dr. Lanney Gins to bedside.

## 2021-02-15 NOTE — ED Notes (Signed)
RT at bedside, removed pt's dentures and obtaining blood gas.

## 2021-02-15 NOTE — ED Notes (Signed)
This RN unable to obtain respiratory panel at this time due to the patients present respiratory status and BiPAP dependency.

## 2021-02-15 NOTE — Progress Notes (Signed)
PT Cancellation Note  Patient Details Name: Sheri Ryan MRN: [id number] DOB: [birth year]   Cancelled Treatment:    Reason Eval/Treat Not Completed: Medical issues which prohibited therapy. Per RN via secure chat pt inappropriate for PT intervention at this time. Per RN note in EMR, discussion of end of life care. PT continue to follow at this time and will intervene as medically appropriate.    Salem Caster. Fairly IV, PT, DPT Physical Therapist- [facility]  2021-02-15, 8:16 AM

## 2021-02-15 NOTE — ED Notes (Signed)
Dr. Nevada Crane and Dr. Lanney Gins notified pt's BP is now 44/38, Dr. Lanney Gins paged to room.

## 2021-02-15 NOTE — ED Notes (Addendum)
Dr. Lanney Gins and this RN speaking with pt's family in family room for clarification of pt's end of life care.  Family states they would like to wait until 1400 when pt's spouse arrives to make decisions about DNR, but they know pt. Does not want to be intubated. This RN asks for clarification about if something happens between now and their decision, what would they like to happen. Family states they would like CPR, but no intubation. This RN confirmed with Dr. Lanney Gins that pt. Is currently DNI, but NOT DNR.

## 2021-02-15 NOTE — ED Notes (Addendum)
Son, [name], and father, Normal, informed nurse that they were trying to decide between crematoriums and would contact us 01/21/2021 regarding family decision. Both sons and the father was listed on the funeral home form. Gave son and father information for the main hospital number and told them to contact the [facility] regarding crematorium information.

## 2021-02-15 NOTE — ED Notes (Signed)
This RN spoke with pt's son, they have several family members who are coming to see pt. Will provide this RN with funeral home information.

## 2021-02-15 NOTE — ED Notes (Addendum)
Dr. Lanney Gins nnotified this RN that family is not interested in precedex drip anymore, will d/c order.

## 2021-02-15 NOTE — ED Notes (Addendum)
RT at bedside, adjusting pt's bipap mask. Dr. Lanney Gins notified by this RN that pt's mask has been adjusted, pt. Still sating 80%. Will continue to monitor.

## 2021-02-15 NOTE — Progress Notes (Signed)
Daily Progress Note   Patient Name: Sheri Ryan       Date: Feb 12, 2021 DOB: 1929/08/09  Age: 88 y.o. MRN#: [id number] Attending Physician: Sheri Memos, DO Primary Care Physician: Sheri Ramsay, MD Admit Date: 02/02/2021  Reason for Consultation/Follow-up: Establishing goals of care  Subjective: Patient with significant tachypnea, sats in 80s on 100% FiO2 bipap.  Alert but confused. Tells me she feels okay. Friend at bedside. 2 sons in consult room - discussion with them as below.   Length of Stay: 2  Current Medications: Scheduled Meds:   aspirin  300 mg Rectal Daily   atorvastatin  80 mg Oral Daily   insulin aspart  0-9 Units Subcutaneous Q4H   nebivolol  2.5 mg Oral Daily   potassium chloride  10 mEq Oral BID    Continuous Infusions:  sodium chloride     azithromycin Stopped (01/19/21 2205)   cefTRIAXone (ROCEPHIN)  IV Stopped (01/19/21 2105)   dexmedetomidine (PRECEDEX) IV infusion     furosemide (LASIX) 200 mg in dextrose 5% 100 mL (2mg /mL) infusion 8 mg/hr (2021/02/12 0747)   norepinephrine (LEVOPHED) Adult infusion      PRN Meds: acetaminophen **OR** acetaminophen, ipratropium-albuterol, magnesium hydroxide, ondansetron **OR** ondansetron (ZOFRAN) IV, traZODone  Physical Exam Constitutional:      Comments: lethargic  Cardiovascular:     Rate and Rhythm: Normal rate and regular rhythm.  Pulmonary:     Comments: tachypnea Skin:    General: Skin is warm and dry.            Vital Signs: BP (!) 108/52    Pulse 74    Temp 98.9 F (37.2 C) (Axillary)    Resp (!) 38    Wt 71.2 kg    SpO2 (!) 84%    BMI 25.73 kg/m  SpO2: SpO2: (!) 84 % O2 Device: O2 Device: Bi-PAP O2 Flow Rate: O2 Flow Rate (L/min): 45 L/min  Intake/output summary:  Intake/Output Summary  (Last 24 hours) at 12-Feb-2021 0932 Last data filed at 2021/02/12 0730 Gross per 24 hour  Intake 674.58 ml  Output 300 ml  Net 374.58 ml   LBM:   Baseline Weight: Weight: 71.2 kg Most recent weight: Weight: 71.2 kg       Palliative Assessment/Data: PPS 10%    Flowsheet Rows    Flowsheet Row Most Recent Value  Intake Tab   Referral Department Hospitalist  Unit at Time of Referral ER  Palliative Care Primary Diagnosis Pulmonary  Date Notified 01/19/21  Palliative Care Type Return patient Palliative Care  Reason for referral Clarify Goals of Care  Date of Admission 01/27/2021  Date first seen by Palliative Care 01/19/21  # of days Palliative referral response time 0 Day(s)  # of days IP prior to Palliative referral 1  Clinical Assessment   Psychosocial & Spiritual Assessment   Palliative Care Outcomes        Patient Active Problem List   Diagnosis Date Noted   Acute respiratory failure ([place]) 01/24/2021   Heart failure, unspecified ([place]) 06/15/2020   Diabetic ketoacidosis without coma associated with type 2 diabetes mellitus ([place])    Anemia    Acute pulmonary edema ([place])  Chronic kidney disease (CKD) stage G3b/A1, moderately decreased glomerular filtration rate (GFR) between 30-44 mL/min/1.73 square meter and albuminuria creatinine ratio less than 30 mg/g ([facility])    Respiratory failure ([place]) 03/19/2020   Pain of finger joint 08/07/2019   Gouty arthritis of right great toe 02/24/2018   Blindness of left eye 09/27/2017   Non-toxic multinodular goiter 09/27/2017   Left shoulder pain 04/30/2017   Neck pain 04/30/2017   Cervical radiculopathy at C6 04/23/2017   Cervical disc disorder at C5-C6 level with radiculopathy 04/15/2017   Type II diabetes mellitus with complication ([place]) 84/69/6295   Impaired functional mobility, balance, gait, and endurance 28/41/3244   Hypertrophic lichen planus 01/17/7251   Hyperlipidemia associated with type 2 diabetes mellitus ([place]) 06/27/2014    Essential (primary) hypertension 06/27/2014   Acid reflux 06/27/2014   Arthritis of knee, degenerative 06/27/2014   Hyperlipidemia 10/12/2011    Palliative Care Assessment & Plan   HPI: 88 y.o. female  with past medical history of HFpEF, chronic kidney disease stage III, hypertension, diabetes, left eye blindness, CAD and PVD admitted on 01/15/2021 with shortness of breath and respiratory distress. Requiring bipap. Also with AKI. Requiring lasix drip. Also with AKI on CKD. Now receiving treatment for CAP. PMT consulted to discuss [place].    Assessment: Patient worsening overnight - more dependent on bipap, oxygen in 80s despite 100% FiO2. RR in 79s. Creatinine worse.  Spoke with family - 2 sons in consult room Sheri Ryan and Sheri Ryan). They share that they family is making decisions jointly but patient's spouse is at home resting - he is in his 38s and decision making is difficult for him.   We discuss patient's worsening status overnight and poor prognosis. They tell me they understand patient is dying and time is limited. We discussed how to approach her care moving forward - continuing current measures vs full comfort approach. They share that other family members are coming to visit therefore they would like to continue current measures for now. We discuss potential transition to comfort measures later today - family will call if they would like to pursue this.   Family shares that patient would not want to continue this way - she is getting tired of medical interventions such as lab draws and IVs.   We discuss code status change to full DNR - discuss that she does not want intubation it does not make sense to put her through CPR. Family agrees with this and agrees to code status change to DNR.   Family tells me they are interested in initiating precedex infusion - this was discussed with them by other providers. They are hopeful this will provide some relief for patient.    Recommendations/Plan: Code status changed to full DNR Continue current care for now including bipap, family understands poor prognosis despite current interventions Family may shift to comfort measures only later in day after family visits - they have my contact and will call if they become interested in this Family interested in initiation of precedex infusion  Code Status: DNR  Care plan was discussed with patient and sons, CCM NP Darlyn Chamber, Dr. Nevada Crane, RN  Thank you for allowing the Palliative Medicine Team to assist in the care of this patient.   Total Time 40 minutes Prolonged Time Billed  no       Greater than 50%  of this time was spent counseling and coordinating care related to the above assessment and plan.  Juel Burrow, DNP, AGNP-C Palliative Medicine  Team Team Phone # [phone]  Pager [phone]

## 2021-02-15 NOTE — Progress Notes (Signed)
OT Cancellation Note  Patient Details Name: Sheri Ryan MRN: [id number] DOB: [birth year]   Cancelled Treatment:    Reason Eval/Treat Not Completed: Patient not medically ready. Per RN via secure chat pt inappropriate for OT intervention at this time. Per RN note in EMR, discussion of end of life care. OT continue to follow at this time and will intervene as medically appropriate.   Dessie Coma, M.S. OTR/L  Jan 24, 2021, 8:32 AM  ascom [phone]

## 2021-02-15 NOTE — ED Notes (Addendum)
Dr. Nevada Crane and Dr. Lanney Gins notified pt. Sating 79-80%. Dr. Lanney Gins coming to see pt.

## 2021-02-15 NOTE — ED Notes (Signed)
Pt's family to bedside.

## 2021-02-15 NOTE — ED Notes (Signed)
This RN noticed that the patients O2 sats were decreasing on the monitor at the Nurses station - this RN came to the patients room and discovered the patient had removed her BiPAP tubing and was altered with a HR of 27. This RN replaced the BiPAP tubing - contacted RT & Charge RN whom were present at the bedside along with EDP Beather Arbour). Once the mask was reapplied the patients O2 sats improved to 89% and her HR improved to 82. Provider Rachael Fee, NP) notified of the incident.

## 2021-02-15 NOTE — Progress Notes (Signed)
PROGRESS NOTE  Sheri Ryan VPX:[id number] DOB: 1929-02-13 DOA: 02/12/2021 PCP: Leonel Ramsay, MD  HPI/Recap of past 24 hours: Sheri Ryan is a 88 y.o. African-American female with medical history significant for type 2 diabetes mellitus, hypertension, CKD4, Jehova's witness, cervical radiculopathy, who presented to [facility] ED with acute onset of worsening respiratory distress.  Associated with mild lower extremity edema.  Work-up revealed acute on chronic hypoxic respiratory failure requiring BiPAP, elevated troponin >800, acute on chronic diastolic CHF, pulmonary edema with BNP greater than 2000.  She was started on heparin drip, diuresed, and cardiology was consulted.  Seen by cardiology, elevated troponin thought to be secondary to demand ischemia in the setting of acute respiratory distress, pulm edema, pulmonary hypertension, worsening anemia, chronic kidney disease.  Hospital course complicated by worsening respiratory status with acute distress on Feb 18, 2021.  BiPAP dependent on FiO2 of 100% with O2 saturation in the low 80s.  Chest x-ray shows worsening bilateral pulmonary infiltrates.  Family at bedside.  Patient is DNI.  Her sons confirmed her CODE STATUS.   02/18/2021: Patient was seen and examined at her bedside.  She is on BiPAP.  Use of accessory muscles to breathe.  Worsening respiratory status.  DNI confirmed by her sons at bedside.  Seen by palliative care team, patient's CODE STATUS was changed to DNR.  Assessment/Plan: Principal Problem:   Acute respiratory failure (HCC)  Worsening severe acute on chronic hypoxic respiratory failure multifactorial secondary to worsening acute pulmonary edema superimposed by community-acquired pneumonia with concern for ARDS, POA Patient had a repeated chest x-ray done on 01/19/2021, could not exclude pneumonia, was started on IV empiric antibiotics Rocephin and IV azithromycin on 01/19/2021 Sputum culture ordered but not  collected due to BiPAP dependence. At baseline she is on oxygen supplementation per her son at bedside. Repeat a chest x-ray on Feb 18, 2021, personally reviewed showing worsening bilateral pulmonary infiltrates with concern for ARDS in the setting of community-acquired pneumonia. She is currently on BiPAP with FiO2 of 100% with O2 saturation in the low 80s. PCCM following, appreciate assistance. Continue Lasix drip and IV antibiotics empirically. She was also seen by ID recommendation to continue IV azithromycin and Rocephin for community-acquired pneumonia. WBC and procalcitonin escalating 24,000 from 22,000, 39 from 14, respectively. Blood cultures negative to date Very poor prognosis-seen by palliative care team, CODE STATUS changed to DNR Feb 18, 2021 from DNI.  Elevated troponin vs NSTEMI, suspect demand ischemia in the setting of severe hypoxemia Presented with elevated troponin, peaked at 937 and down trended. 2D echo ordered, results are pending Seen by cardiology, recommended 48 hours heparin drip, started 01/28/2021. Started daily aspirin  Severe sepsis secondary to community-acquired pneumonia, POA Seen on chest x-ray Blood cultures x2 peripherally negative to date. Continue to follow blood cultures and sputum culture if able to obtain  Continue empiric IV antibiotics as stated above.  Acute on chronic diastolic CHF As evidenced by pulmonary edema seen on chest x-ray, right JVD, elevated BNP BNP is uptrending, greater than 2500 from 2000. Currently on Lasix drip at 8 cc/h continuously Cardiology following Continue strict I's and O's and daily weight 2D echo showed normal LVEF with grade 2 diastolic dysfunction.  Worsening AKI on CKD 4 Baseline creatinine appears to be 2.1 with GFR of 21. Creatinine rising 3.10 from 2.4 with GFR of 14 from 18. Continue to avoid nephrotoxic agents and hypotension. Closely monitor urine output Nephrology consulted and following.  Anion gap  metabolic acidosis in the setting of  acute kidney injury on CKD 4. Serum bicarb 20, anion gap of 13. Normal pH on blood gas Continue to closely monitor and treat as indicated.  Anemia of chronic disease Hemoglobin downtrending 7.4 from 8.0 from 9.4 No overt bleeding History of iron deficiency anemia Very unstable for blood transfusion with worsening severe bilateral pulmonary infiltrates.  Type 2 diabetes with hyperglycemia Hemoglobin A1c, 6.1 on 01/19/21, within prediabetes range. Continue judicious insulin sliding scale and avoid hypoglycemia  Essential hypertension, BPs are currently soft Holding off most of her home oral antihypertensive Continue to closely monitor vital signs Levophed if develops septic shock  Physical debility Hold off PT OT, patient very unstable at this time. Fall precautions are in place.  Goals of care Palliative care team consulted to assist with establishing goals of care Currently CODE STATUS changed to DNR from DNI on 2021-02-05.     Critical care time: 65 minutes.      Code Status: DNR  Family Communication: Updated both sons at bedside on 02-05-2021.  Disposition Plan: Very poor prognosis at this time.  Consultants: Cardiology. Nephrology. Infectious disease. PCCM.  Procedures: 2D echo  Antimicrobials: Rocephin Azithromycin  DVT prophylaxis: Subcu Lovenox daily, heparin drip DC'd on 02/05/21.  Status is: Inpatient  Inpatient status.  Patient requires at least 2 midnights for further evaluation and treatment of present condition      Objective: Vitals:   02/05/2021 1145 02/05/21 1200 05-Feb-2021 1245 2021-02-05 1300  BP:  (!) 108/55  107/68  Pulse: 69 66 67 67  Resp: (!) 30 (!) 37 (!) 32 (!) 37  Temp:      TempSrc:      SpO2: (!) 84% (!) 80% (!) 82% (!) 80%  Weight:        Intake/Output Summary (Last 24 hours) at 2021/02/05 1314 Last data filed at 02/05/2021 0730 Gross per 24 hour  Intake 674.58 ml  Output --  Net 674.58  ml   Filed Weights   02/09/2021 0509  Weight: 71.2 kg    Exam:  General: 88 y.o. year-old female frail-appearing on BiPAP.  Use of accessory muscles to breathe. Cardiovascular: Tachycardic with no rubs or gallops.  Right JVD noted.   Respiratory: Diffuse rales bilaterally.  Accessory muscles to breathe.  On BiPAP.   Abdomen: Soft nontender normal bowel sounds present.   Musculoskeletal: Trace lower extremity edema bilaterally.   Skin: No ulcerative lesions noted.  No rashes noted.   Psychiatry: Unable to assess mood due to acute respiratory distress. Neuro: Nonfocal exam.  Moves all 4 extremities.   Data Reviewed: CBC: Recent Labs  Lab 02/01/2021 0306 01/19/21 0913 Feb 05, 2021 0738  WBC 16.7* 22.1* 24.4*  NEUTROABS 14.4*  --   --   HGB 9.4* 8.0* 7.4*  HCT 29.2* 25.1* 23.2*  MCV 85.6 85.4 87.5  PLT 250 217 250   Basic Metabolic Panel: Recent Labs  Lab 01/28/2021 0306 01/19/21 0913 02-05-2021 0738  NA 136 139 141  K 4.3 4.2 4.4  CL 105 106 108  CO2 21* 22 20*  GLUCOSE 276* 244* 122*  BUN 48* 58* 75*  CREATININE 2.19* 2.48* 3.10*  CALCIUM 8.9 8.7* 8.3*  MG  --  2.3  --   PHOS  --  4.6  --    GFR: Estimated Creatinine Clearance: 12.3 mL/min (A) (by C-G formula based on SCr of 3.1 mg/dL (H)). Liver Function Tests: Recent Labs  Lab February 05, 2021 0738  AST 34  ALT 25  ALKPHOS 51  BILITOT 0.7  PROT 7.1  ALBUMIN 3.1*   No results for input(s): LIPASE, AMYLASE in the last 168 hours. No results for input(s): AMMONIA in the last 168 hours. Coagulation Profile: Recent Labs  Lab 01/19/2021 0455  INR 1.4*   Cardiac Enzymes: No results for input(s): CKTOTAL, CKMB, CKMBINDEX, TROPONINI in the last 168 hours. BNP (last 3 results) No results for input(s): PROBNP in the last 8760 hours. HbA1C: Recent Labs    01/19/21 0913  HGBA1C 6.0*   CBG: Recent Labs  Lab 01/19/21 2113 01/22/2021 0000 01-22-21 0310 2021-01-22 0735 2021-01-22 1220  GLUCAP 177* 204* 153* 121* 157*    Lipid Profile: Recent Labs    01/19/21 0913  CHOL 189  HDL 55  LDLCALC 117*  TRIG 84  CHOLHDL 3.4   Thyroid Function Tests: Recent Labs    01/29/2021 0527  TSH 1.641   Anemia Panel: Recent Labs    01/19/21 2240  VITAMINB12 1,480*  FERRITIN 463*  TIBC 213*  IRON 14*  RETICCTPCT 2.4   Urine analysis:    Component Value Date/Time   COLORURINE YELLOW (A) 03/23/2020 1530   APPEARANCEUR CLOUDY (A) 03/23/2020 1530   LABSPEC 1.008 03/23/2020 1530   PHURINE 5.0 03/23/2020 1530   GLUCOSEU NEGATIVE 03/23/2020 1530   HGBUR MODERATE (A) 03/23/2020 1530   BILIRUBINUR NEGATIVE 03/23/2020 1530   KETONESUR NEGATIVE 03/23/2020 1530   PROTEINUR 100 (A) 03/23/2020 1530   NITRITE NEGATIVE 03/23/2020 1530   LEUKOCYTESUR LARGE (A) 03/23/2020 1530   Sepsis Labs: @LABRCNTIP (procalcitonin:4,lacticidven:4)  ) Recent Results (from the past 240 hour(s))  Resp Panel by RT-PCR (Flu A&B, Covid) Nasopharyngeal Swab     Status: None   Collection Time: 01/15/2021  3:06 AM   Specimen: Nasopharyngeal Swab; Nasopharyngeal(NP) swabs in vial transport medium  Result Value Ref Range Status   SARS Coronavirus 2 by RT PCR NEGATIVE NEGATIVE Final    Comment: (NOTE) SARS-CoV-2 target nucleic acids are NOT DETECTED.  The SARS-CoV-2 RNA is generally detectable in upper respiratory specimens during the acute phase of infection. The lowest concentration of SARS-CoV-2 viral copies this assay can detect is 138 copies/mL. A negative result does not preclude SARS-Cov-2 infection and should not be used as the sole basis for treatment or other patient management decisions. A negative result may occur with  improper specimen collection/handling, submission of specimen other than nasopharyngeal swab, presence of viral mutation(s) within the areas targeted by this assay, and inadequate number of viral copies(<138 copies/mL). A negative result must be combined with clinical observations, patient history, and  epidemiological information. The expected result is Negative.  Fact Sheet for Patients:  EntrepreneurPulse.com.au  Fact Sheet for Healthcare Providers:  IncredibleEmployment.be  This test is no t yet approved or cleared by the [place] FDA and  has been authorized for detection and/or diagnosis of SARS-CoV-2 by FDA under an Emergency Use Authorization (EUA). This EUA will remain  in effect (meaning this test can be used) for the duration of the COVID-19 declaration under Section 564(b)(1) of the Act, 21 U.S.C.section 360bbb-3(b)(1), unless the authorization is terminated  or revoked sooner.       Influenza A by PCR NEGATIVE NEGATIVE Final   Influenza B by PCR NEGATIVE NEGATIVE Final    Comment: (NOTE) The Xpert Xpress SARS-CoV-2/FLU/RSV plus assay is intended as an aid in the diagnosis of influenza from Nasopharyngeal swab specimens and should not be used as a sole basis for treatment. Nasal washings and aspirates are unacceptable for Xpert Xpress SARS-CoV-2/FLU/RSV testing.  Fact  Sheet for Patients: EntrepreneurPulse.com.au  Fact Sheet for Healthcare Providers: IncredibleEmployment.be  This test is not yet approved or cleared by the [place] FDA and has been authorized for detection and/or diagnosis of SARS-CoV-2 by FDA under an Emergency Use Authorization (EUA). This EUA will remain in effect (meaning this test can be used) for the duration of the COVID-19 declaration under Section 564(b)(1) of the Act, 21 U.S.C. section 360bbb-3(b)(1), unless the authorization is terminated or revoked.  Performed at [facility], [address] [place], [place] [address]   CULTURE, BLOOD (ROUTINE X 2) w Reflex to ID Panel     Status: None (Preliminary result)   Collection Time: 01/19/21  1:49 PM   Specimen: BLOOD LEFT HAND  Result Value Ref Range Status   Specimen Description BLOOD LEFT HAND   Final   Special Requests   Final    BOTTLES DRAWN AEROBIC AND ANAEROBIC Blood Culture adequate volume   Culture   Final    NO GROWTH < 24 HOURS Performed at [facility], [address], [place], [place] [address]    Report Status PENDING  Incomplete  CULTURE, BLOOD (ROUTINE X 2) w Reflex to ID Panel     Status: None (Preliminary result)   Collection Time: 01/19/21  1:50 PM   Specimen: BLOOD LEFT HAND  Result Value Ref Range Status   Specimen Description BLOOD LEFT HAND  Final   Special Requests AEROBIC BOTTLE ONLY Blood Culture adequate volume  Final   Culture   Final    NO GROWTH < 24 HOURS Performed at [facility], [address] [place], [place] [address]    Report Status PENDING  Incomplete      Studies: DG Chest Port 1 View  Result Date: 04-Feb-2021 CLINICAL DATA:  Hypoxia. EXAM: PORTABLE CHEST 1 VIEW COMPARISON:  Chest XR 01/19/2021 and 01/25/2021. FINDINGS: Cardiomegaly. Hypoinflation. Worsening perihilar basilar interstitial edema. No pleural effusion. No interval osseous abnormality. IMPRESSION: Cardiomegaly with worsened coarse perihilar interstitial opacities. Findings consistent with worsening pulmonary edema. Electronically Signed   By: Michaelle Birks M.D.   On: 02/04/2021 07:49    Scheduled Meds:  aspirin  300 mg Rectal Daily   atorvastatin  80 mg Oral Daily   insulin aspart  0-9 Units Subcutaneous Q4H   nebivolol  2.5 mg Oral Daily   potassium chloride  10 mEq Oral BID    Continuous Infusions:  azithromycin Stopped (01/19/21 2205)   cefTRIAXone (ROCEPHIN)  IV Stopped (01/19/21 2105)   furosemide (LASIX) 200 mg in dextrose 5% 100 mL (2mg /mL) infusion 8 mg/hr (02/04/21 0747)     LOS: 2 days     Kayleen Memos, MD [facility] Pager [phone]  If 7PM-7AM, please contact night-coverage [email or web address] Password [email or web address] 02/04/2021, 1:14 PM

## 2021-02-15 NOTE — Death Summary Note (Addendum)
Death Summary  Sheri Ryan FXT:[id number] DOB: 1929/02/08 DOA: February 02, 2021  PCP: Leonel Ramsay, MD  Admit date: February 02, 2021 Date of Death: Feb 04, 2021 Time of Death: April 15, 1599 Notification: Leonel Ramsay, MD notified of death of 02-04-21   History of present illness:  Sheri Ryan is a 88 y.o. female with a history of  type 2 diabetes mellitus, hypertension, CKD4, Jehova's witness, cervical radiculopathy, who presented to [facility] ED with acute onset of worsening respiratory distress.  Associated with mild lower extremity edema.  Work-up revealed acute on chronic hypoxic respiratory failure requiring BiPAP, elevated troponin >800, acute on chronic diastolic CHF, pulmonary edema with BNP greater than 1998-04-15.  She was started on heparin drip, diuresed, and cardiology was consulted.  Seen by cardiology, elevated troponin thought to be secondary to demand ischemia in the setting of acute respiratory distress, pulmonary edema, pulmonary hypertension, worsening anemia, chronic kidney disease.   Hospital course complicated by worsening respiratory status with acute respiratory distress on 02-04-21.  Became BiPAP dependent due to severe hypoxemia with oxygen saturation in the 80's on BIPAP with FiO2 of 100%.  Chest x-ray repeated on 04-Feb-2021 shows worsening bilateral pulmonary infiltrates.  Repeated ABG on February 04, 2021 shows worsening hypoxemia 7.37/37/49 on 100% FiO2.  Family updated at bedside and made aware of the poor prognosis and high risk for death.  Her sons confirmed her CODE STATUS of DNI.   Patient was seen by palliative care team, CODE STATUS changed to DNR.  Patient's condition continued to deteriorate despite maximal non invasive ventilation.  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  On Feb 04, 2021 at 1601, Sheri Ryan was pronounced deceased.  Her family members were at bedside.   Final Diagnoses:   Worsening severe  acute on chronic hypoxic respiratory failure, multifactorial secondary to worsening acute pulmonary edema superimposed by community-acquired pneumonia with concern for ARDS, POA.   Elevated troponin 2/2 to demand ischemia in the setting of severe hypoxemia.   Severe sepsis secondary to community-acquired pneumonia, POA   Acute on chronic diastolic CHF   Worsening AKI on CKD 4   Anion gap metabolic acidosis in the setting of acute kidney injury on CKD 4.   Anemia of chronic disease   Type 2 diabetes with hyperglycemia   Essential hypertension   Physical debility   Goals of care discussions-  Returned to see the patient 3 times and spent at least 2 hours discussing care and management of the patient, including discussions with family, palliative care, nursing staff, and PCCM.    Code Status: DNR     Consultants: Cardiology. Nephrology. Infectious disease. PCCM.   Procedures: 2D echo   Antimicrobials: Rocephin Azithromycin    The results of significant diagnostics from this hospitalization (including imaging, microbiology, ancillary and laboratory) are listed below for reference.    Significant Diagnostic Studies: DG Chest Port 1 View  Result Date: 02/04/21 CLINICAL DATA:  Hypoxia. EXAM: PORTABLE CHEST 1 VIEW COMPARISON:  Chest XR 01/19/2021 and February 02, 2021. FINDINGS: Cardiomegaly. Hypoinflation. Worsening perihilar basilar interstitial edema. No pleural effusion. No interval osseous abnormality. IMPRESSION: Cardiomegaly with worsened coarse perihilar interstitial opacities. Findings consistent with worsening pulmonary edema. Electronically Signed   By: Michaelle Birks M.D.   On: 2021-02-04 07:49   DG Chest Port 1 View  Result Date: 01/19/2021 CLINICAL DATA:  Shortness of breath EXAM: PORTABLE CHEST 1 VIEW COMPARISON:  Previous studies including the examination of 02-Feb-2021 FINDINGS: Transverse diameter of heart  is increased. Central pulmonary vessels are more prominent.  Diffuse interstitial and alveolar densities seen in both lungs. There is blunting of both lateral CP angles. There is no pneumothorax. IMPRESSION: Cardiomegaly. There is interval worsening of pulmonary vascular congestion and pulmonary edema. Possibility of underlying pneumonia is not excluded. Small bilateral pleural effusions. Electronically Signed   By: Elmer Picker M.D.   On: 01/19/2021 08:27   DG Chest Portable 1 View  Result Date: 01/31/2021 CLINICAL DATA:  88 year old female with shortness of breath, hypoxia. EXAM: PORTABLE CHEST 1 VIEW COMPARISON:  Portable chest 03/23/2020 and earlier. FINDINGS: Portable AP upright view at 0350 hours. There is cardiomegaly. Other mediastinal contours are within normal limits. Stable lung volumes. Diffuse increased pulmonary interstitial opacity with basilar predominance and additional vague bibasilar veiling opacity. No pneumothorax or air bronchograms. Visualized tracheal air column is within normal limits. Calcified aortic atherosclerosis. Negative visible bowel gas. No acute osseous abnormality identified. IMPRESSION: Recurrent acute pulmonary edema. Possible small pleural effusions. Underlying cardiomegaly. Electronically Signed   By: Genevie Ann M.D.   On: 02/14/2021 04:26   ECHOCARDIOGRAM COMPLETE  Result Date: 01/19/2021    ECHOCARDIOGRAM REPORT   Patient Name:   Sheri Ryan Date of Exam: 02/04/2021 Medical Rec #:  [id number]                 Height:       65.5 in Accession #:    [id number]                Weight:       157.0 lb Date of Birth:  06/22/1929                 BSA:          1.795 m Patient Age:    30 years                  BP:           156/66 mmHg Patient Gender: F                         HR:           80 bpm. Exam Location:  [facility] Procedure: 2D Echo, Cardiac Doppler and Color Doppler Indications:     I21.4 NSTEMI  History:         Patient has prior history of Echocardiogram examinations, most                  recent 03/20/2020. Risk  Factors:Diabetes and Hypertension.                  Chronic kidney disease.  Sonographer:     Cresenciano Lick RDCS Referring Phys:  [id number] Arvella Merles MANSY Diagnosing Phys: Ida Rogue MD IMPRESSIONS  1. Left ventricular ejection fraction, by estimation, is 55 to 60%. The left ventricle has normal function. The left ventricle has no regional wall motion abnormalities. There is mild left ventricular hypertrophy. Left ventricular diastolic parameters are consistent with Grade II diastolic dysfunction (pseudonormalization).  2. Right ventricular systolic function is normal. The right ventricular size is normal. There is moderately elevated pulmonary artery systolic pressure. The estimated right ventricular systolic pressure is 75.9 mmHg.  3. The mitral valve is normal in structure. Mild to moderate mitral valve regurgitation. No evidence of mitral stenosis.  4. The aortic valve was not well visualized. Aortic valve regurgitation is not visualized. Aortic valve sclerosis/calcification is  present, without any evidence of aortic stenosis.  5. The inferior vena cava is normal in size with greater than 50% respiratory variability, suggesting right atrial pressure of 3 mmHg. FINDINGS  Left Ventricle: Left ventricular ejection fraction, by estimation, is 55 to 60%. The left ventricle has normal function. The left ventricle has no regional wall motion abnormalities. The left ventricular internal cavity size was normal in size. There is  mild left ventricular hypertrophy. Left ventricular diastolic parameters are consistent with Grade II diastolic dysfunction (pseudonormalization). Right Ventricle: The right ventricular size is normal. No increase in right ventricular wall thickness. Right ventricular systolic function is normal. There is moderately elevated pulmonary artery systolic pressure. The tricuspid regurgitant velocity is 3.24 m/s, and with an assumed right atrial pressure of 5 mmHg, the estimated right  ventricular systolic pressure is 58.0 mmHg. Left Atrium: Left atrial size was normal in size. Right Atrium: Right atrial size was normal in size. Pericardium: There is no evidence of pericardial effusion. Mitral Valve: The mitral valve is normal in structure. There is mild thickening of the mitral valve leaflet(s). There is mild calcification of the mitral valve leaflet(s). Mild mitral annular calcification. Mild to moderate mitral valve regurgitation. No  evidence of mitral valve stenosis. Tricuspid Valve: The tricuspid valve is normal in structure. Tricuspid valve regurgitation is mild . No evidence of tricuspid stenosis. Aortic Valve: The aortic valve was not well visualized. Aortic valve regurgitation is not visualized. Aortic valve sclerosis/calcification is present, without any evidence of aortic stenosis. Pulmonic Valve: The pulmonic valve was normal in structure. Pulmonic valve regurgitation is not visualized. No evidence of pulmonic stenosis. Aorta: The aortic root is normal in size and structure. Venous: The inferior vena cava is normal in size with greater than 50% respiratory variability, suggesting right atrial pressure of 3 mmHg. IAS/Shunts: No atrial level shunt detected by color flow Doppler.  LEFT VENTRICLE PLAX 2D LVIDd:         3.60 cm   Diastology LVIDs:         2.40 cm   LV e' medial:    6.31 cm/s LV PW:         1.20 cm   LV E/e' medial:  22.2 LV IVS:        1.20 cm   LV e' lateral:   7.62 cm/s LVOT diam:     1.80 cm   LV E/e' lateral: 18.4 LV SV:         45 LV SV Index:   25 LVOT Area:     2.54 cm  RIGHT VENTRICLE             IVC RV Basal diam:  3.60 cm     IVC diam: 1.30 cm RV S prime:     12.20 cm/s TAPSE (M-mode): 1.8 cm LEFT ATRIUM             Index        RIGHT ATRIUM           Index LA diam:        3.70 cm 2.06 cm/m   RA Area:     11.40 cm LA Vol (A2C):   40.8 ml 22.73 ml/m  RA Volume:   27.20 ml  15.15 ml/m LA Vol (A4C):   41.4 ml 23.07 ml/m LA Biplane Vol: 41.0 ml 22.84 ml/m   AORTIC VALVE LVOT Vmax:   81.65 cm/s LVOT Vmean:  61.800 cm/s LVOT VTI:    0.178 m  AORTA  Ao Root diam: 2.90 cm MITRAL VALVE                TRICUSPID VALVE MV Area (PHT): 4.31 cm     TR Peak grad:   42.0 mmHg MV Decel Time: 176 msec     TR Vmax:        324.00 cm/s MV E velocity: 140.00 cm/s MV A velocity: 105.00 cm/s  SHUNTS MV E/A ratio:  1.33         Systemic VTI:  0.18 m                             Systemic Diam: 1.80 cm Ida Rogue MD Electronically signed by Ida Rogue MD Signature Date/Time: 01/19/2021/10:20:25 AM    Final     Microbiology: Recent Results (from the past 240 hour(s))  Resp Panel by RT-PCR (Flu A&B, Covid) Nasopharyngeal Swab     Status: None   Collection Time: 01/19/2021  3:06 AM   Specimen: Nasopharyngeal Swab; Nasopharyngeal(NP) swabs in vial transport medium  Result Value Ref Range Status   SARS Coronavirus 2 by RT PCR NEGATIVE NEGATIVE Final    Comment: (NOTE) SARS-CoV-2 target nucleic acids are NOT DETECTED.  The SARS-CoV-2 RNA is generally detectable in upper respiratory specimens during the acute phase of infection. The lowest concentration of SARS-CoV-2 viral copies this assay can detect is 138 copies/mL. A negative result does not preclude SARS-Cov-2 infection and should not be used as the sole basis for treatment or other patient management decisions. A negative result may occur with  improper specimen collection/handling, submission of specimen other than nasopharyngeal swab, presence of viral mutation(s) within the areas targeted by this assay, and inadequate number of viral copies(<138 copies/mL). A negative result must be combined with clinical observations, patient history, and epidemiological information. The expected result is Negative.  Fact Sheet for Patients:  EntrepreneurPulse.com.au  Fact Sheet for Healthcare Providers:  IncredibleEmployment.be  This test is no t yet approved or cleared by the [place] FDA and  has been authorized for detection and/or diagnosis of SARS-CoV-2 by FDA under an Emergency Use Authorization (EUA). This EUA will remain  in effect (meaning this test can be used) for the duration of the COVID-19 declaration under Section 564(b)(1) of the Act, 21 U.S.C.section 360bbb-3(b)(1), unless the authorization is terminated  or revoked sooner.       Influenza A by PCR NEGATIVE NEGATIVE Final   Influenza B by PCR NEGATIVE NEGATIVE Final    Comment: (NOTE) The Xpert Xpress SARS-CoV-2/FLU/RSV plus assay is intended as an aid in the diagnosis of influenza from Nasopharyngeal swab specimens and should not be used as a sole basis for treatment. Nasal washings and aspirates are unacceptable for Xpert Xpress SARS-CoV-2/FLU/RSV testing.  Fact Sheet for Patients: EntrepreneurPulse.com.au  Fact Sheet for Healthcare Providers: IncredibleEmployment.be  This test is not yet approved or cleared by the [place] FDA and has been authorized for detection and/or diagnosis of SARS-CoV-2 by FDA under an Emergency Use Authorization (EUA). This EUA will remain in effect (meaning this test can be used) for the duration of the COVID-19 declaration under Section 564(b)(1) of the Act, 21 U.S.C. section 360bbb-3(b)(1), unless the authorization is terminated or revoked.  Performed at [facility], [address] [place],  [address]   CULTURE, BLOOD (ROUTINE X 2) w Reflex to ID Panel     Status: None (Preliminary result)   Collection Time: 01/19/21  1:49 PM   Specimen: BLOOD LEFT HAND  Result Value Ref Range Status   Specimen Description BLOOD LEFT HAND  Final   Special Requests   Final    BOTTLES DRAWN AEROBIC AND ANAEROBIC Blood Culture adequate volume   Culture   Final    NO GROWTH < 24 HOURS Performed at [facility], [address] [place], [place] [address]    Report Status PENDING  Incomplete   CULTURE, BLOOD (ROUTINE X 2) w Reflex to ID Panel     Status: None (Preliminary result)   Collection Time: 01/19/21  1:50 PM   Specimen: BLOOD LEFT HAND  Result Value Ref Range Status   Specimen Description BLOOD LEFT HAND  Final   Special Requests AEROBIC BOTTLE ONLY Blood Culture adequate volume  Final   Culture   Final    NO GROWTH < 24 HOURS Performed at [facility], [address], , [place] [address]    Report Status PENDING  Incomplete     Labs: Basic Metabolic Panel: Recent Labs  Lab 02/04/2021 0306 01/19/21 0913 01-23-21 0738  NA 136 139 141  K 4.3 4.2 4.4  CL 105 106 108  CO2 21* 22 20*  GLUCOSE 276* 244* 122*  BUN 48* 58* 75*  CREATININE 2.19* 2.48* 3.10*  CALCIUM 8.9 8.7* 8.3*  MG  --  2.3  --   PHOS  --  4.6  --    Liver Function Tests: Recent Labs  Lab Jan 23, 2021 0738  AST 34  ALT 25  ALKPHOS 51  BILITOT 0.7  PROT 7.1  ALBUMIN 3.1*   No results for input(s): LIPASE, AMYLASE in the last 168 hours. No results for input(s): AMMONIA in the last 168 hours. CBC: Recent Labs  Lab 01/17/2021 0306 01/19/21 0913 01-23-2021 0738  WBC 16.7* 22.1* 24.4*  NEUTROABS 14.4*  --   --   HGB 9.4* 8.0* 7.4*  HCT 29.2* 25.1* 23.2*  MCV 85.6 85.4 87.5  PLT 250 217 202   Cardiac Enzymes: No results for input(s): CKTOTAL, CKMB, CKMBINDEX, TROPONINI in the last 168 hours. D-Dimer Recent Labs    01/19/21 2240  DDIMER 1.29*   BNP: Invalid input(s): POCBNP CBG: Recent Labs  Lab 01/19/21 2113 January 23, 2021 0000 01/23/2021 0310 01-23-21 0735 23-Jan-2021 1220  GLUCAP 177* 204* 153* 121* 157*   Anemia work up Recent Labs    01/19/21 2240  VITAMINB12 1,480*  FERRITIN 463*  TIBC 213*  IRON 14*  RETICCTPCT 2.4   Urinalysis    Component Value Date/Time   COLORURINE YELLOW (A) 03/23/2020 1530   APPEARANCEUR CLOUDY (A) 03/23/2020 1530   LABSPEC 1.008 03/23/2020 1530   PHURINE 5.0 03/23/2020 1530   GLUCOSEU NEGATIVE 03/23/2020 1530   HGBUR  MODERATE (A) 03/23/2020 1530   BILIRUBINUR NEGATIVE 03/23/2020 1530   KETONESUR NEGATIVE 03/23/2020 1530   PROTEINUR 100 (A) 03/23/2020 1530   NITRITE NEGATIVE 03/23/2020 1530   LEUKOCYTESUR LARGE (A) 03/23/2020 1530   Sepsis Labs Invalid input(s): PROCALCITONIN,  WBC,  LACTICIDVEN     SIGNED:  Kayleen Memos, MD  [facility] 01-23-21, 4:09 PM Pager   If 7PM-7AM, please contact night-coverage [email or web address] Password [email or web address]
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
## 2021-02-15 NOTE — ED Notes (Addendum)
Dr. Nevada Crane and Dr. Lanney Gins notified pt's BP is 81/47, pt. Is having periods of unresponsiveness, and that pt's 3 sons and husband were at bedside. Dr. Lanney Gins states he is coming to see the pt. Pt's sons and spouse leave room to go to the bathroom.

## 2021-02-15 NOTE — ED Notes (Signed)
This RN and Dr. Lanney Gins notified pt's family in family room that pt. Was being coded, and actively passing. Dr. Lanney Gins and family decided to stop code and let pt. Pass.

## 2021-02-15 NOTE — ED Notes (Addendum)
PEA, no pulse

## 2021-02-15 NOTE — Progress Notes (Signed)
°   February 02, 2021 1520  Clinical Encounter Type  Visited With Patient and family together  Visit Type Death  Referral From Other (Comment) (code blue)  Spiritual Encounters  Spiritual Needs Grief support   Chaplain Burris responded to a code blue. Upon arrival the DNR status was assessed. Chaplain B coordinated with staff to determine their readiness for the family to be brought to the bedside. Chaplain Burris provided compassionate, non-anxious presence and helped family to gather. The chaplain assessed the family's needs and enabled the family to be present with Sheri Ryan and to grieve at the bedside. Chaplain Burris remained on the unit to respond if further needs arose.

## 2021-02-15 NOTE — ED Notes (Signed)
Dr. Lanney Gins notified pt is currently sating 76-79% and encouraged to discuss whether or not family wants pt. To be intubated or not.
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
## 2021-02-15 NOTE — Progress Notes (Signed)
[place], [place] 01-23-21  Subjective:   Hospital day # 2 Patient known to our practice from outpatient follow-up.  She is followed by Dr. Lanora Manis  Patient sitting up in bed Alert but disoriented Bipap in place Sons at bedside  Lasix drip at 8mg /hr, very little UOP Creatinine elevated at 3.1  Renal: 01/05 0701 - 01/06 0700 In: 209.6 [IV Piggyback:209.6] Out: 300 [Urine:300] Lab Results  Component Value Date   CREATININE 3.10 (H) 2021/01/23   CREATININE 2.48 (H) 01/19/2021   CREATININE 2.19 (H) 02/08/2021     Objective:  Vital signs in last 24 hours:  Pulse Rate:  [66-86] 66 (01/06 1200) Resp:  [20-40] 37 (01/06 1200) BP: (103-146)/(51-83) 108/55 (01/06 1200) SpO2:  [80 %-97 %] 80 % (01/06 1200) FiO2 (%):  [100 %] 100 % (01/06 0812)  Weight change:  Filed Weights   01/28/2021 0509  Weight: 71.2 kg    Intake/Output:    Intake/Output Summary (Last 24 hours) at January 23, 2021 1222 Last data filed at 01/23/2021 0730 Gross per 24 hour  Intake 674.58 ml  Output --  Net 674.58 ml     Physical Exam: General: Ill-appearing, laying in the bed  HEENT NIPPV mask in place  Pulm/lungs Bilateral diffuse crackles  CVS/Heart Regular rhythm, NSR  Abdomen:  Soft, nontender  Extremities: No edema bilaterally  Neurologic: Alert, oriented, able to answer questions  Skin: Warm, dry          Basic Metabolic Panel:  Recent Labs  Lab 01/28/2021 0306 01/19/21 0913 Jan 23, 2021 0738  NA 136 139 141  K 4.3 4.2 4.4  CL 105 106 108  CO2 21* 22 20*  GLUCOSE 276* 244* 122*  BUN 48* 58* 75*  CREATININE 2.19* 2.48* 3.10*  CALCIUM 8.9 8.7* 8.3*  MG  --  2.3  --   PHOS  --  4.6  --       CBC: Recent Labs  Lab 01/23/2021 0306 01/19/21 0913 2021-01-23 0738  WBC 16.7* 22.1* 24.4*  NEUTROABS 14.4*  --   --   HGB 9.4* 8.0* 7.4*  HCT 29.2* 25.1* 23.2*  MCV 85.6 85.4 87.5  PLT 250 217 202      No results found for: HEPBSAG, HEPBSAB,  HEPBIGM    Microbiology:  Recent Results (from the past 240 hour(s))  Resp Panel by RT-PCR (Flu A&B, Covid) Nasopharyngeal Swab     Status: None   Collection Time: 02/04/2021  3:06 AM   Specimen: Nasopharyngeal Swab; Nasopharyngeal(NP) swabs in vial transport medium  Result Value Ref Range Status   SARS Coronavirus 2 by RT PCR NEGATIVE NEGATIVE Final    Comment: (NOTE) SARS-CoV-2 target nucleic acids are NOT DETECTED.  The SARS-CoV-2 RNA is generally detectable in upper respiratory specimens during the acute phase of infection. The lowest concentration of SARS-CoV-2 viral copies this assay can detect is 138 copies/mL. A negative result does not preclude SARS-Cov-2 infection and should not be used as the sole basis for treatment or other patient management decisions. A negative result may occur with  improper specimen collection/handling, submission of specimen other than nasopharyngeal swab, presence of viral mutation(s) within the areas targeted by this assay, and inadequate number of viral copies(<138 copies/mL). A negative result must be combined with clinical observations, patient history, and epidemiological information. The expected result is Negative.  Fact Sheet for Patients:  EntrepreneurPulse.com.au  Fact Sheet for Healthcare Providers:  IncredibleEmployment.be  This test is no t yet approved or cleared by the  [place] Architectural [employer] and  has been authorized for detection and/or diagnosis of SARS-CoV-2 by FDA under an Print production planner (EUA). This EUA will remain  in effect (meaning this test can be used) for the duration of the COVID-19 declaration under Section 564(b)(1) of the Act, 21 U.S.C.section 360bbb-3(b)(1), unless the authorization is terminated  or revoked sooner.       Influenza A by PCR NEGATIVE NEGATIVE Final   Influenza B by PCR NEGATIVE NEGATIVE Final    Comment: (NOTE) The Xpert Xpress SARS-CoV-2/FLU/RSV  plus assay is intended as an aid in the diagnosis of influenza from Nasopharyngeal swab specimens and should not be used as a sole basis for treatment. Nasal washings and aspirates are unacceptable for Xpert Xpress SARS-CoV-2/FLU/RSV testing.  Fact Sheet for Patients: EntrepreneurPulse.com.au  Fact Sheet for Healthcare Providers: IncredibleEmployment.be  This test is not yet approved or cleared by the [place] FDA and has been authorized for detection and/or diagnosis of SARS-CoV-2 by FDA under an Emergency Use Authorization (EUA). This EUA will remain in effect (meaning this test can be used) for the duration of the COVID-19 declaration under Section 564(b)(1) of the Act, 21 U.S.C. section 360bbb-3(b)(1), unless the authorization is terminated or revoked.  Performed at [facility], [address] [place], [place] [address]   CULTURE, BLOOD (ROUTINE X 2) w Reflex to ID Panel     Status: None (Preliminary result)   Collection Time: 01/19/21  1:49 PM   Specimen: BLOOD LEFT HAND  Result Value Ref Range Status   Specimen Description BLOOD LEFT HAND  Final   Special Requests   Final    BOTTLES DRAWN AEROBIC AND ANAEROBIC Blood Culture adequate volume   Culture   Final    NO GROWTH < 24 HOURS Performed at [facility], [address] [place], [place] [address]    Report Status PENDING  Incomplete  CULTURE, BLOOD (ROUTINE X 2) w Reflex to ID Panel     Status: None (Preliminary result)   Collection Time: 01/19/21  1:50 PM   Specimen: BLOOD LEFT HAND  Result Value Ref Range Status   Specimen Description BLOOD LEFT HAND  Final   Special Requests AEROBIC BOTTLE ONLY Blood Culture adequate volume  Final   Culture   Final    NO GROWTH < 24 HOURS Performed at [facility], [address] [place], [place] [address]    Report Status PENDING  Incomplete    Coagulation Studies: Recent Labs    01/29/2021 0455   LABPROT 16.7*  INR 1.4*     Urinalysis: No results for input(s): COLORURINE, LABSPEC, PHURINE, GLUCOSEU, HGBUR, BILIRUBINUR, KETONESUR, PROTEINUR, UROBILINOGEN, NITRITE, LEUKOCYTESUR in the last 72 hours.  Invalid input(s): APPERANCEUR    Imaging: DG Chest Port 1 View  Result Date: 02/03/2021 CLINICAL DATA:  Hypoxia. EXAM: PORTABLE CHEST 1 VIEW COMPARISON:  Chest XR 01/19/2021 and 01/19/2021. FINDINGS: Cardiomegaly. Hypoinflation. Worsening perihilar basilar interstitial edema. No pleural effusion. No interval osseous abnormality. IMPRESSION: Cardiomegaly with worsened coarse perihilar interstitial opacities. Findings consistent with worsening pulmonary edema. Electronically Signed   By: Michaelle Birks M.D.   On: Feb 03, 2021 07:49   DG Chest Port 1 View  Result Date: 01/19/2021 CLINICAL DATA:  Shortness of breath EXAM: PORTABLE CHEST 1 VIEW COMPARISON:  Previous studies including the examination of 02/10/2021 FINDINGS: Transverse diameter of heart is increased. Central pulmonary vessels are more prominent. Diffuse interstitial and alveolar densities seen in both lungs. There is blunting of both lateral  CP angles. There is no pneumothorax. IMPRESSION: Cardiomegaly. There is interval worsening of pulmonary vascular congestion and pulmonary edema. Possibility of underlying pneumonia is not excluded. Small bilateral pleural effusions. Electronically Signed   By: Elmer Picker M.D.   On: 01/19/2021 08:27   ECHOCARDIOGRAM COMPLETE  Result Date: 01/19/2021    ECHOCARDIOGRAM REPORT   Patient Name:   Sheri Ryan Date of Exam: 01/27/2021 Medical Rec #:  [id number]                 Height:       65.5 in Accession #:    [id number]                Weight:       157.0 lb Date of Birth:  13-Jul-1929                 BSA:          1.795 m Patient Age:    63 years                  BP:           156/66 mmHg Patient Gender: F                         HR:           80 bpm. Exam Location:  [facility] Procedure:  2D Echo, Cardiac Doppler and Color Doppler Indications:     I21.4 NSTEMI  History:         Patient has prior history of Echocardiogram examinations, most                  recent 03/20/2020. Risk Factors:Diabetes and Hypertension.                  Chronic kidney disease.  Sonographer:     Cresenciano Lick RDCS Referring Phys:  [id number] Arvella Merles MANSY Diagnosing Phys: Ida Rogue MD IMPRESSIONS  1. Left ventricular ejection fraction, by estimation, is 55 to 60%. The left ventricle has normal function. The left ventricle has no regional wall motion abnormalities. There is mild left ventricular hypertrophy. Left ventricular diastolic parameters are consistent with Grade II diastolic dysfunction (pseudonormalization).  2. Right ventricular systolic function is normal. The right ventricular size is normal. There is moderately elevated pulmonary artery systolic pressure. The estimated right ventricular systolic pressure is 79.4 mmHg.  3. The mitral valve is normal in structure. Mild to moderate mitral valve regurgitation. No evidence of mitral stenosis.  4. The aortic valve was not well visualized. Aortic valve regurgitation is not visualized. Aortic valve sclerosis/calcification is present, without any evidence of aortic stenosis.  5. The inferior vena cava is normal in size with greater than 50% respiratory variability, suggesting right atrial pressure of 3 mmHg. FINDINGS  Left Ventricle: Left ventricular ejection fraction, by estimation, is 55 to 60%. The left ventricle has normal function. The left ventricle has no regional wall motion abnormalities. The left ventricular internal cavity size was normal in size. There is  mild left ventricular hypertrophy. Left ventricular diastolic parameters are consistent with Grade II diastolic dysfunction (pseudonormalization). Right Ventricle: The right ventricular size is normal. No increase in right ventricular wall thickness. Right ventricular systolic function is normal.  There is moderately elevated pulmonary artery systolic pressure. The tricuspid regurgitant velocity is 3.24 m/s, and with an assumed right atrial pressure of 5 mmHg, the estimated right ventricular systolic pressure is  47.0 mmHg. Left Atrium: Left atrial size was normal in size. Right Atrium: Right atrial size was normal in size. Pericardium: There is no evidence of pericardial effusion. Mitral Valve: The mitral valve is normal in structure. There is mild thickening of the mitral valve leaflet(s). There is mild calcification of the mitral valve leaflet(s). Mild mitral annular calcification. Mild to moderate mitral valve regurgitation. No  evidence of mitral valve stenosis. Tricuspid Valve: The tricuspid valve is normal in structure. Tricuspid valve regurgitation is mild . No evidence of tricuspid stenosis. Aortic Valve: The aortic valve was not well visualized. Aortic valve regurgitation is not visualized. Aortic valve sclerosis/calcification is present, without any evidence of aortic stenosis. Pulmonic Valve: The pulmonic valve was normal in structure. Pulmonic valve regurgitation is not visualized. No evidence of pulmonic stenosis. Aorta: The aortic root is normal in size and structure. Venous: The inferior vena cava is normal in size with greater than 50% respiratory variability, suggesting right atrial pressure of 3 mmHg. IAS/Shunts: No atrial level shunt detected by color flow Doppler.  LEFT VENTRICLE PLAX 2D LVIDd:         3.60 cm   Diastology LVIDs:         2.40 cm   LV e' medial:    6.31 cm/s LV PW:         1.20 cm   LV E/e' medial:  22.2 LV IVS:        1.20 cm   LV e' lateral:   7.62 cm/s LVOT diam:     1.80 cm   LV E/e' lateral: 18.4 LV SV:         45 LV SV Index:   25 LVOT Area:     2.54 cm  RIGHT VENTRICLE             IVC RV Basal diam:  3.60 cm     IVC diam: 1.30 cm RV S prime:     12.20 cm/s TAPSE (M-mode): 1.8 cm LEFT ATRIUM             Index        RIGHT ATRIUM           Index LA diam:        3.70  cm 2.06 cm/m   RA Area:     11.40 cm LA Vol (A2C):   40.8 ml 22.73 ml/m  RA Volume:   27.20 ml  15.15 ml/m LA Vol (A4C):   41.4 ml 23.07 ml/m LA Biplane Vol: 41.0 ml 22.84 ml/m  AORTIC VALVE LVOT Vmax:   81.65 cm/s LVOT Vmean:  61.800 cm/s LVOT VTI:    0.178 m  AORTA Ao Root diam: 2.90 cm MITRAL VALVE                TRICUSPID VALVE MV Area (PHT): 4.31 cm     TR Peak grad:   42.0 mmHg MV Decel Time: 176 msec     TR Vmax:        324.00 cm/s MV E velocity: 140.00 cm/s MV A velocity: 105.00 cm/s  SHUNTS MV E/A ratio:  1.33         Systemic VTI:  0.18 m                             Systemic Diam: 1.80 cm Ida Rogue MD Electronically signed by Ida Rogue MD Signature Date/Time: 01/19/2021/10:20:25 AM    Final  Medications:    sodium chloride     azithromycin Stopped (01/19/21 2205)   cefTRIAXone (ROCEPHIN)  IV Stopped (01/19/21 2105)   dexmedetomidine (PRECEDEX) IV infusion     furosemide (LASIX) 200 mg in dextrose 5% 100 mL (2mg /mL) infusion 8 mg/hr (2021/02/12 0747)   norepinephrine (LEVOPHED) Adult infusion      aspirin  300 mg Rectal Daily   atorvastatin  80 mg Oral Daily   insulin aspart  0-9 Units Subcutaneous Q4H   nebivolol  2.5 mg Oral Daily   potassium chloride  10 mEq Oral BID   acetaminophen **OR** acetaminophen, ipratropium-albuterol, magnesium hydroxide, ondansetron **OR** ondansetron (ZOFRAN) IV, traZODone  Assessment/ Plan:  88 y.o. female with diabetes type 2, hypertension, left eye blindness,Coronary artery disease, congestive heart failure, peripheral vascular disease, anemia, proteinuria and CKD   admitted on 02/08/2021 for Acute respiratory failure (Alex) [J96.00]  #Chronic kidney disease stage IV with diabetic nephropathy CKD likely secondary to diabetes, hypertension, atherosclerosis and advanced age Baseline creatinine of 2.2, GFR 25 from December 12, 2020 Creatinine increased to 3.1 with Lasix drip at 8mg /hr Minimal urine output recorded.   #Acute  respiratory distress/failure secondary to acute pulmonary edema 2D echo from March 2022 shows LVEF 55 to 60%, mild LVH, indeterminate ventricular diastolic parameters, moderate pulmonary hypertension, moderate mitral regurgitation  2D echo done on Jan 18, 2021, - results pending SARS CoV-2, influ A and B are neg CXR 01/19/21- cardiomegaly with worsening vascular congestion and pulm edema. Underlying pneumonia not excluded, IV lasix infusion @ 4 mg/hr. Minimal UOP recorded. Bipap remains in place.Rapid desat if removed.   Family actively discussing comfort measures and awaiting additional family arrival. Orders placed for Precedex drip for comfort.     LOS: 2 Colon Flattery 01-29-202312:22 PM  [place], [place]  Note: This note was prepared with Dragon dictation. Any transcription errors are unintentional

## 2021-02-15 DEATH — deceased

## 2022-03-16 IMAGING — US US RENAL ARTERY STENOSIS
1 series · 14 of 25 positions shown · non-contrast
Comparison: None.

CLINICAL DATA: 89-year-old female with hypertension

EXAM:
RENAL/URINARY TRACT ULTRASOUND
RENAL DUPLEX DOPPLER ULTRASOUND

[Series 1: us renal artery stenosis · 0.23mm/px · 14 of 50 slices shown]
[im 1/50]
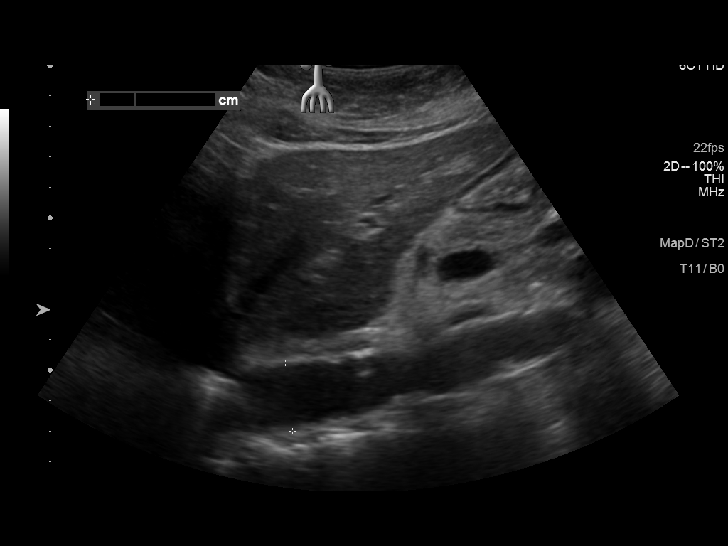
[im 5/50]
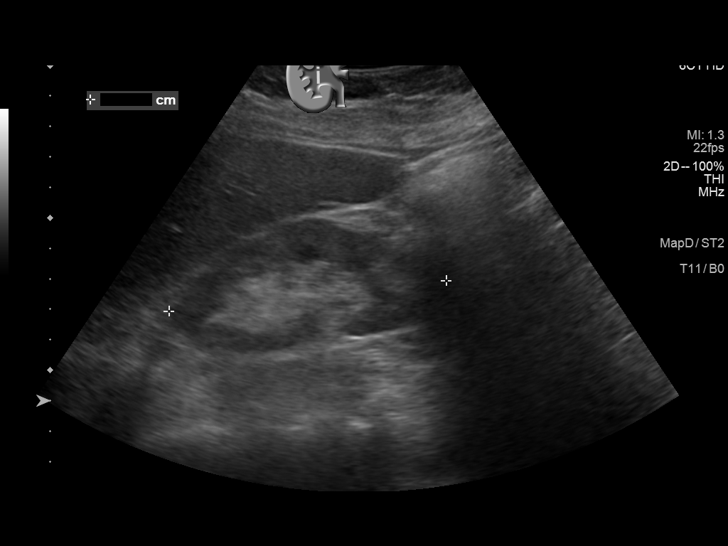
[im 9/50]
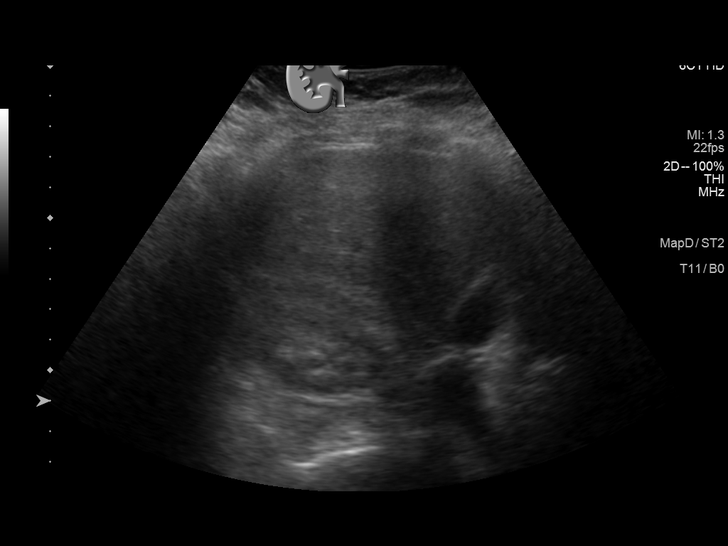
[im 13/50]
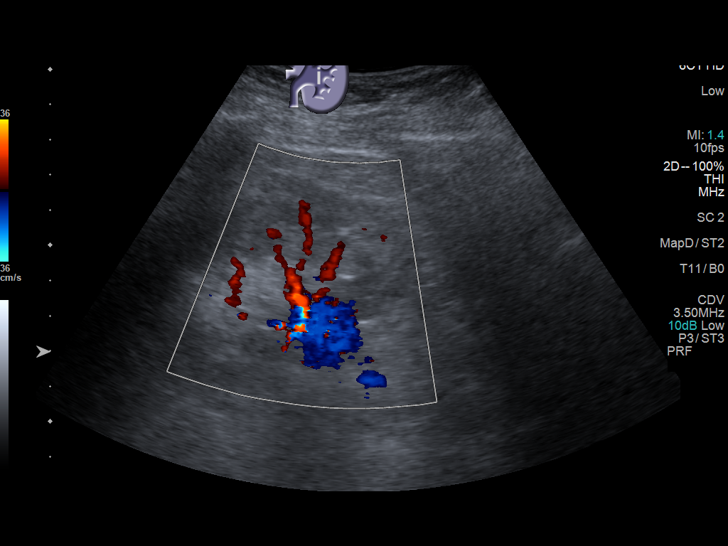
[im 17/50]
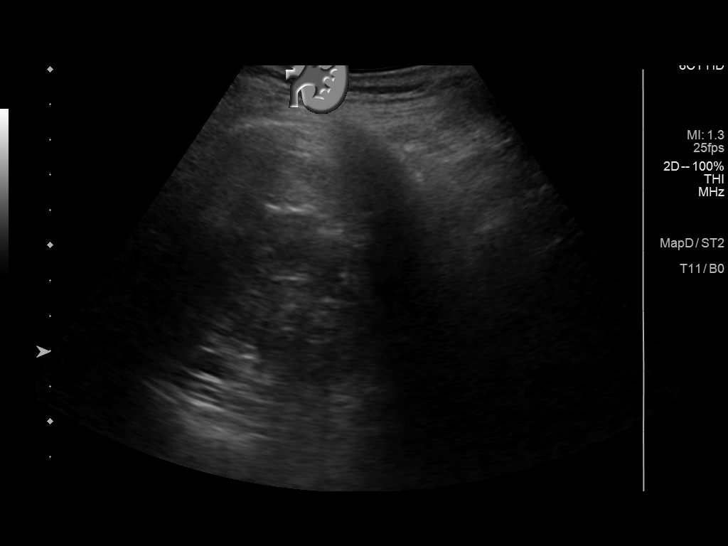
[im 19/50]
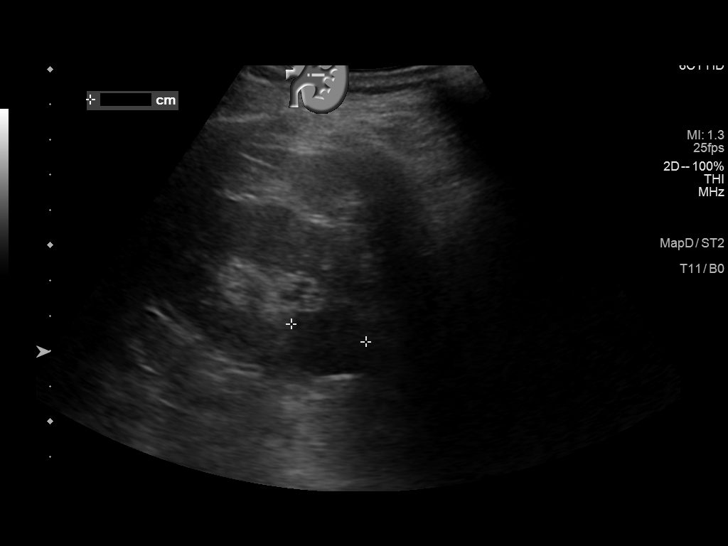
[im 23/50]
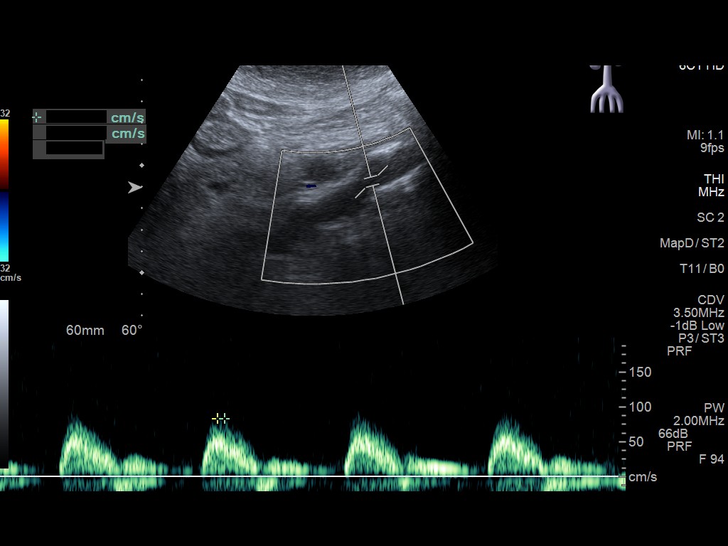
[im 27/50]
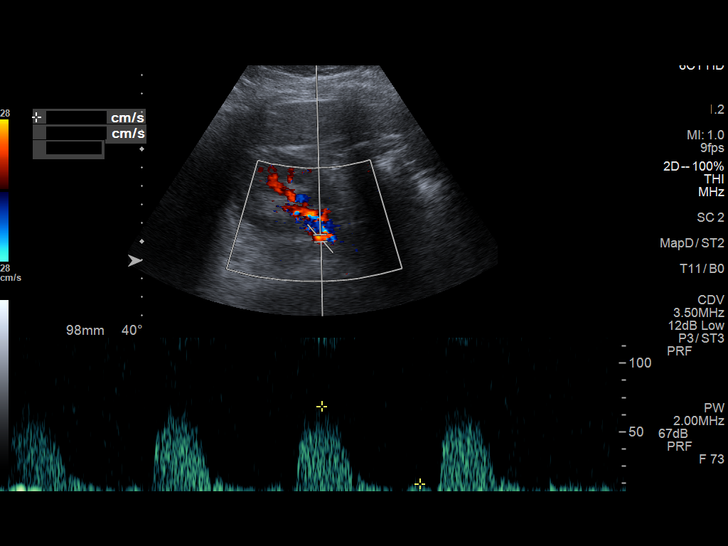
[im 31/50]
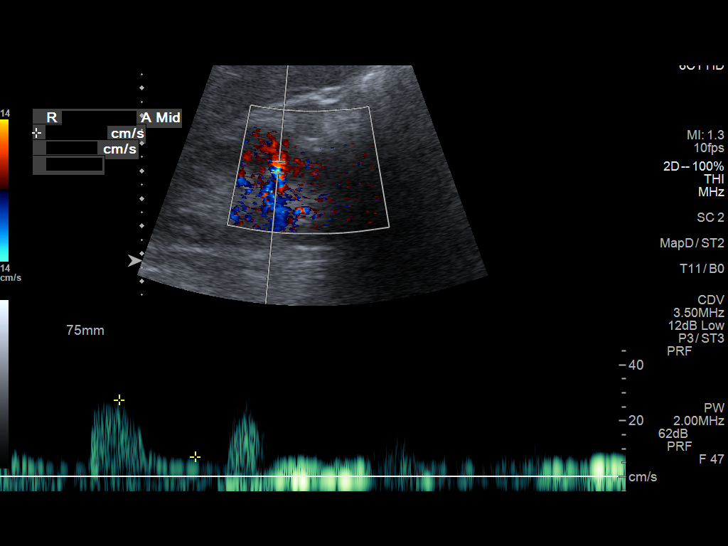
[im 33/50]
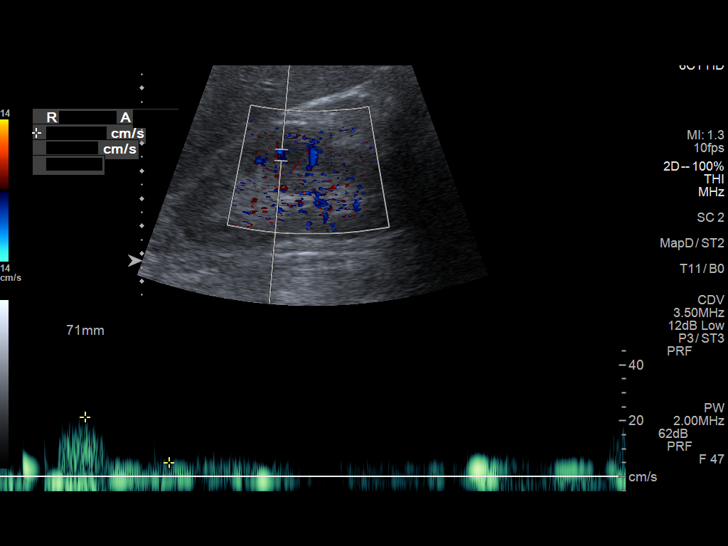
[im 37/50]
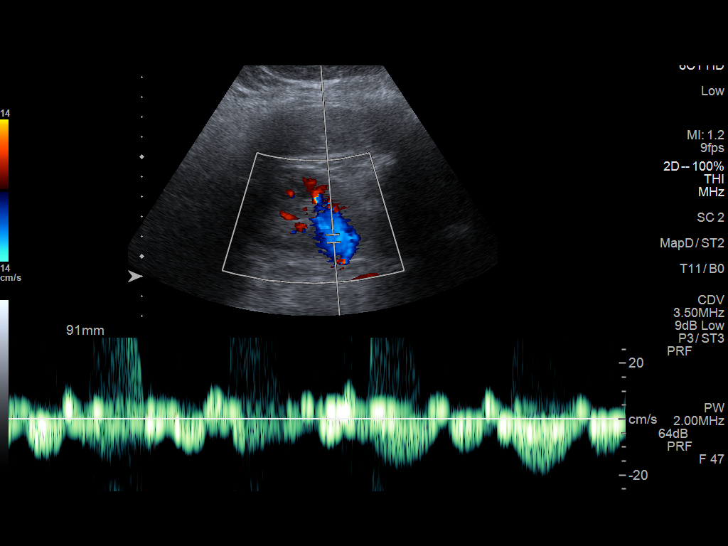
[im 41/50]
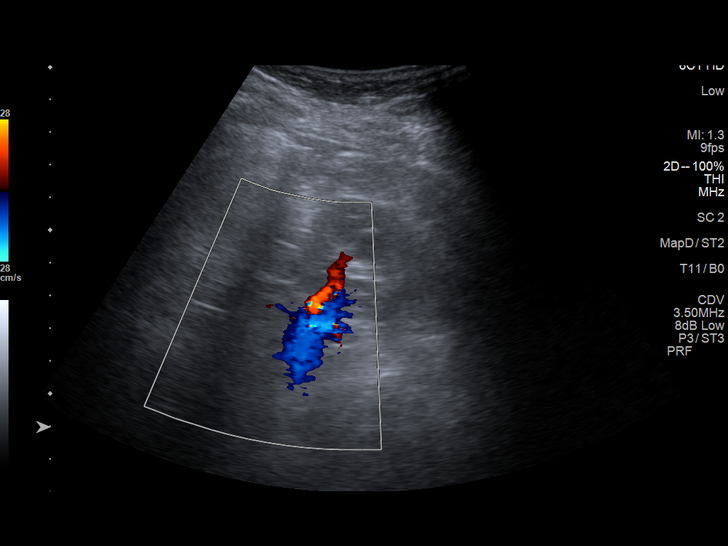
[im 45/50]
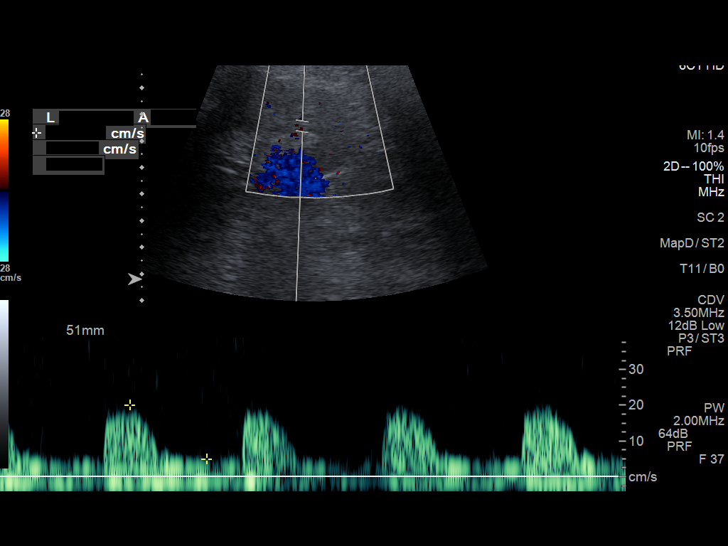
[im 50/50]
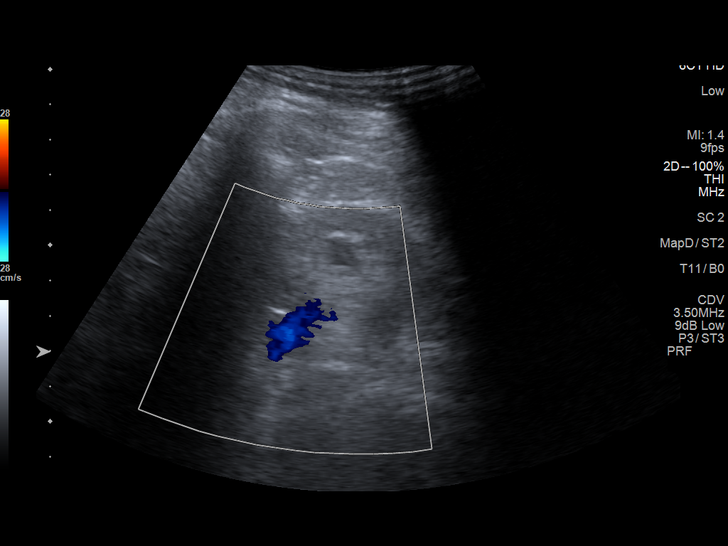

[14 of 25 positions shown; findings below may reference images not displayed]

FINDINGS: Right Kidney:

Length: 9.2 cm. Echogenicity within normal limits. No mass or
hydronephrosis visualized.

Left Kidney:

Length: 8.7 cm. Anechoic cystic lesion measures 2.2 cm, most
compatible with benign cyst. No hydronephrosis.

Bladder:  Unremarkable

RENAL DUPLEX ULTRASOUND

Right Renal Artery Velocities:

Origin:  106 cm/sec

Mid:  69 cm/sec

Hilum:  66 cm/sec

Interlobar:  27 cm/sec

Arcuate:  21 cm/sec

Left Renal Artery Velocities:

Origin:  95 cm/sec

Mid:  84 cm/sec

Hilum:  57 cm/sec

Interlobar:  31 cm/sec

Arcuate:  19 cm/sec

Aortic Velocity:  84 cm/sec

Right Renal-Aortic Ratios:

Origin:

Mid:

Hilum:

Interlobar:

Arcuate:

Left Renal-Aortic Ratios:

Origin:

Mid:

Hilum:

Interlobar:

Arcuate:
IMPRESSION: Directed duplex of the renal arteries demonstrates no evidence of
high-grade stenosis.

## 2022-04-29 IMAGING — DX DG CHEST 1V PORT
1 series · 1 of 1 positions shown · non-contrast
Comparison: February 17, 2016

CLINICAL DATA: Shortness of breath.

EXAM:
PORTABLE CHEST 1 VIEW

[chest ap]
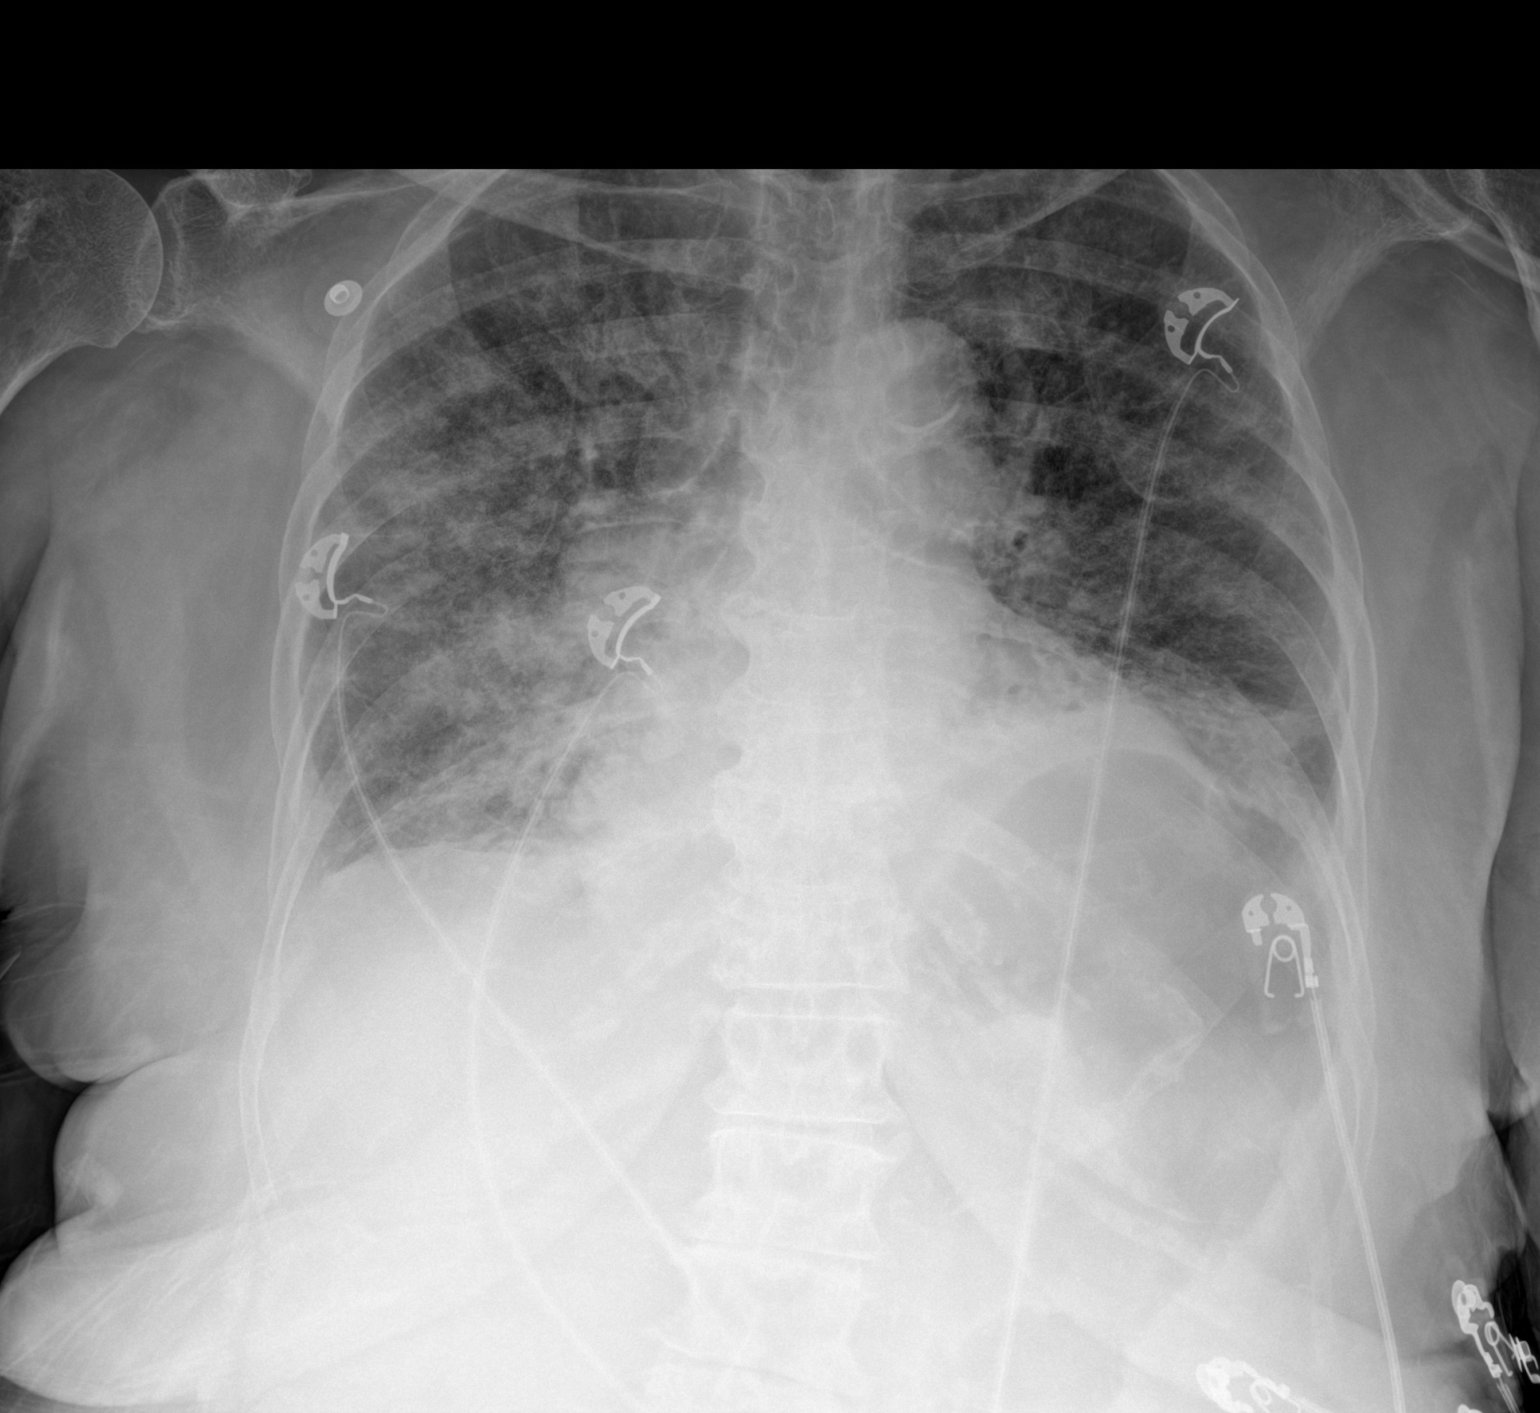

[1 of 1 positions shown; findings below may reference images not displayed]

FINDINGS: No pneumothorax. Stable cardiomegaly. The hila and mediastinum are
unchanged. No nodules or masses. Bilateral patchy pulmonary
infiltrates, right greater than left. No other acute abnormalities.
IMPRESSION: New bilateral patchy pulmonary infiltrates, right greater than left
could represent asymmetric edema versus developing multifocal
pneumonia. Recommend clinical correlation and attention on
follow-up.

## 2022-04-30 IMAGING — DX DG CHEST 1V PORT
1 series · 1 of 1 positions shown · non-contrast
Comparison: 03/19/2020

CLINICAL DATA: Shortness of breath, history of diabetes,
hypertension

EXAM:
PORTABLE CHEST 1 VIEW

[chest ap]
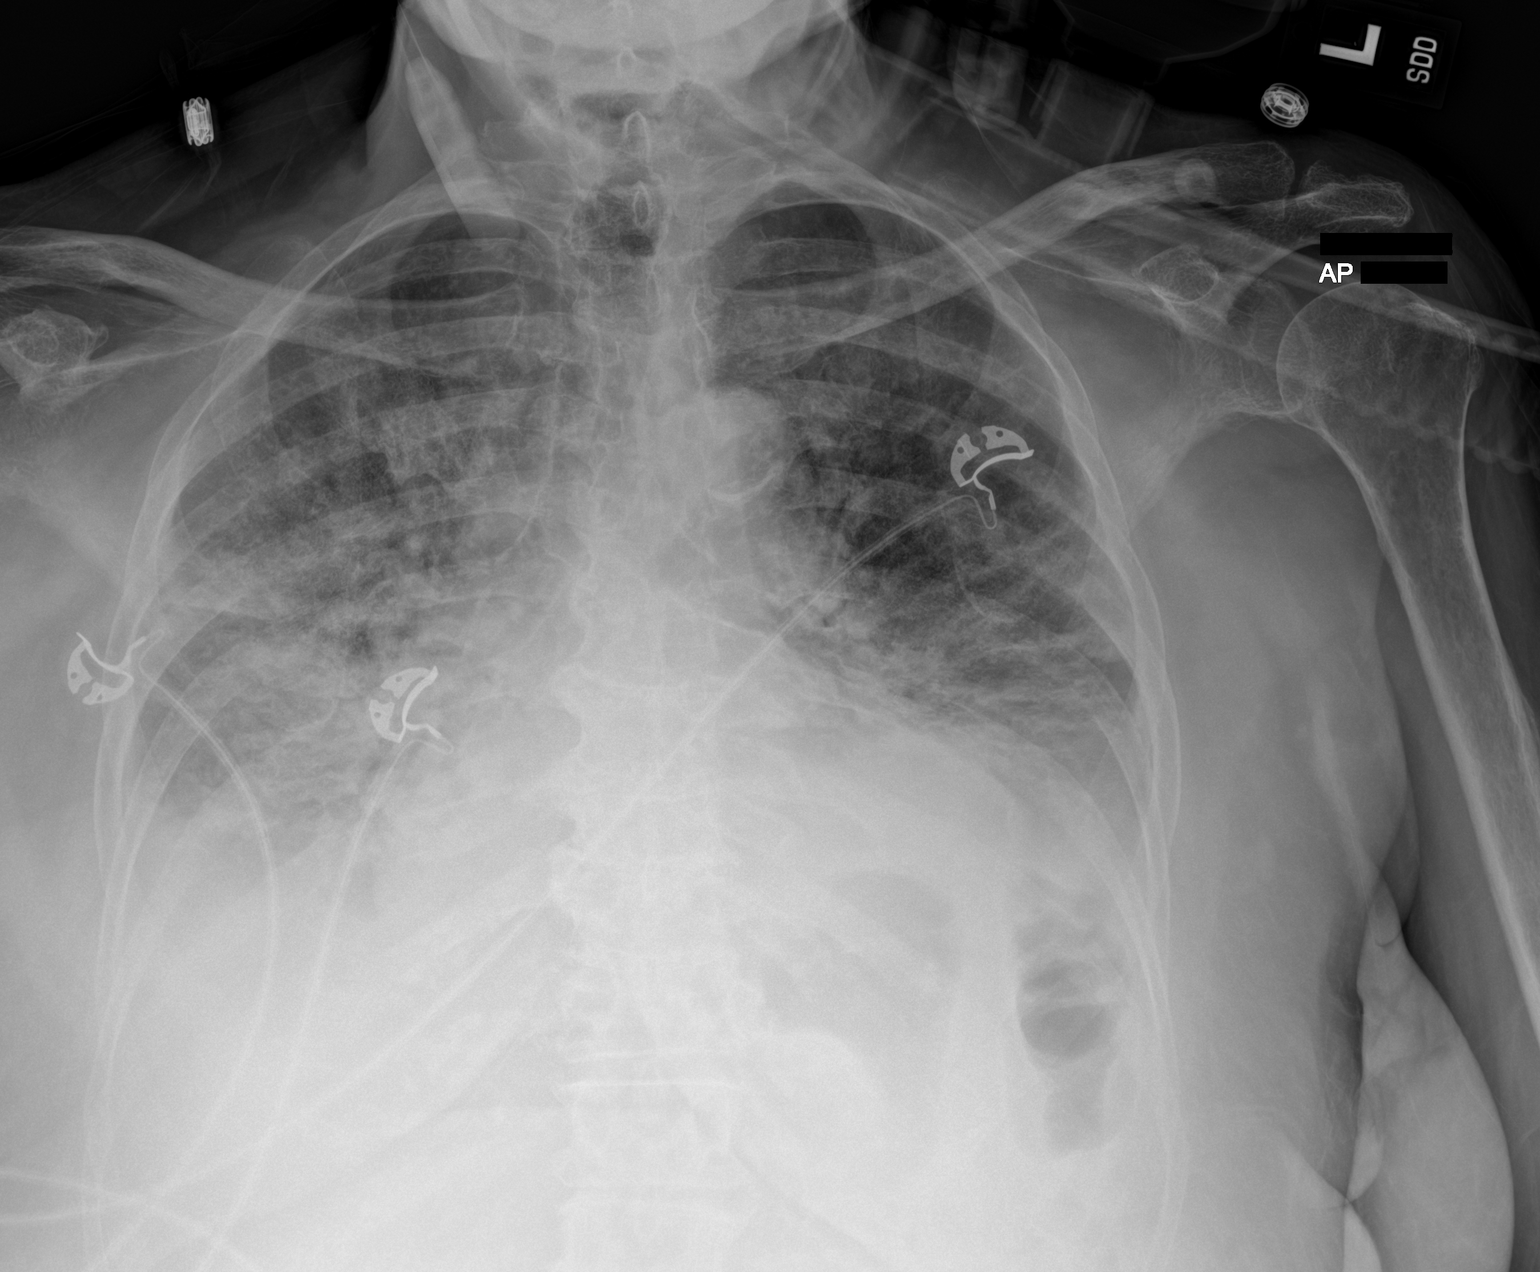

[1 of 1 positions shown; findings below may reference images not displayed]

FINDINGS: Increasing opacities in the mid to lower lungs including some more
veiling opacity bilaterally which could reflect developing or
enlarging layering effusions or worsening airspace disease. No
pneumothorax. Stable cardiomediastinal contours when compared to
prior. Calcified tortuous aorta. No acute osseous or soft tissue
abnormality. Telemetry leads overlie the chest.
IMPRESSION: Increasing opacities in the mid to lower lungs including some more
veiling opacity bilaterally may reflect a combination developing
effusions and worsening airspace disease either infection or edema.

Stable cardiomegaly.

Aortic Atherosclerosis ([id number]).

## 2022-05-01 IMAGING — DX DG CHEST 1V PORT
1 series · 1 of 1 positions shown · non-contrast
Comparison: Chest radiograph from one day prior.

CLINICAL DATA: Dyspnea, pulmonary edema

EXAM:
PORTABLE CHEST 1 VIEW

[chest ap]
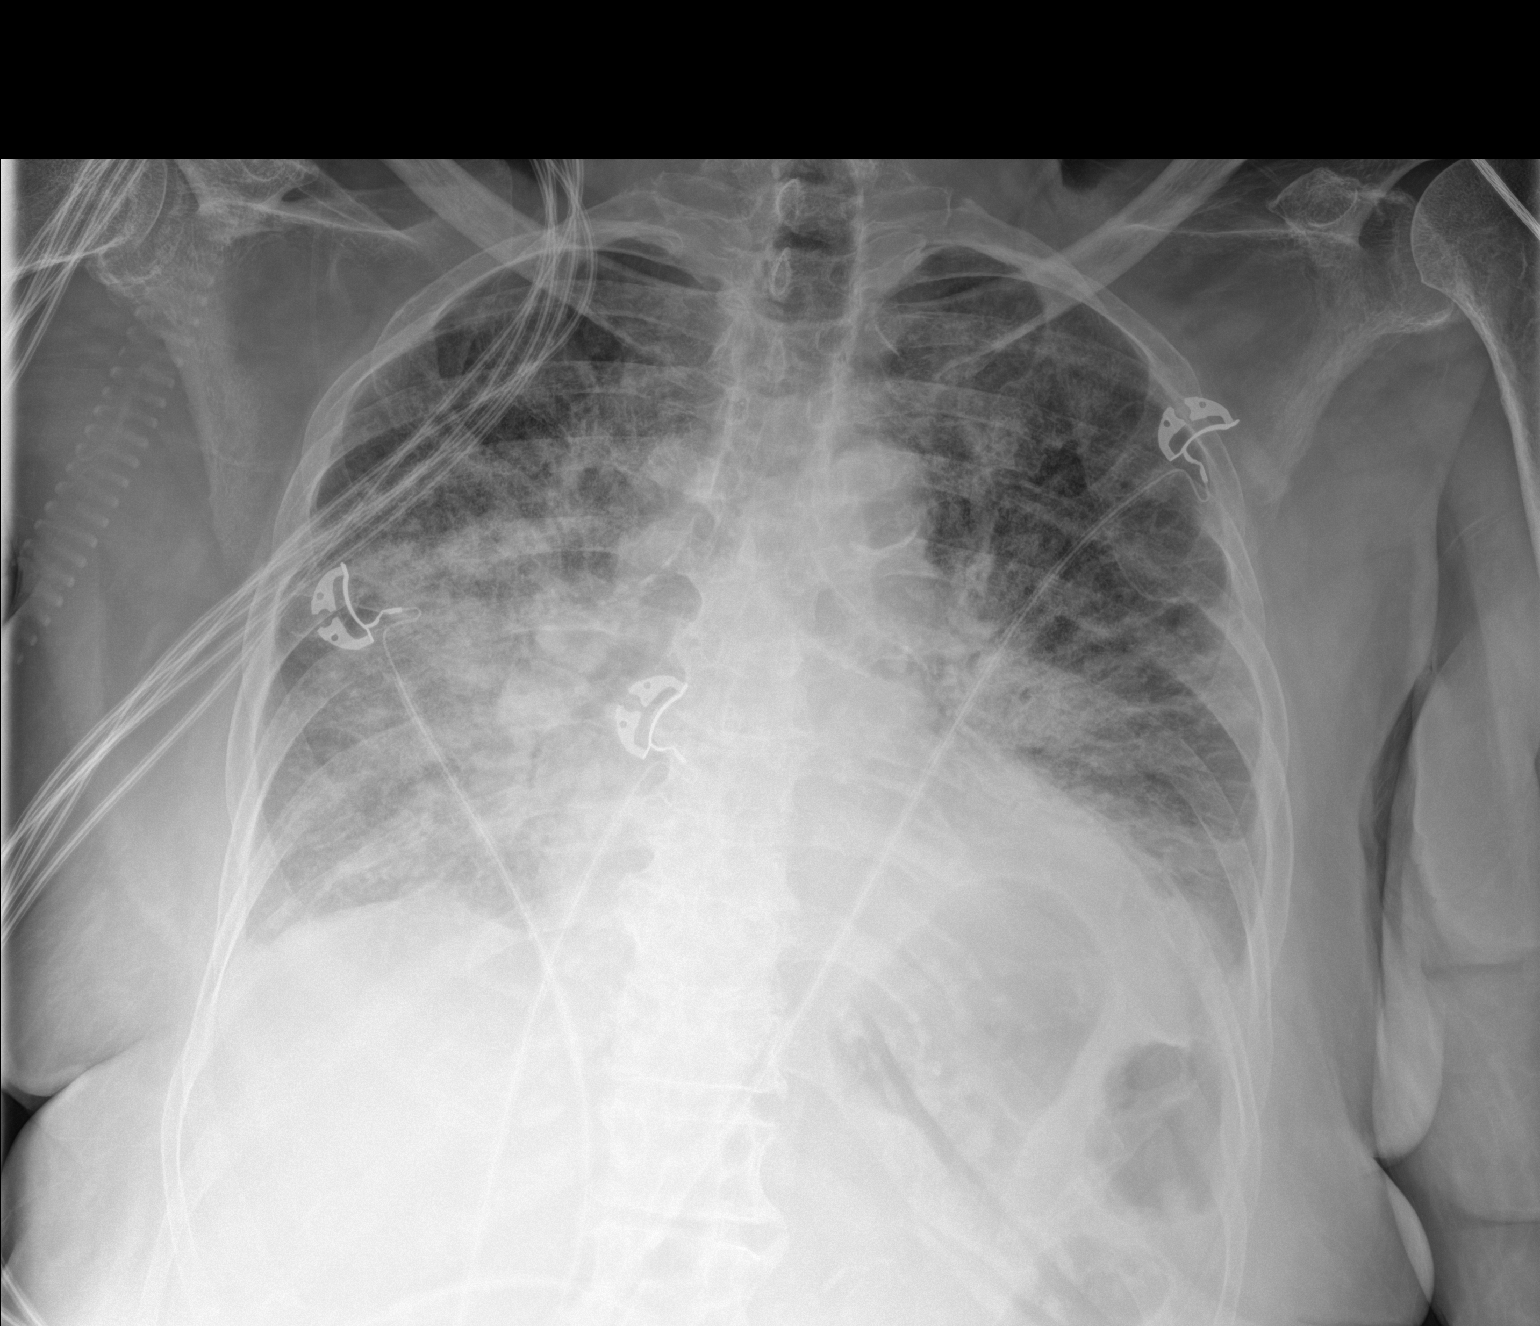

[1 of 1 positions shown; findings below may reference images not displayed]

FINDINGS: Stable cardiomediastinal silhouette with top-normal heart size. No
pneumothorax. Trace right pleural effusion, decreased. Small left
pleural effusion, decreased. Extensive patchy opacity throughout
both lungs, predominantly parahilar, not substantially changed,
noting overall improved lung volumes.
IMPRESSION: 1. Decreased trace right and small left pleural effusions.
2. Stable extensive patchy opacity throughout both lungs,
predominantly parahilar, differential includes multifocal pneumonia
or persistent pulmonary edema.

## 2022-05-03 IMAGING — DX DG CHEST 1V PORT
1 series · 1 of 1 positions shown · non-contrast
Comparison: 03/21/2020.

CLINICAL DATA: Pulmonary edema.

EXAM:
PORTABLE CHEST 1 VIEW

[chest ap]
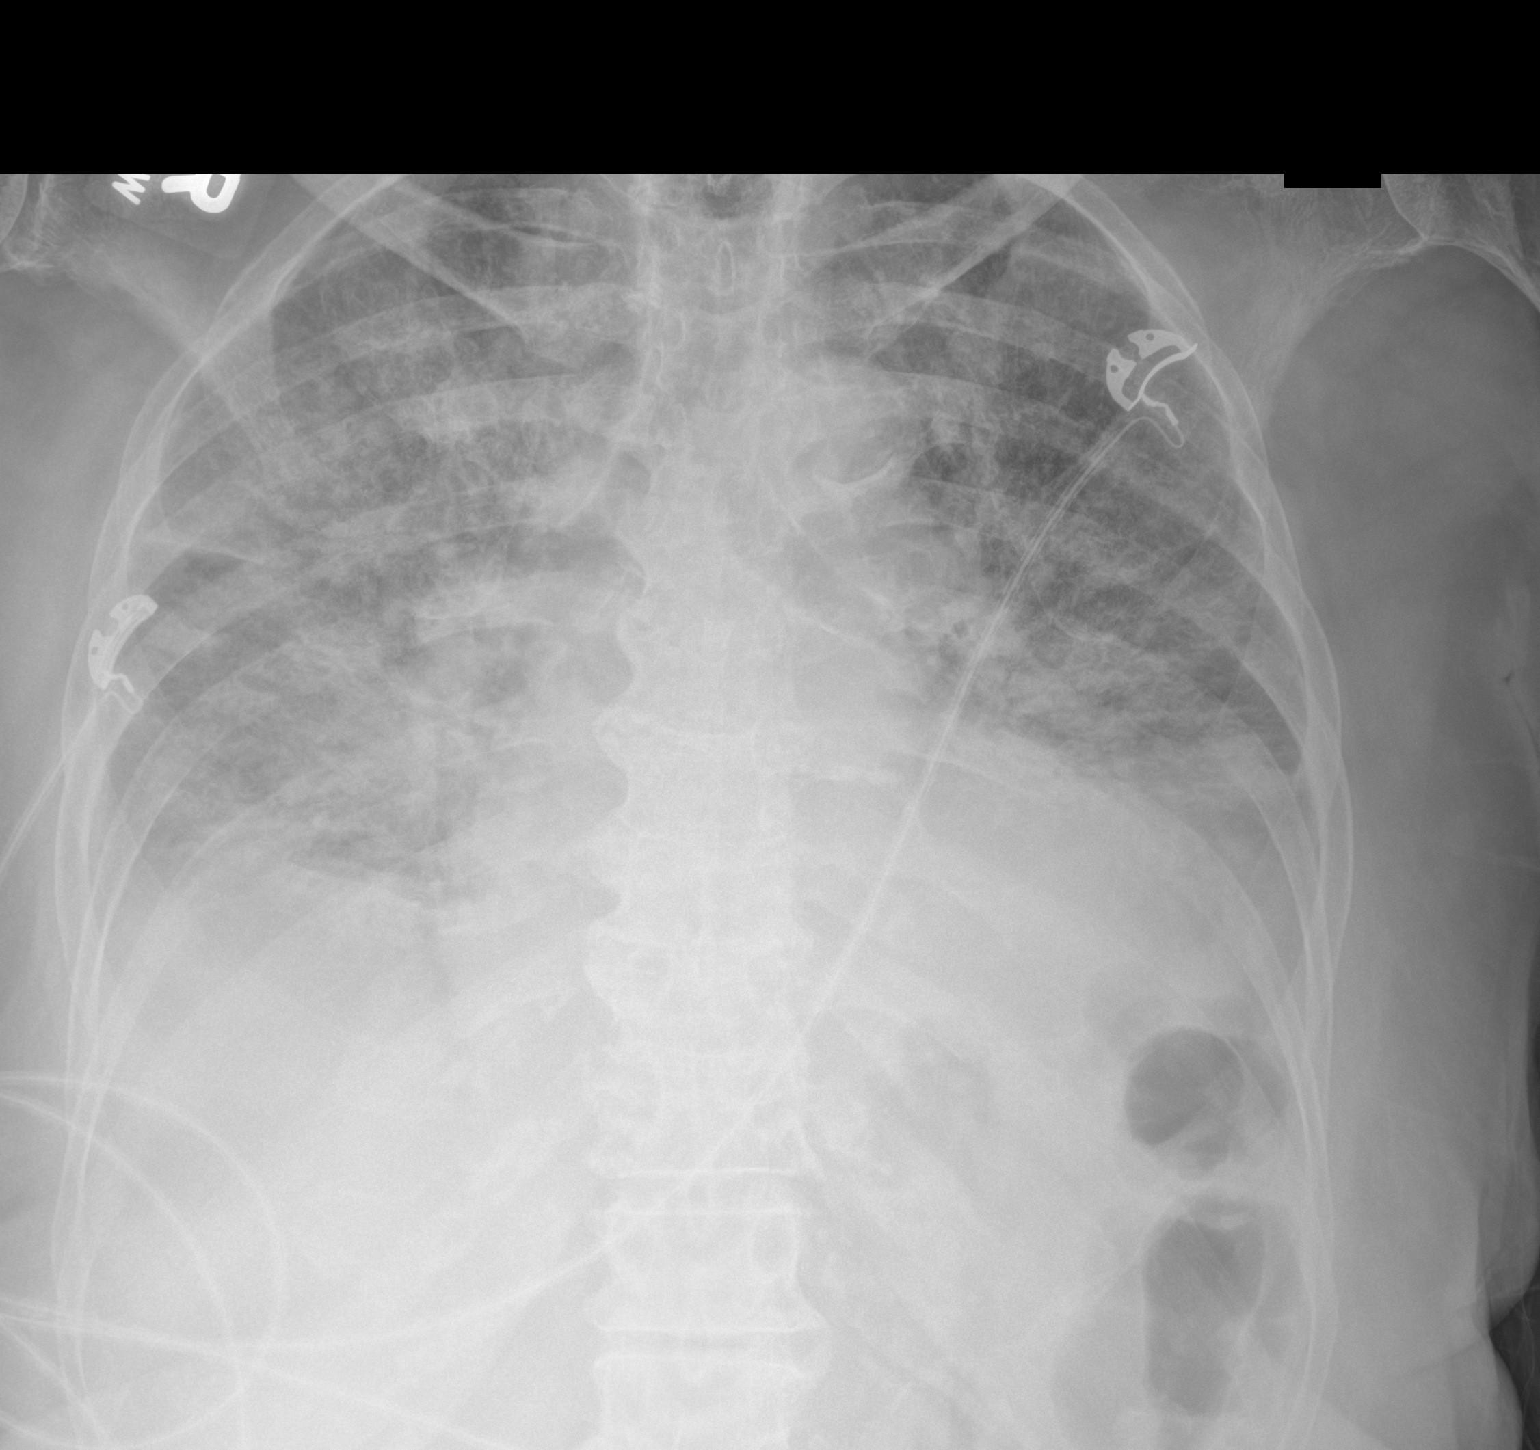

[1 of 1 positions shown; findings below may reference images not displayed]

FINDINGS: Cardiomegaly again noted. Diffuse severe bilateral pulmonary
infiltrates/edema again noted. Low lung volumes. Small bilateral
pleural effusions cannot be excluded. No pneumothorax. Degenerative
change thoracic spine.
IMPRESSION: 1. Cardiomegaly again noted.
2. Diffuse severe bilateral pulmonary infiltrates/edema again noted.
Small bilateral pleural effusions cannot be excluded.
